# Patient Record
Sex: Male | Born: 1975 | Race: Black or African American | Hispanic: No | Marital: Married | Smoking: Never smoker
Health system: Southern US, Community
[De-identification: ages and names within clinical notes are randomized; demographics above are authoritative.]

## PROBLEM LIST (undated history)

## (undated) DIAGNOSIS — E785 Hyperlipidemia, unspecified: Secondary | ICD-10-CM

## (undated) DIAGNOSIS — G4733 Obstructive sleep apnea (adult) (pediatric): Secondary | ICD-10-CM

## (undated) DIAGNOSIS — G479 Sleep disorder, unspecified: Secondary | ICD-10-CM

## (undated) DIAGNOSIS — Z9989 Dependence on other enabling machines and devices: Secondary | ICD-10-CM

## (undated) DIAGNOSIS — I10 Essential (primary) hypertension: Secondary | ICD-10-CM

## (undated) DIAGNOSIS — E119 Type 2 diabetes mellitus without complications: Secondary | ICD-10-CM

## (undated) DIAGNOSIS — R7302 Impaired glucose tolerance (oral): Secondary | ICD-10-CM

## (undated) DIAGNOSIS — G4730 Sleep apnea, unspecified: Secondary | ICD-10-CM

## (undated) HISTORY — DX: Impaired glucose tolerance (oral): R73.02

## (undated) HISTORY — DX: Dependence on other enabling machines and devices: Z99.89

## (undated) HISTORY — DX: Obstructive sleep apnea (adult) (pediatric): G47.33

## (undated) HISTORY — DX: Sleep apnea, unspecified: G47.30

## (undated) HISTORY — DX: Sleep disorder, unspecified: G47.9

## (undated) HISTORY — PX: NO PAST SURGERIES: SHX2092

## (undated) HISTORY — DX: Type 2 diabetes mellitus without complications: E11.9

## (undated) HISTORY — DX: Essential (primary) hypertension: I10

## (undated) HISTORY — DX: Hyperlipidemia, unspecified: E78.5

---

## 1999-02-11 ENCOUNTER — Emergency Department (HOSPITAL_COMMUNITY): Admission: EM | Admit: 1999-02-11 | Discharge: 1999-02-11 | Payer: Self-pay | Admitting: Emergency Medicine

## 1999-02-11 ENCOUNTER — Encounter: Payer: Self-pay | Admitting: Emergency Medicine

## 2006-02-12 ENCOUNTER — Emergency Department (HOSPITAL_COMMUNITY): Admission: EM | Admit: 2006-02-12 | Discharge: 2006-02-12 | Payer: Self-pay | Admitting: Emergency Medicine

## 2011-09-18 ENCOUNTER — Other Ambulatory Visit: Payer: Self-pay | Admitting: Physician Assistant

## 2011-12-19 ENCOUNTER — Other Ambulatory Visit: Payer: Self-pay | Admitting: Internal Medicine

## 2012-01-14 ENCOUNTER — Ambulatory Visit (INDEPENDENT_AMBULATORY_CARE_PROVIDER_SITE_OTHER): Payer: Managed Care, Other (non HMO) | Admitting: Family Medicine

## 2012-01-14 VITALS — BP 116/84 | HR 78 | Temp 98.2°F | Resp 16 | Ht 63.0 in | Wt 186.6 lb

## 2012-01-14 DIAGNOSIS — I10 Essential (primary) hypertension: Secondary | ICD-10-CM

## 2012-01-14 MED ORDER — POTASSIUM CHLORIDE 20 MEQ PO PACK: 20.0000 meq | PACK | Freq: Every day | ORAL | Status: DC

## 2012-01-14 MED ORDER — LABETALOL HCL 200 MG PO TABS: 200.0000 mg | ORAL_TABLET | Freq: Two times a day (BID) | ORAL | Status: DC

## 2012-01-14 MED ORDER — LISINOPRIL-HYDROCHLOROTHIAZIDE 20-12.5 MG PO TABS: 1.0000 | ORAL_TABLET | Freq: Every day | ORAL | Status: DC

## 2012-01-14 MED ORDER — AMLODIPINE BESYLATE 10 MG PO TABS: 10.0000 mg | ORAL_TABLET | Freq: Every day | ORAL | Status: DC

## 2012-01-14 NOTE — Addendum Note (Signed)
Addended by: Bronson Curb on: 01/14/2012 01:03 PM   Modules accepted: Orders

## 2012-01-14 NOTE — Patient Instructions (Addendum)

## 2012-01-14 NOTE — Progress Notes (Signed)
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 Comes in for blood pressure check and refill on his medication.  He denies shortness of breath, chest pain, or edema.   Objective: No acute distress Heart: Regular no murmur or gallop Chest: Clear Abdomen soft nontender no HSM or masses mildly protuberant Extremities: No edema Skin: Clear of rashes  Assessment: Stable blood pressure on current medications  Plan: Refill meds and check a metabolic profile

## 2012-01-14 NOTE — Addendum Note (Signed)
Addended by: Bronson Curb on: 01/14/2012 01:14 PM   Modules accepted: Orders

## 2012-01-15 ENCOUNTER — Other Ambulatory Visit: Payer: Self-pay | Admitting: Family Medicine

## 2012-01-15 DIAGNOSIS — E876 Hypokalemia: Secondary | ICD-10-CM

## 2012-01-15 LAB — COMPREHENSIVE METABOLIC PANEL
ALT: 53 U/L (ref 0–53)
AST: 26 U/L (ref 0–37)
Albumin: 4.7 g/dL (ref 3.5–5.2)
Alkaline Phosphatase: 77 U/L (ref 39–117)
BUN: 9 mg/dL (ref 6–23)
CO2: 32 mEq/L (ref 19–32)
Calcium: 10 mg/dL (ref 8.4–10.5)
Chloride: 103 mEq/L (ref 96–112)
Creat: 0.95 mg/dL (ref 0.50–1.35)
Glucose, Bld: 96 mg/dL (ref 70–99)
Potassium: 3.3 mEq/L — ABNORMAL LOW (ref 3.5–5.3)
Sodium: 142 mEq/L (ref 135–145)
Total Bilirubin: 0.4 mg/dL (ref 0.3–1.2)
Total Protein: 7.5 g/dL (ref 6.0–8.3)

## 2012-01-15 MED ORDER — POTASSIUM CHLORIDE CRYS ER 20 MEQ PO TBCR: 20.0000 meq | EXTENDED_RELEASE_TABLET | Freq: Every day | ORAL | Status: DC

## 2012-02-26 ENCOUNTER — Other Ambulatory Visit: Payer: Self-pay | Admitting: Internal Medicine

## 2012-05-06 ENCOUNTER — Other Ambulatory Visit: Payer: Self-pay | Admitting: Radiology

## 2012-05-06 DIAGNOSIS — E876 Hypokalemia: Secondary | ICD-10-CM

## 2012-05-06 MED ORDER — POTASSIUM CHLORIDE CRYS ER 20 MEQ PO TBCR: 20.0000 meq | EXTENDED_RELEASE_TABLET | Freq: Every day | ORAL | Status: DC

## 2012-05-08 ENCOUNTER — Other Ambulatory Visit: Payer: Self-pay | Admitting: Family Medicine

## 2012-06-04 ENCOUNTER — Other Ambulatory Visit: Payer: Self-pay | Admitting: Family Medicine

## 2012-07-23 DIAGNOSIS — G4733 Obstructive sleep apnea (adult) (pediatric): Secondary | ICD-10-CM

## 2012-07-23 HISTORY — DX: Obstructive sleep apnea (adult) (pediatric): G47.33

## 2012-07-24 ENCOUNTER — Other Ambulatory Visit: Payer: Self-pay | Admitting: Family Medicine

## 2012-08-06 ENCOUNTER — Ambulatory Visit (INDEPENDENT_AMBULATORY_CARE_PROVIDER_SITE_OTHER): Payer: Managed Care, Other (non HMO) | Admitting: Family Medicine

## 2012-08-06 ENCOUNTER — Other Ambulatory Visit: Payer: Self-pay | Admitting: Family Medicine

## 2012-08-06 VITALS — BP 130/86 | HR 87 | Temp 98.8°F | Resp 16 | Ht 63.0 in | Wt 186.2 lb

## 2012-08-06 DIAGNOSIS — E876 Hypokalemia: Secondary | ICD-10-CM

## 2012-08-06 DIAGNOSIS — I10 Essential (primary) hypertension: Secondary | ICD-10-CM | POA: Insufficient documentation

## 2012-08-06 DIAGNOSIS — Z23 Encounter for immunization: Secondary | ICD-10-CM

## 2012-08-06 LAB — POCT CBC
Granulocyte percent: 46.1 %G (ref 37–80)
Lymph, poc: 1.7 (ref 0.6–3.4)
MID (cbc): 0.3 (ref 0–0.9)
POC Granulocyte: 1.7 — AB (ref 2–6.9)
POC LYMPH PERCENT: 46.7 %L (ref 10–50)
POC MID %: 7.2 %M (ref 0–12)
Platelet Count, POC: 223 10*3/uL (ref 142–424)
WBC: 3.7 10*3/uL — AB (ref 4.6–10.2)

## 2012-08-06 LAB — COMPREHENSIVE METABOLIC PANEL
ALT: 38 U/L (ref 0–53)
AST: 22 U/L (ref 0–37)
Albumin: 4.7 g/dL (ref 3.5–5.2)
Alkaline Phosphatase: 77 U/L (ref 39–117)
BUN: 11 mg/dL (ref 6–23)
CO2: 31 mEq/L (ref 19–32)
Calcium: 9.6 mg/dL (ref 8.4–10.5)
Creat: 0.97 mg/dL (ref 0.50–1.35)
Sodium: 142 mEq/L (ref 135–145)
Total Protein: 7.1 g/dL (ref 6.0–8.3)

## 2012-08-06 LAB — LIPID PANEL
Triglycerides: 82 mg/dL (ref <150)
VLDL: 16 mg/dL (ref 0–40)

## 2012-08-06 MED ORDER — LABETALOL HCL 200 MG PO TABS: 200.0000 mg | ORAL_TABLET | Freq: Two times a day (BID) | ORAL | Status: DC

## 2012-08-06 MED ORDER — LISINOPRIL-HYDROCHLOROTHIAZIDE 20-12.5 MG PO TABS: 1.0000 | ORAL_TABLET | Freq: Every day | ORAL | Status: DC

## 2012-08-06 MED ORDER — AMLODIPINE BESYLATE 10 MG PO TABS: 10.0000 mg | ORAL_TABLET | Freq: Every day | ORAL | Status: DC

## 2012-08-06 MED ORDER — POTASSIUM CHLORIDE CRYS ER 20 MEQ PO TBCR: 20.0000 meq | EXTENDED_RELEASE_TABLET | Freq: Every day | ORAL | Status: DC

## 2012-08-06 NOTE — Patient Instructions (Addendum)
Please work on losing weight through a lower fat diet with lots of fruits and vegetables, and exercise!  Try to check your blood pressure once a week or so and write down the result.  If your BP is running approximally 120/80 or lower we can try to stop one of your medications. I will be in touch regarding your lab results when they are available.    Take care!

## 2012-08-06 NOTE — Progress Notes (Signed)
[facility] [address], [place] [address] [phone]  Date:  08/06/2012   Name:  Ricky Diaz   DOB:  01/23/1976   MRN:  [id number]  PCP:  No primary provider on file.    Chief Complaint: Medication Refill   History of Present Illness:  Ricky Diaz is a 37 y.o. very pleasant male patient who presents with the following:  He is here today to follow- up HTN.  He was last seen here in June 2013.   He takes 4 antihypertensives plus potassium.  He is feeling well.   His K was slightly low in June.    He is fasting today.  He does not check his BP at home.   Eunice would like to stop some of his medications.  However, he know that he needs to lose some weight as this might also help control his BP  Patient Active Problem List  Diagnosis  . HTN (hypertension)    Past Medical History  Diagnosis Date  . Clotting disorder     History reviewed. No pertinent past surgical history.  History  Substance Use Topics  . Smoking status: Never Smoker   . Smokeless tobacco: Not on file  . Alcohol Use: No    History reviewed. No pertinent family history.  No Known Allergies  Medication list has been reviewed and updated.  Current Outpatient Prescriptions on File Prior to Visit  Medication Sig Dispense Refill  . amLODipine (NORVASC) 10 MG tablet Take 1 tablet (10 mg total) by mouth daily.  90 tablet  1  . labetalol (NORMODYNE) 200 MG tablet TAKE 1 TABLET (200 MG TOTAL) BY MOUTH 2 (TWO) TIMES DAILY. NEEDS OFFICE VISIT/LABS  60 tablet  0  . lisinopril-hydrochlorothiazide (PRINZIDE,ZESTORETIC) 20-12.5 MG per tablet Take 1 tablet by mouth daily.  90 tablet  1  . potassium chloride SA (K-DUR,KLOR-CON) 20 MEQ tablet Take 1 tablet (20 mEq total) by mouth daily.  90 tablet  0  . potassium chloride (KLOR-CON) 20 MEQ packet Take 20 mEq by mouth daily.  90 tablet  1    Review of Systems:  As per HPI- otherwise negative.   Physical  Examination: Filed Vitals:   08/06/12 1158  BP: 130/86  Pulse: 87  Temp: 98.8 F (37.1 C)  Resp: 16   Filed Vitals:   08/06/12 1158  Height: 5\' 3"  (1.6 m)  Weight: 186 lb 3.2 oz (84.46 kg)   Body mass index is 32.98 kg/(m^2). Ideal Body Weight: Weight in (lb) to have BMI = 25: 140.8   GEN: WDWN, NAD, Non-toxic, A & O x 3, overweight HEENT: Atraumatic, Normocephalic. Neck supple. No masses, No LAD. Ears and Nose: No external deformity. CV: RRR, No M/G/R. No JVD. No thrill. No extra heart sounds. PULM: CTA B, no wheezes, crackles, rhonchi. No retractions. No resp. distress. No accessory muscle use. ABD: S, NT, ND. No rebound. No HSM. EXTR: No c/c/e NEURO Normal gait.  PSYCH: Normally interactive. Conversant. Not depressed or anxious appearing.  Calm demeanor.   Flu shot today Assessment and Plan: 1. Hypertension  amLODipine (NORVASC) 10 MG tablet, labetalol (NORMODYNE) 200 MG tablet, lisinopril-hydrochlorothiazide (PRINZIDE,ZESTORETIC) 20-12.5 MG per tablet, POCT CBC, Comprehensive metabolic panel, Lipid panel  2. Hypokalemia  potassium chloride SA (K-DUR,KLOR-CON) 20 MEQ tablet, Comprehensive metabolic panel   BP controlled with several BP medications.   See patient instructions for more details.    Meds ordered this encounter  Medications  .  amLODipine (NORVASC) 10 MG tablet    Sig: Take 1 tablet (10 mg total) by mouth daily.    Dispense:  90 tablet    Refill:  2  . labetalol (NORMODYNE) 200 MG tablet    Sig: Take 1 tablet (200 mg total) by mouth 2 (two) times daily.    Dispense:  180 tablet    Refill:  2  . lisinopril-hydrochlorothiazide (PRINZIDE,ZESTORETIC) 20-12.5 MG per tablet    Sig: Take 1 tablet by mouth daily.    Dispense:  90 tablet    Refill:  2  . potassium chloride SA (K-DUR,KLOR-CON) 20 MEQ tablet    Sig: Take 1 tablet (20 mEq total) by mouth daily.    Dispense:  90 tablet    Refill:  2      Hollie Wojahn, MD

## 2012-08-11 MED ORDER — LISINOPRIL 20 MG PO TABS: 20.0000 mg | ORAL_TABLET | Freq: Every day | ORAL | Status: DC

## 2012-08-11 NOTE — Addendum Note (Signed)
Addended by: Abbe Amsterdam C on: 08/11/2012 03:14 PM   Modules accepted: Orders

## 2012-08-11 NOTE — Progress Notes (Signed)
Was able to contact him by phone today regarding his labs;  Results for orders placed in visit on 08/06/12  POCT CBC      Component Value Range   WBC 3.7 (*) 4.6 - 10.2 K/uL   Lymph, poc 1.7  0.6 - 3.4   POC LYMPH PERCENT 46.7  10 - 50 %L   MID (cbc) 0.3  0 - 0.9   POC MID % 7.2  0 - 12 %M   POC Granulocyte 1.7 (*) 2 - 6.9   Granulocyte percent 46.1  37 - 80 %G   RBC 4.47 (*) 4.69 - 6.13 M/uL   Hemoglobin 12.7 (*) 14.1 - 18.1 g/dL   HCT, POC 11.9 (*) 14.7 - 53.7 %   MCV 91.9  80 - 97 fL   MCH, POC 28.4  27 - 31.2 pg   MCHC 30.9 (*) 31.8 - 35.4 g/dL   RDW, POC 82.9     Platelet Count, POC 223  142 - 424 K/uL   MPV 9.9  0 - 99.8 fL  COMPREHENSIVE METABOLIC PANEL      Component Value Range   Sodium 142  135 - 145 mEq/L   Potassium 3.0 (*) 3.5 - 5.3 mEq/L   Chloride 103  96 - 112 mEq/L   CO2 31  19 - 32 mEq/L   Glucose, Bld 87  70 - 99 mg/dL   BUN 11  6 - 23 mg/dL   Creat 5.62  1.30 - 8.65 mg/dL   Total Bilirubin 0.6  0.3 - 1.2 mg/dL   Alkaline Phosphatase 77  39 - 117 U/L   AST 22  0 - 37 U/L   ALT 38  0 - 53 U/L   Total Protein 7.1  6.0 - 8.3 g/dL   Albumin 4.7  3.5 - 5.2 g/dL   Calcium 9.6  8.4 - 78.4 mg/dL  LIPID PANEL      Component Value Range   Cholesterol 199  0 - 200 mg/dL   Triglycerides 82  <696 mg/dL   HDL 48  >29 mg/dL   Total CHOL/HDL Ratio 4.1     VLDL 16  0 - 40 mg/dL   LDL Cholesterol 528 (*) 0 - 99 mg/dL   His potassium has gotten lower.  We are going to  1.have him take his potassium twice a day for one week 2. D/c lisinopril/ hctz and do lisinopril only.  3. Recheck in 2 weeks- K and CBC only

## 2012-08-12 ENCOUNTER — Encounter: Payer: Self-pay | Admitting: Family Medicine

## 2012-08-25 ENCOUNTER — Other Ambulatory Visit: Payer: Self-pay | Admitting: Family Medicine

## 2012-09-03 ENCOUNTER — Other Ambulatory Visit: Payer: Self-pay | Admitting: Family Medicine

## 2013-01-07 ENCOUNTER — Other Ambulatory Visit: Payer: Self-pay | Admitting: Physician Assistant

## 2013-02-02 ENCOUNTER — Ambulatory Visit (INDEPENDENT_AMBULATORY_CARE_PROVIDER_SITE_OTHER): Payer: Managed Care, Other (non HMO) | Admitting: Family Medicine

## 2013-02-02 VITALS — BP 131/85 | HR 80 | Temp 98.1°F | Resp 18 | Ht 64.0 in | Wt 186.0 lb

## 2013-02-02 DIAGNOSIS — I10 Essential (primary) hypertension: Secondary | ICD-10-CM

## 2013-02-02 DIAGNOSIS — Z862 Personal history of diseases of the blood and blood-forming organs and certain disorders involving the immune mechanism: Secondary | ICD-10-CM

## 2013-02-02 DIAGNOSIS — Z8639 Personal history of other endocrine, nutritional and metabolic disease: Secondary | ICD-10-CM

## 2013-02-02 LAB — POCT CBC
HCT, POC: 43.9 % (ref 43.5–53.7)
MCHC: 31 g/dL — AB (ref 31.8–35.4)
MPV: 10.3 fL (ref 0–99.8)
POC Granulocyte: 2.8 (ref 2–6.9)
POC MID %: 8.1 %M (ref 0–12)
Platelet Count, POC: 202 10*3/uL (ref 142–424)

## 2013-02-02 LAB — LIPID PANEL
LDL Cholesterol: 137 mg/dL — ABNORMAL HIGH (ref 0–99)
Total CHOL/HDL Ratio: 4.2 Ratio
Triglycerides: 90 mg/dL (ref <150)

## 2013-02-02 LAB — COMPREHENSIVE METABOLIC PANEL
Alkaline Phosphatase: 78 U/L (ref 39–117)
BUN: 10 mg/dL (ref 6–23)
Glucose, Bld: 102 mg/dL — ABNORMAL HIGH (ref 70–99)
Sodium: 144 mEq/L (ref 135–145)
Total Protein: 7.4 g/dL (ref 6.0–8.3)

## 2013-02-02 NOTE — Progress Notes (Signed)
[facility] [address], [place] [address] [phone]  Date:  02/02/2013   Name:  Ricky Diaz   DOB:  March 13, 1976   MRN:  [id number]  PCP:  No primary provider on file.    Chief Complaint: Medication Refill   History of Present Illness:  Ricky Diaz is a 37 y.o. very pleasant male patient who presents with the following:  Here today for a BP check/ medication refill. Last seen here in January of this year.   He was supposed to come in for recheck labs in January (low potassium, anemia, leukopenia), but we did not see him here.  He did not have these done elsewhere either  He is currently on lisinopril. Labetolol, norvase and Kdur daily.   He is fasting today for labs Overall he is feeling well and has no other worries today  Patient Active Problem List   Diagnosis Date Noted  . HTN (hypertension) 08/06/2012    Past Medical History  Diagnosis Date  . Clotting disorder   . Hypertension     History reviewed. No pertinent past surgical history.  History  Substance Use Topics  . Smoking status: Never Smoker   . Smokeless tobacco: Not on file  . Alcohol Use: No    History reviewed. No pertinent family history.  No Known Allergies  Medication list has been reviewed and updated.  Current Outpatient Prescriptions on File Prior to Visit  Medication Sig Dispense Refill  . amLODipine (NORVASC) 10 MG tablet Take 1 tablet (10 mg total) by mouth daily.  90 tablet  2  . labetalol (NORMODYNE) 200 MG tablet Take 1 tablet (200 mg total) by mouth 2 (two) times daily.  180 tablet  2  . lisinopril (PRINIVIL,ZESTRIL) 20 MG tablet Take 1 tablet (20 mg total) by mouth daily.  90 tablet  3  . potassium chloride SA (K-DUR,KLOR-CON) 20 MEQ tablet Take 1 tablet (20 mEq total) by mouth daily.  90 tablet  2  . potassium chloride SA (K-DUR,KLOR-CON) 20 MEQ tablet Take 1 tablet (20 mEq total) by mouth daily. Needs office visit and labs  30 tablet  0    No current facility-administered medications on file prior to visit.    Review of Systems:  As per HPI- otherwise negative.   Physical Examination: Filed Vitals:   02/02/13 0935  BP: 131/85  Pulse: 80  Temp: 98.1 F (36.7 C)  Resp: 18   Filed Vitals:   02/02/13 0935  Height: 5\' 4"  (1.626 m)  Weight: 186 lb (84.369 kg)   Body mass index is 31.91 kg/(m^2). Ideal Body Weight: Weight in (lb) to have BMI = 25: 145.3  GEN: WDWN, NAD, Non-toxic, A & O x 3, looks well, overweight  HEENT: Atraumatic, Normocephalic. Neck supple. No masses, No LAD. Ears and Nose: No external deformity. CV: RRR, No M/G/R. No JVD. No thrill. No extra heart sounds. PULM: CTA B, no wheezes, crackles, rhonchi. No retractions. No resp. distress. No accessory muscle use. ABD: S, NT, ND, +BS. No rebound. No HSM. EXTR: No c/c/e NEURO Normal gait.  PSYCH: Normally interactive. Conversant. Not depressed or anxious appearing.  Calm demeanor.   Results for orders placed in visit on 02/02/13  POCT CBC      Result Value Range   WBC 4.5 (*) 4.6 - 10.2 K/uL   Lymph, poc 1.3  0.6 - 3.4   POC LYMPH PERCENT 30.0  10 - 50 %L  MID (cbc) 0.4  0 - 0.9   POC MID % 8.1  0 - 12 %M   POC Granulocyte 2.8  2 - 6.9   Granulocyte percent 61.9  37 - 80 %G   RBC 4.71  4.69 - 6.13 M/uL   Hemoglobin 13.6 (*) 14.1 - 18.1 g/dL   HCT, POC 08.6  57.8 - 53.7 %   MCV 93.2  80 - 97 fL   MCH, POC 28.9  27 - 31.2 pg   MCHC 31.0 (*) 31.8 - 35.4 g/dL   RDW, POC 46.9     Platelet Count, POC 202  142 - 424 K/uL   MPV 10.3  0 - 99.8 fL     Assessment and Plan: HTN (hypertension) - Plan: POCT CBC, Comprehensive metabolic panel, Lipid panel  BP ok today without HCTZ.  Will see if he still needs to be on his potassium now that he is off HCTZ. Also recheck his CBC for anemia today.    Signed Abbe Amsterdam, MD

## 2013-02-02 NOTE — Patient Instructions (Addendum)
Wait until you hear from me regarding your potassium pills- you may not need to take these any more.  Your blood pressure looks good today.  Continue on your other blood pressure medications

## 2013-02-05 ENCOUNTER — Telehealth: Payer: Self-pay

## 2013-02-05 NOTE — Telephone Encounter (Signed)
Pt advised about potassium, he already takes potassium daily please advise.

## 2013-02-11 ENCOUNTER — Other Ambulatory Visit: Payer: Self-pay | Admitting: Physician Assistant

## 2013-02-11 ENCOUNTER — Encounter: Payer: Self-pay | Admitting: Family Medicine

## 2013-02-12 ENCOUNTER — Other Ambulatory Visit: Payer: Self-pay | Admitting: Physician Assistant

## 2013-02-13 NOTE — Telephone Encounter (Signed)
Dr Patsy Lager, I see that you LM for pt to RTC to discuss K+ supplement. Do you want to RF this Rx as is, or alter it since pt's K+ level has decreased more?

## 2013-03-13 ENCOUNTER — Other Ambulatory Visit: Payer: Self-pay | Admitting: Physician Assistant

## 2013-03-13 NOTE — Telephone Encounter (Signed)
Needs labs to recheck K

## 2013-03-18 ENCOUNTER — Other Ambulatory Visit: Payer: Self-pay | Admitting: Physician Assistant

## 2013-04-12 ENCOUNTER — Ambulatory Visit: Payer: Managed Care, Other (non HMO)

## 2013-04-12 ENCOUNTER — Ambulatory Visit (INDEPENDENT_AMBULATORY_CARE_PROVIDER_SITE_OTHER): Payer: Managed Care, Other (non HMO) | Admitting: Physician Assistant

## 2013-04-12 VITALS — BP 122/90 | HR 91 | Temp 99.3°F | Resp 18 | Ht 63.5 in | Wt 185.8 lb

## 2013-04-12 DIAGNOSIS — J189 Pneumonia, unspecified organism: Secondary | ICD-10-CM

## 2013-04-12 DIAGNOSIS — R05 Cough: Secondary | ICD-10-CM

## 2013-04-12 DIAGNOSIS — R062 Wheezing: Secondary | ICD-10-CM

## 2013-04-12 LAB — COMPREHENSIVE METABOLIC PANEL
ALT: 40 U/L (ref 0–53)
AST: 27 U/L (ref 0–37)
Albumin: 3.8 g/dL (ref 3.5–5.2)
Alkaline Phosphatase: 90 U/L (ref 39–117)
Calcium: 9.4 mg/dL (ref 8.4–10.5)
Potassium: 3.4 mEq/L — ABNORMAL LOW (ref 3.5–5.3)
Total Bilirubin: 0.3 mg/dL (ref 0.3–1.2)
Total Protein: 7.1 g/dL (ref 6.0–8.3)

## 2013-04-12 LAB — POCT CBC
HCT, POC: 39.2 % — AB (ref 43.5–53.7)
MID (cbc): 0.4 (ref 0–0.9)
POC LYMPH PERCENT: 21.5 %L (ref 10–50)
RBC: 4.2 M/uL — AB (ref 4.69–6.13)
RDW, POC: 15.3 %

## 2013-04-12 LAB — HIV ANTIBODY (ROUTINE TESTING W REFLEX): HIV: NONREACTIVE

## 2013-04-12 MED ORDER — ALBUTEROL SULFATE (2.5 MG/3ML) 0.083% IN NEBU
2.5000 mg | INHALATION_SOLUTION | Freq: Once | RESPIRATORY_TRACT | Status: AC
  Administered 2013-04-12: 2.5 mg via RESPIRATORY_TRACT

## 2013-04-12 MED ORDER — CEFTRIAXONE SODIUM 1 G IJ SOLR: 1.0000 g | Freq: Once | INTRAMUSCULAR | Status: DC

## 2013-04-12 MED ORDER — ALBUTEROL SULFATE HFA 108 (90 BASE) MCG/ACT IN AERS: 2.0000 | INHALATION_SPRAY | RESPIRATORY_TRACT | Status: DC | PRN

## 2013-04-12 MED ORDER — CEFTRIAXONE SODIUM 1 G IJ SOLR
1.0000 g | Freq: Once | INTRAMUSCULAR | Status: AC
  Administered 2013-04-12: 1 g via INTRAMUSCULAR

## 2013-04-12 MED ORDER — IPRATROPIUM BROMIDE 0.02 % IN SOLN
0.5000 mg | Freq: Once | RESPIRATORY_TRACT | Status: AC
  Administered 2013-04-12: 0.5 mg via RESPIRATORY_TRACT

## 2013-04-12 MED ORDER — HYDROCODONE-HOMATROPINE 5-1.5 MG/5ML PO SYRP: ORAL_SOLUTION | ORAL | Status: DC

## 2013-04-12 MED ORDER — AZITHROMYCIN 500 MG PO TABS: 500.0000 mg | ORAL_TABLET | Freq: Every day | ORAL | Status: DC

## 2013-04-12 NOTE — Progress Notes (Signed)
Patient ID: Ricky Diaz MRN: [id number], DOB: May 01, 1976, 37 y.o. Date of Encounter: 04/12/2013, 9:31 AM  Primary Physician: No primary provider on file.  Chief Complaint:  Chief Complaint  Patient presents with  . URI    Cough-dry;chest congestion;wheezing x yesterday    HPI: 37 y.o. male presents with a 2 day history of sinus pressure, sore throat, and cough. Subjective fever and chills. No nasal congestion. Cough is productive of green/yellow sputum and not associated with time of day. No subjective SOB or wheezing. Will get into some bad coughing episodes. Sore from coughing. No frank chest pain or chest tightness. Normal hearing. Has not yet tried any OTC preps. No GI complaints. Appetite normal. Multiple sick contacts at work with similar illness. Cough is similar to previous illnesses.  No recent antibiotics, or recent travels.   No leg trauma, sedentary periods, h/o cancer, or tobacco use.  Born in [place]. Moved to the [place] 14 years ago. Married to his wife. States "I am a christian." He is faithful to his wife. No ETOH, tobacco, or illicit drugs including IV drug use. No tattoos. No weight loss. No night sweats.     Past Medical History  Diagnosis Date  . Clotting disorder   . Hypertension      Home Meds: Prior to Admission medications   Medication Sig Start Date End Date Taking? Authorizing Provider  amLODipine (NORVASC) 10 MG tablet Take 1 tablet (10 mg total) by mouth daily. 08/06/12  Yes Gwenlyn Found Copland, MD  labetalol (NORMODYNE) 200 MG tablet Take 1 tablet (200 mg total) by mouth 2 (two) times daily. 08/06/12  Yes Gwenlyn Found Copland, MD  lisinopril (PRINIVIL,ZESTRIL) 20 MG tablet Take 1 tablet (20 mg total) by mouth daily. 08/11/12  Yes Gwenlyn Found Copland, MD  potassium chloride SA (KLOR-CON M20) 20 MEQ tablet Take 1 tablet (20 mEq total) by mouth daily. NEED LABS!! 03/13/13   Godfrey Pick, PA-C    Allergies: No Known Allergies  History   Social History  .  Marital Status: Married    Spouse Name: N/A    Number of Children: N/A  . Years of Education: N/A   Occupational History  . Not on file.   Social History Main Topics  . Smoking status: Never Smoker   . Smokeless tobacco: Not on file  . Alcohol Use: No  . Drug Use: Not on file  . Sexual Activity: Yes    Partners: Female   Other Topics Concern  . Not on file   Social History Narrative  . No narrative on file     Review of Systems: Constitutional: positive for fever, chills, and fatigue. Negative for weight loss and night sweats  HEENT: see above Cardiovascular: negative for chest pain, chest tightness, orthopnea, edema, or palpitations Respiratory: positive for cough. negative for hemoptysis, wheezing, or shortness of breath Abdominal: negative for abdominal pain, nausea, vomiting or diarrhea Dermatological: negative for rash Neurologic: negative for headache   Physical Exam: Blood pressure 122/90, pulse 91, temperature 99.3 F (37.4 C), temperature source Oral, resp. rate 18, height 5' 3.5" (1.613 m), weight 185 lb 12.8 oz (84.278 kg), SpO2 95.00%., Body mass index is 32.39 kg/(m^2). General: Well developed, well nourished, in no acute distress. Head: Normocephalic, atraumatic, eyes without discharge, sclera non-icteric, nares are congested. Bilateral auditory canals clear, TM's are without perforation, pearly grey with reflective cone of light bilaterally. No sinus TTP. Oral cavity moist, dentition normal. Posterior pharynx with mild post  nasal drip and mild erythema. No peritonsillar abscess or tonsillar exudate. Uvula midline.  Neck: Supple. No thyromegaly. Full ROM. No lymphadenopathy. Lungs: Rhonchi right upper lobe. Bilateral wheezing. Breathing is unlabored. Status post albuterol and atrovent nebulizer: patient feels: Somewhat better.  Heart: RRR with S1 S2. No murmurs, rubs, or gallops appreciated. Msk:  Strength and tone normal for age. Extremities: No clubbing or  cyanosis. No edema. Neuro: Alert and oriented X 3. Moves all extremities spontaneously. CNII-XII grossly in tact. Psych:  Responds to questions appropriately with a normal affect.   Labs: Results for orders placed in visit on 04/12/13  POCT CBC      Result Value Range   WBC 5.2  4.6 - 10.2 K/uL   Lymph, poc 1.1  0.6 - 3.4   POC LYMPH PERCENT 21.5  10 - 50 %L   MID (cbc) 0.4  0 - 0.9   POC MID % 7.8  0 - 12 %M   POC Granulocyte 3.7  2 - 6.9   Granulocyte percent 70.7  37 - 80 %G   RBC 4.20 (*) 4.69 - 6.13 M/uL   Hemoglobin 12.1 (*) 14.1 - 18.1 g/dL   HCT, POC 16.1 (*) 09.6 - 53.7 %   MCV 93.3  80 - 97 fL   MCH, POC 28.8  27 - 31.2 pg   MCHC 30.9 (*) 31.8 - 35.4 g/dL   RDW, POC 04.5     Platelet Count, POC 211  142 - 424 K/uL   MPV 9.3  0 - 99.8 fL   HIV and CMP pending.   [facility] reading (PRIMARY) by  Dr. Perrin Maltese. Right upper lobe pneumonia.   ASSESSMENT AND PLAN:  37 y.o. male with pneumonia and cough.  -Rocephin 1 gram IM -Azithromycin 500 mg 1 po daily #5 no RF -Proventil 2 puffs inhaled q 4-6 hours prn #1 no RF -Hycodan #4oz 1 tsp po q 4-6 hours prn cough no RF SED -PPD placed, RTC 48-72 hours for reading -Mucinex -Tylenol/Motrin prn -Rest/fluids -Recheck 24 hours -RTC precautions -Await labs -Although his PMH states clotting disorder I do not see anything in epic or in his paper chart that indicates to this effect. The patient also states that he has never had a blood clot or a clotting disorder.   Signed, Eula Listen, PA-C 04/12/2013 9:31 AM

## 2013-04-13 ENCOUNTER — Encounter: Payer: Self-pay | Admitting: Family Medicine

## 2013-04-13 ENCOUNTER — Ambulatory Visit (INDEPENDENT_AMBULATORY_CARE_PROVIDER_SITE_OTHER): Payer: Managed Care, Other (non HMO) | Admitting: Physician Assistant

## 2013-04-13 VITALS — BP 130/82 | HR 100 | Temp 98.2°F | Resp 17 | Ht 63.0 in | Wt 182.0 lb

## 2013-04-13 DIAGNOSIS — R05 Cough: Secondary | ICD-10-CM

## 2013-04-13 DIAGNOSIS — J189 Pneumonia, unspecified organism: Secondary | ICD-10-CM

## 2013-04-13 DIAGNOSIS — R059 Cough, unspecified: Secondary | ICD-10-CM

## 2013-04-13 NOTE — Progress Notes (Signed)
  Subjective:    Patient ID: Ricky Diaz, male    DOB: 28-May-1976, 37 y.o.   MRN: [id number]  HPI 37 year old male presents for recheck of right upper lobe pneumonia diagnosed yesterday on 9/21.  Patient reports significant improvement in symptoms today - he overall feels much better.  He received Rocephin 1 gram yesterday and has started his Zpack as well.  Has been using the albuterol inhaler as prescribed and also the Hycadan at night.    Still complains of cough, fatigue, and decreased appetite.  Denies fever, chills, hemoptysis, nausea, or vomiting.    Plans to return tomorrow for TB read.  HIV test negative.    He did pick up Rocephin from pharmacy and did not know what to do with it.    Review of Systems  Constitutional: Positive for fatigue. Negative for fever and chills.  HENT: Negative for congestion.   Respiratory: Positive for cough, chest tightness and shortness of breath. Negative for wheezing.   Gastrointestinal: Negative for nausea and vomiting.  Neurological: Negative for dizziness and headaches.       Objective:   Physical Exam  Constitutional: He is oriented to person, place, and time. He appears well-developed and well-nourished.  HENT:  Head: Normocephalic and atraumatic.  Right Ear: External ear normal.  Left Ear: External ear normal.  Mouth/Throat: Oropharynx is clear and moist.  Eyes: Conjunctivae are normal.  Neck: Normal range of motion.  Cardiovascular: Normal rate, regular rhythm and normal heart sounds.   Pulmonary/Chest: Effort normal and breath sounds normal.  Neurological: He is alert and oriented to person, place, and time.  Psychiatric: He has a normal mood and affect. His behavior is normal. Judgment and thought content normal.          Assessment & Plan:  Pneumonia, organism unspecified  Cough  No wheezing today.  Patient reports improvement in symptoms Discard Rocephin that he received from pharmacy - ordered in error.    Continue albuterol q4-6hours prn wheezing Hycodan qhs prn cough - limit use RTC if symptoms worsen or if fever develops.  Since CBC normal yesterday I did not recheck this today, but recommend doing so if fever develops or symptoms persist.  Recommend repeat CXR in 4-6 weeks.

## 2013-04-14 ENCOUNTER — Ambulatory Visit (INDEPENDENT_AMBULATORY_CARE_PROVIDER_SITE_OTHER): Payer: Managed Care, Other (non HMO) | Admitting: Radiology

## 2013-04-14 DIAGNOSIS — Z111 Encounter for screening for respiratory tuberculosis: Secondary | ICD-10-CM

## 2013-04-14 LAB — TB SKIN TEST: TB Skin Test: NEGATIVE

## 2013-04-14 NOTE — Progress Notes (Signed)
  Subjective:    Patient ID: Ricky Diaz, male    DOB: 1976/04/16, 37 y.o.   MRN: [id number]  HPI Patient here for Tb read on left forearm.  TB test reads 9mm horizontally.  This is a negative reading. Patient aware.   Review of Systems     Objective:   Physical Exam        Assessment & Plan:

## 2013-05-10 ENCOUNTER — Other Ambulatory Visit: Payer: Self-pay | Admitting: Family Medicine

## 2013-05-25 ENCOUNTER — Other Ambulatory Visit: Payer: Self-pay | Admitting: Family Medicine

## 2013-07-24 ENCOUNTER — Ambulatory Visit (INDEPENDENT_AMBULATORY_CARE_PROVIDER_SITE_OTHER): Payer: Managed Care, Other (non HMO) | Admitting: Emergency Medicine

## 2013-07-24 VITALS — BP 124/86 | HR 93 | Temp 99.1°F | Resp 16 | Ht 63.0 in | Wt 184.0 lb

## 2013-07-24 DIAGNOSIS — Z23 Encounter for immunization: Secondary | ICD-10-CM

## 2013-07-24 DIAGNOSIS — J018 Other acute sinusitis: Secondary | ICD-10-CM

## 2013-07-24 MED ORDER — PSEUDOEPHEDRINE-GUAIFENESIN ER 60-600 MG PO TB12: 1.0000 | ORAL_TABLET | Freq: Two times a day (BID) | ORAL | Status: DC

## 2013-07-24 MED ORDER — AMOXICILLIN 500 MG PO CAPS: 500.0000 mg | ORAL_CAPSULE | Freq: Three times a day (TID) | ORAL | Status: DC

## 2013-07-24 NOTE — Progress Notes (Signed)
[facility] [address], [place] [id number] [phone]  Date:  07/24/2013   Name:  Ricky RaveKomivi E Basso   DOB:  [birth year]   MRN:  [id number]  PCP:  No primary provider on file.    Chief Complaint: Nasal Congestion   History of Present Illness:  [name] is a 38 y.o. very pleasant male patient who presents with the following:  Ill for a week with mucopurulent nasal discharge and congestion.  No fever or chills. No cough, wheezing or shortness of breath.  Some malaise and fatigue.  No nausea or vomiting.  No stool change or rash. No improvement with over the counter medications or other home remedies. Denies other complaint or health concern today.   Patient Active Problem List   Diagnosis Date Noted  . HTN (hypertension) 08/06/2012    Past Medical History  Diagnosis Date  . Hypertension     History reviewed. No pertinent past surgical history.  History  Substance Use Topics  . Smoking status: Never Smoker   . Smokeless tobacco: Not on file  . Alcohol Use: No    History reviewed. No pertinent family history.  No Known Allergies  Medication list has been reviewed and updated.  Current Outpatient Prescriptions on File Prior to Visit  Medication Sig Dispense Refill  . albuterol (PROVENTIL HFA;VENTOLIN HFA) 108 (90 BASE) MCG/ACT inhaler Inhale 2 puffs into the lungs every 4 (four) hours as needed for wheezing.  1 Inhaler  1  . amLODipine (NORVASC) 10 MG tablet TAKE 1 TABLET (10 MG TOTAL) BY MOUTH DAILY.  90 tablet  0  . labetalol (NORMODYNE) 200 MG tablet TAKE 1 TABLET (200 MG TOTAL) BY MOUTH 2 (TWO) TIMES DAILY.  180 tablet  0  . lisinopril (PRINIVIL,ZESTRIL) 20 MG tablet Take 1 tablet (20 mg total) by mouth daily.  90 tablet  3  . potassium chloride SA (KLOR-CON M20) 20 MEQ tablet Take 1 tablet (20 mEq total) by mouth daily. NEED LABS!!  30 tablet  0   No current facility-administered medications on file prior to visit.    Review of  Systems:  As per HPI, otherwise negative.    Physical Examination: Filed Vitals:   07/24/13 1004  BP: 124/86  Pulse: 93  Temp: 99.1 F (37.3 C)  Resp: 16   Filed Vitals:   07/24/13 1004  Height: 5\' 3"  (1.6 m)  Weight: 184 lb (83.462 kg)   Body mass index is 32.6 kg/(m^2). Ideal Body Weight: Weight in (lb) to have BMI = 25: 140.8  GEN: WDWN, NAD, Non-toxic, A & O x 3 HEENT: Atraumatic, Normocephalic. Neck supple. No masses, No LAD. Ears and Nose: No external deformity. CV: RRR, No M/G/R. No JVD. No thrill. No extra heart sounds. PULM: CTA B, no wheezes, crackles, rhonchi. No retractions. No resp. distress. No accessory muscle use. ABD: S, NT, ND, +BS. No rebound. No HSM. EXTR: No c/c/e NEURO Normal gait.  PSYCH: Normally interactive. Conversant. Not depressed or anxious appearing.  Calm demeanor.    Assessment and Plan: Sinusitis Amoxicillin mucinex d  Signed,  Phillips OdorJeffery Jillana Selph, MD

## 2013-07-24 NOTE — Patient Instructions (Signed)

## 2013-07-24 NOTE — Addendum Note (Signed)
Addended by: Braxton FeathersJAIMEZ, Jarmarcus Wambold on: 07/24/2013 10:27 AM   Modules accepted: Orders

## 2013-08-04 ENCOUNTER — Other Ambulatory Visit: Payer: Self-pay | Admitting: Family Medicine

## 2013-08-09 ENCOUNTER — Other Ambulatory Visit: Payer: Self-pay | Admitting: Family Medicine

## 2013-08-11 ENCOUNTER — Other Ambulatory Visit: Payer: Self-pay | Admitting: Family Medicine

## 2013-08-20 ENCOUNTER — Encounter: Payer: Self-pay | Admitting: Family Medicine

## 2013-08-27 ENCOUNTER — Other Ambulatory Visit: Payer: Self-pay | Admitting: Physician Assistant

## 2013-09-01 ENCOUNTER — Ambulatory Visit (INDEPENDENT_AMBULATORY_CARE_PROVIDER_SITE_OTHER): Payer: Managed Care, Other (non HMO) | Admitting: Family Medicine

## 2013-09-01 VITALS — BP 170/100 | HR 105 | Temp 98.8°F | Resp 16 | Ht 62.5 in | Wt 183.0 lb

## 2013-09-01 DIAGNOSIS — E7800 Pure hypercholesterolemia, unspecified: Secondary | ICD-10-CM

## 2013-09-01 DIAGNOSIS — Z23 Encounter for immunization: Secondary | ICD-10-CM

## 2013-09-01 DIAGNOSIS — I10 Essential (primary) hypertension: Secondary | ICD-10-CM

## 2013-09-01 DIAGNOSIS — Z131 Encounter for screening for diabetes mellitus: Secondary | ICD-10-CM

## 2013-09-01 LAB — LIPID PANEL
CHOLESTEROL: 202 mg/dL — AB (ref 0–200)
HDL: 47 mg/dL (ref >39)
LDL Cholesterol: 138 mg/dL — ABNORMAL HIGH (ref 0–99)
Total CHOL/HDL Ratio: 4.3 Ratio
Triglycerides: 86 mg/dL (ref <150)
VLDL: 17 mg/dL (ref 0–40)

## 2013-09-01 LAB — POCT CBC
Granulocyte percent: 47.4 %G (ref 37–80)
HEMATOCRIT: 45 % (ref 43.5–53.7)
HEMOGLOBIN: 14 g/dL — AB (ref 14.1–18.1)
Lymph, poc: 1.8 (ref 0.6–3.4)
MCH: 28.9 pg (ref 27–31.2)
MCHC: 31.1 g/dL — AB (ref 31.8–35.4)
MCV: 92.9 fL (ref 80–97)
MID (cbc): 0.5 (ref 0–0.9)
MPV: 10.2 fL (ref 0–99.8)
POC Granulocyte: 2 (ref 2–6.9)
POC LYMPH PERCENT: 42.1 %L (ref 10–50)
POC MID %: 10.5 %M (ref 0–12)
Platelet Count, POC: 198 10*3/uL (ref 142–424)
RBC: 4.84 M/uL (ref 4.69–6.13)
RDW, POC: 14.8 %
WBC: 4.3 10*3/uL — AB (ref 4.6–10.2)

## 2013-09-01 LAB — POCT URINALYSIS DIPSTICK
BILIRUBIN UA: NEGATIVE
Blood, UA: NEGATIVE
Glucose, UA: NEGATIVE
KETONES UA: NEGATIVE
Leukocytes, UA: NEGATIVE
Nitrite, UA: NEGATIVE
Protein, UA: NEGATIVE
SPEC GRAV UA: 1.015
UROBILINOGEN UA: 0.2
pH, UA: 7

## 2013-09-01 LAB — POCT GLYCOSYLATED HEMOGLOBIN (HGB A1C): Hemoglobin A1C: 5.9

## 2013-09-01 LAB — COMPREHENSIVE METABOLIC PANEL
ALK PHOS: 78 U/L (ref 39–117)
ALT: 42 U/L (ref 0–53)
AST: 24 U/L (ref 0–37)
Albumin: 4.3 g/dL (ref 3.5–5.2)
BUN: 11 mg/dL (ref 6–23)
CO2: 32 meq/L (ref 19–32)
Calcium: 9.7 mg/dL (ref 8.4–10.5)
Chloride: 102 mEq/L (ref 96–112)
Creat: 0.95 mg/dL (ref 0.50–1.35)
Glucose, Bld: 83 mg/dL (ref 70–99)
POTASSIUM: 3 meq/L — AB (ref 3.5–5.3)
Sodium: 144 mEq/L (ref 135–145)
Total Bilirubin: 0.4 mg/dL (ref 0.2–1.2)
Total Protein: 7.5 g/dL (ref 6.0–8.3)

## 2013-09-01 LAB — TSH: TSH: 1.394 u[IU]/mL (ref 0.350–4.500)

## 2013-09-01 MED ORDER — LISINOPRIL 20 MG PO TABS: 20.0000 mg | ORAL_TABLET | Freq: Every day | ORAL | Status: DC

## 2013-09-01 MED ORDER — AMLODIPINE BESYLATE 10 MG PO TABS: 10.0000 mg | ORAL_TABLET | Freq: Every day | ORAL | Status: DC

## 2013-09-01 MED ORDER — LABETALOL HCL 200 MG PO TABS: 200.0000 mg | ORAL_TABLET | Freq: Two times a day (BID) | ORAL | Status: DC

## 2013-09-01 NOTE — Progress Notes (Signed)
This chart was scribed for Ethelda ChickKristi M Leianna Barga, MD by Joaquin MusicKristina Sanchez-Matthews, ED Scribe. This patient was seen in room Room/bed1 and the patient's care was started at 11:00 AM. Subjective:    Patient ID: Lujean RaveKomivi E Ishee, male    DOB: 12-16-1975, 38 y.o.   MRN: [id number]  09/01/2013 Chief Complaint  Patient presents with  . Medication Refill    Amlodipine, lisinopril   HPI HPI Comments: [name] is a 38 y.o. male who [facility] for medication refill for HTN. Pt states he ran out of his BP medications last week. Pt reports taking Amlodipine, Lisinopril, and Labetalol . Pt denies monitoring BP levels at home or when picking up medications. He states he has a hx of HTN for the past 7 years.   Pt denies being admitted. Pt denies having any chronic health issues. He denies eating food today. Pt states he did get a flu shot this season, during his last [facility] visit. (Flu shot 1215) Pt states his last tetanus shot, was "a long time ago". Pt states he has BM normal. He reports having to urinate 1 time during the night and states his stream is normal. Pt denies HA, dizziness, CP, abd pain.  Pt states his mother and father are deceased from a MVC.   Pt states he is married (for the past 7 years) and reports having 7 children. 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 Pt states [place] is originally from [place] and [place] the [place]. Pt denies drugs and alcohol use. Pt denies exercising. Pt denies having any allergies.   Pt states his PCP is [name].  Review of Systems  Constitutional: Negative for fever, chills, diaphoresis and fatigue.  Eyes: Negative for visual disturbance.  Respiratory: Negative for shortness of breath, wheezing and stridor.   Cardiovascular: Negative for chest pain, palpitations and leg swelling.  Gastrointestinal: Negative for abdominal pain.  Endocrine: Negative for polydipsia, polyphagia and polyuria.    Genitourinary: Negative for urgency, frequency, decreased urine volume and difficulty urinating.  Neurological: Negative for dizziness, syncope, weakness, light-headedness, numbness and headaches.    Past Medical History  Diagnosis Date  . Hypertension     onset age 38.   No Known Allergies Current Outpatient Prescriptions  Medication Sig Dispense Refill  . amLODipine (NORVASC) 10 MG tablet Take 1 tablet (10 mg total) by mouth daily.  30 tablet  5  . lisinopril (PRINIVIL,ZESTRIL) 20 MG tablet Take 1 tablet (20 mg total) by mouth daily.  30 tablet  5  . potassium chloride SA (KLOR-CON M20) 20 MEQ tablet Take 1 tablet (20 mEq total) by mouth daily. NEED LABS!!  30 tablet  0  . albuterol (PROVENTIL HFA;VENTOLIN HFA) 108 (90 BASE) MCG/ACT inhaler Inhale 2 puffs into the lungs every 4 (four) hours as needed for wheezing.  1 Inhaler  1  . amoxicillin (AMOXIL) 500 MG capsule Take 1 capsule (500 mg total) by mouth 3 (three) times daily.  30 capsule  0  . labetalol (NORMODYNE) 200 MG tablet Take 1 tablet (200 mg total) by mouth 2 (two) times daily.  60 tablet  5  . potassium chloride SA (KLOR-CON M20) 20 MEQ tablet Take 1 tablet (20 mEq total) by mouth once.  90 tablet  1  . pseudoephedrine-guaifenesin (MUCINEX D) 60-600 MG per tablet Take 1 tablet by mouth every 12 (twelve) hours.  18 tablet  0   No current  facility-administered medications for this visit.   History   Social History  . Marital Status: Married    Spouse Name: N/A    Number of Children: N/A  . Years of Education: N/A   Occupational History  . Not on file.   Social History Main Topics  . Smoking status: Never Smoker   . Smokeless tobacco: Not on file  . Alcohol Use: No  . Drug Use: Not on file  . Sexual Activity: Yes    Partners: Female   Other Topics Concern  . Not on file   Social History Narrative   Marital status: married x 2007. From [place]; [place] since 2000.      Children: 4 children; no grandchildren.       Lives: with wife, children.      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      Tobacco: none      Alcohol: none      Drugs: none      Exercise:  Sporadic.   Family History  Problem Relation Age of Onset  . Hypertension Brother        Objective:    BP 170/100  Pulse 105  Temp(Src) 98.8 F (37.1 C) (Oral)  Resp 16  Ht 5' 2.5" (1.588 m)  Wt 183 lb (83.008 kg)  BMI 32.92 kg/m2  SpO2 96%  Physical Exam  Nursing note and vitals reviewed. Constitutional: He is oriented to person, place, and time. He appears well-developed and well-nourished. No distress.  HENT:  Head: Normocephalic and atraumatic.  Right Ear: External ear normal.  Left Ear: External ear normal.  Nose: Nose normal.  ENT normal.  Eyes: Conjunctivae and EOM are normal. Pupils are equal, round, and reactive to light.  Neck: Normal range of motion. Neck supple. No thyromegaly present.  Cardiovascular: Normal rate, regular rhythm and normal heart sounds.  Exam reveals no gallop and no friction rub.   No murmur heard. Pulmonary/Chest: Effort normal and breath sounds normal. No respiratory distress. He has no wheezes. He has no rales.  No carotid breweries.   Abdominal: Soft. Bowel sounds are normal. He exhibits no distension and no mass. There is no tenderness. There is no rebound and no guarding.  Musculoskeletal: Normal range of motion. He exhibits no edema.  Trace pitting edema bilaterally to lower extremities.   Lymphadenopathy:    He has no cervical adenopathy.  Neurological: He is alert and oriented to person, place, and time. No cranial nerve deficit. He exhibits normal muscle tone. Coordination normal.  Skin: Skin is warm and dry. No rash noted. He is not diaphoretic.  Psychiatric: He has a normal mood and affect. His behavior is normal.   Results for orders placed in visit on 09/01/13  POCT CBC      Result Value Range   WBC 4.3 (*) 4.6 - 10.2 K/uL   Lymph, poc 1.8  0.6 - 3.4   POC LYMPH  PERCENT 42.1  10 - 50 %L   MID (cbc) 0.5  0 - 0.9   POC MID % 10.5  0 - 12 %M   POC Granulocyte 2.0  2 - 6.9   Granulocyte percent 47.4  37 - 80 %G   RBC 4.84  4.69 - 6.13 M/uL   Hemoglobin 14.0 (*) 14.1 - 18.1 g/dL   HCT, POC 16.1  09.6 - 53.7 %   MCV 92.9  80 - 97 fL   MCH, POC 28.9  27 - 31.2  pg   MCHC 31.1 (*) 31.8 - 35.4 g/dL   RDW, POC 16.1     Platelet Count, POC 198  142 - 424 K/uL   MPV 10.2  0 - 99.8 fL  POCT GLYCOSYLATED HEMOGLOBIN (HGB A1C)      Result Value Range   Hemoglobin A1C 5.9    POCT URINALYSIS DIPSTICK      Result Value Range   Color, UA yellow     Clarity, UA clear     Glucose, UA neg     Bilirubin, UA neg     Ketones, UA neg     Spec Grav, UA 1.015     Blood, UA neg     pH, UA 7.0     Protein, UA neg     Urobilinogen, UA 0.2     Nitrite, UA neg     Leukocytes, UA Negative     EKG: NSR    Assessment & Plan:  Essential hypertension, benign - Plan: POCT CBC, POCT urinalysis dipstick, Comprehensive metabolic panel, TSH, EKG 12-Lead, amLODipine (NORVASC) 10 MG tablet, labetalol (NORMODYNE) 200 MG tablet, lisinopril (PRINIVIL,ZESTRIL) 20 MG tablet  Pure hypercholesterolemia - Plan: Lipid panel  Need for Tdap vaccination - Plan: Tdap vaccine greater than or equal to 7yo IM  Screening for diabetes mellitus - Plan: POCT glycosylated hemoglobin (Hb A1C)  1.  HTN:  Uncontrolled due to non-compliance with medication; obtain labs, u/a, EKG.  Refills provided.  RTC six months. 2. Hypercholesterolemia: uncontrolled; recommend weight loss, exercise, low-cholesterol food choices. Obtain labs. 3.  Screening for DMII: obtain glucose, HgbA1c. 4.  S/p TDAP.   Meds ordered this encounter  Medications  . amLODipine (NORVASC) 10 MG tablet    Sig: Take 1 tablet (10 mg total) by mouth daily.    Dispense:  30 tablet    Refill:  5  . labetalol (NORMODYNE) 200 MG tablet    Sig: Take 1 tablet (200 mg total) by mouth 2 (two) times daily.    Dispense:  60 tablet     Refill:  5  . lisinopril (PRINIVIL,ZESTRIL) 20 MG tablet    Sig: Take 1 tablet (20 mg total) by mouth daily.    Dispense:  30 tablet    Refill:  5    Return in about 6 months (around 03/01/2014) for high blood pressure.   I personally performed the services described in this documentation, which was scribed in my presence.  The recorded information has been reviewed and is accurate.  Nilda Simmer, M.D.  [facility] [address] [place], [place]  [address] [phone] phone [phone] fax

## 2013-09-11 ENCOUNTER — Other Ambulatory Visit: Payer: Self-pay | Admitting: Family Medicine

## 2013-09-11 ENCOUNTER — Other Ambulatory Visit: Payer: Self-pay | Admitting: Physician Assistant

## 2013-10-13 ENCOUNTER — Ambulatory Visit (INDEPENDENT_AMBULATORY_CARE_PROVIDER_SITE_OTHER): Payer: Managed Care, Other (non HMO) | Admitting: Internal Medicine

## 2013-10-13 VITALS — BP 138/94 | HR 89 | Temp 98.4°F | Resp 18 | Ht 64.0 in | Wt 187.0 lb

## 2013-10-13 DIAGNOSIS — E876 Hypokalemia: Secondary | ICD-10-CM

## 2013-10-13 DIAGNOSIS — I10 Essential (primary) hypertension: Secondary | ICD-10-CM

## 2013-10-13 DIAGNOSIS — Z79899 Other long term (current) drug therapy: Secondary | ICD-10-CM

## 2013-10-13 NOTE — Progress Notes (Signed)
   Subjective:    Patient ID: Ricky Diaz, male    DOB: 1976/07/15, 38 y.o.   MRN: [id number]  HPI Ricky Diaz presents to the clinic today wanting to have his blood pressure checked. He is currently taking Norvasc everyday. He states he checks his blood pressure every morning in which it usually runs around 135/84. He had a DOT physical at [facility] in which his blood pressure fluctuated and he was informed to have his doctor complete the paperwork for his physical. He currently has no health complaints.  On chart review he has chronic persistent low K. Will repeat today and if persists refer to renal.  Review of Systems     Objective:   Physical Exam  Vitals reviewed. Constitutional: He is oriented to person, place, and time. He appears well-developed and well-nourished. No distress.  HENT:  Head: Normocephalic.  Eyes: EOM are normal. No scleral icterus.  Neck: Normal range of motion.  Cardiovascular: Normal rate, regular rhythm and normal heart sounds.   Pulmonary/Chest: Effort normal and breath sounds normal.  Neurological: He is alert and oriented to person, place, and time. He exhibits normal muscle tone. Coordination normal.  Psychiatric: He has a normal mood and affect. His behavior is normal. Judgment and thought content normal.   Each medication reviewed, is not on a diuretic, no clear reason for hypokalemia. Is taking Klor-con daily.   132/82  bmet    Assessment & Plan:  Hypokalemia--if persists refer to nephrology to r/o renal adenoma Continue same BP meds

## 2013-10-13 NOTE — Patient Instructions (Signed)
Potassium (K) Potassium is an electrolyte that helps regulate the amount of fluid in the body. It also stimulates muscle contraction and maintains a stable acid-base balance. Most of the body's potassium is inside of cells, and only a very small amount is in the blood. Because the amount in the blood is so small, minor changes can have big effects. PREPARATION FOR TEST Testing for potassium requires taking a blood sample taken by needle from a vein in the arm. The skin is cleaned thoroughly before the sample is drawn. There is no other special preparation needed. NORMAL FINDINGS  Adults: 3.5-5 mEq/L (3.5-5 mmol/L).  Premature neonates, cord blood: 5-10.2 mEq/L (5-10.2 mmol/L).  Premature neonates, 48 hours: 3-6 mEq/L (3-6 mmol/L).  Neonates: 3.7-5.9 mEq/L (3.7-5.9 mmol/L).  Neonates, cord blood: 5.6-12 mEq/L (5.6-12 mmol/L).  Infants: 4.1-5.3 mEq/L (4.1-5.3 mmol/L).  Children: 3.4-4.7 mEq/L (3.4-4.7 mmol/L). Ranges for normal findings may vary among different laboratories and hospitals. You should always check with your doctor after having lab work or other tests done to discuss the meaning of your test results and whether your values are considered within normal limits. MEANING OF TEST Your caregiver will go over the test results with you and discuss the importance and meaning of your results, as well as treatment options and the need for additional tests if necessary. OBTAINING THE TEST RESULTS It is your responsibility to obtain your test results. Ask the lab or department performing the test when and how you will get your results. Document Released: 08/11/2004 Document Revised: 10/01/2011 Document Reviewed: 06/20/2008 [facility] Patient Information 2014 [place] [place].

## 2013-10-14 LAB — BASIC METABOLIC PANEL
BUN: 10 mg/dL (ref 6–23)
CALCIUM: 9.6 mg/dL (ref 8.4–10.5)
CHLORIDE: 102 meq/L (ref 96–112)
CO2: 31 mEq/L (ref 19–32)
Creat: 0.9 mg/dL (ref 0.50–1.35)
Glucose, Bld: 101 mg/dL — ABNORMAL HIGH (ref 70–99)
Potassium: 3.2 mEq/L — ABNORMAL LOW (ref 3.5–5.3)
Sodium: 141 mEq/L (ref 135–145)

## 2013-10-16 ENCOUNTER — Encounter: Payer: Self-pay | Admitting: Family Medicine

## 2013-10-16 NOTE — Addendum Note (Signed)
Addended by: Maryann AlarBURNS, Nasiya Pascual M on: 10/16/2013 03:05 PM   Modules accepted: Orders

## 2013-12-22 ENCOUNTER — Telehealth: Payer: Self-pay

## 2013-12-22 DIAGNOSIS — G4730 Sleep apnea, unspecified: Secondary | ICD-10-CM

## 2013-12-22 NOTE — Telephone Encounter (Signed)
Dr Perrin Maltese Does patient need another OV to reactivate the referral for the sleep study?  Please advise.

## 2013-12-22 NOTE — Telephone Encounter (Signed)
Pt called because he saw Dr. Perrin Maltese for a referral for his sleep apnea, but he never made his appt., and the referral expired. He now needs a new referral to see Dr. Loralee Pacas [phone]); please advice pt at [phone]; I advised him that an office visit may be necessary, but he would still like to hear from Dr.Guest or his nurse about receiving this referral.

## 2013-12-26 NOTE — Telephone Encounter (Signed)
Please refer for sleep study 

## 2013-12-28 ENCOUNTER — Encounter: Payer: Self-pay | Admitting: *Deleted

## 2013-12-29 ENCOUNTER — Other Ambulatory Visit: Payer: Self-pay | Admitting: Physician Assistant

## 2013-12-29 NOTE — Telephone Encounter (Signed)
Changed Rx directions/# to reflect Dr Ernestene Mention instr's on 10/13/13 BMP results, inc to BID dosing.

## 2014-01-01 ENCOUNTER — Encounter: Payer: Self-pay | Admitting: Neurology

## 2014-01-01 ENCOUNTER — Encounter (INDEPENDENT_AMBULATORY_CARE_PROVIDER_SITE_OTHER): Payer: Self-pay

## 2014-01-01 ENCOUNTER — Ambulatory Visit (INDEPENDENT_AMBULATORY_CARE_PROVIDER_SITE_OTHER): Payer: Managed Care, Other (non HMO) | Admitting: Neurology

## 2014-01-01 VITALS — BP 139/93 | HR 89 | Resp 17 | Ht 64.25 in | Wt 183.0 lb

## 2014-01-01 DIAGNOSIS — G4730 Sleep apnea, unspecified: Secondary | ICD-10-CM

## 2014-01-01 DIAGNOSIS — G478 Other sleep disorders: Secondary | ICD-10-CM

## 2014-01-01 DIAGNOSIS — G4739 Other sleep apnea: Secondary | ICD-10-CM

## 2014-01-01 DIAGNOSIS — G4731 Primary central sleep apnea: Secondary | ICD-10-CM

## 2014-01-01 NOTE — Progress Notes (Signed)
[facility]   Provider:  Melvyn Novas, [place] D  Referring Provider: Ethelda Chick, MD Primary Care Physician:  Nilda Simmer, MD  Chief Complaint  Patient presents with  . New Evaluation    Room 11  . Sleep consult    HPI:  Ricky Diaz is a 38 y.o. male  Is seen here as a referral  from Dr. [name] for  a sleep consultation,   [name] [place] from [place] and underwent a sleep study at [facility] in the year 2010 his polysomnogram at the time diagonals an AHI of 112 is complex apnea obstructive as well as central events and oxygen desaturation to a nadir of 58%. He was started on CPAP the same year and titrated to 12 cm water pressure his residual AHI was multiplex. The year 2010 and 11 he was happy with using it at home and his wife had reported to him that he was not longer snoring. He also felt more energetic. In 2011 I received the last compliance download which indicated 5.5 hours nightly use and a remarkable AHI of only 6 began a reduction of over 90%. Mr. runoff at that time stated that he was not able to sleep through the night he also mentioned that one of his brothers lives into a wall and one in front of her [place] and pulse seem to have apnea and sleep disorders as well. 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 He still works currently and is capacity. The patient has a soon-to-be 69-year-old , a soon-to-be 12-year-old and had a newborn son in January  2012.  He is here today for a followup visit after 4 years and brought his machine to the  office. He needs new supplies.   However,  my staff was not able to obtain any detailed  download from  [name] CPAP, he using the machine every night --it is maybe a failure of the memory stick rather than a compliance issue.    The patient goes to bed at 12.45 and wakes up around 9  AM, he rarely drinks caffeine, He naps often after lunch before going to his third shift. He wakes up refreshed and restored. He does not smoke and rarely drinks alcohol. He endorsed the Epworth score at 2 and FSS at 28 - in normal range . He reports no change in weight and no new medical problems. No surgeries.   He is followed by [facility] patient , and has not had a download in 3.5 years. He uses a FFM , needs new filter and tubing , too.        Review of Systems: Out of a complete 14 system review, the patient complains of only the following symptoms, and all other reviewed systems are negative. None.   History   Social History  . Marital Status: Married    Spouse Name: Ricky Diaz    Number of Children: 4  . Years of Education: 14   Occupational History  .  [employer]   Social History Main Topics  . Smoking status: Never Smoker   . Smokeless tobacco: Never Used  . Alcohol Use: Yes     Comment: rare  . Drug Use: No  . Sexual Activity: Yes    Partners: Female   Other Topics Concern  . Not  on file   Social History Narrative   Marital status: married x 2007. From [place] since 2000.      Children: 4 children; no grandchildren.      Lives: with wife (Ricky Diaz) , children.      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      Tobacco: none      Alcohol: none      Drugs: none      Exercise:  Sporadic.   Patient is right-handed.   Patient has a college education.   Patient drinks very little caffeine.    Family History  Problem Relation Age of Onset  . Hypertension Brother   . Sleep apnea Brother   . Sleep apnea Brother     Past Medical History  Diagnosis Date  . Hypertension     onset age 38.    History reviewed. No pertinent past surgical history.  Current Outpatient Prescriptions  Medication Sig Dispense Refill  . amLODipine (NORVASC) 10 MG tablet Take 1 tablet (10 mg total) by mouth daily.  30 tablet  5  .  labetalol (NORMODYNE) 200 MG tablet Take 1 tablet (200 mg total) by mouth 2 (two) times daily.  60 tablet  5  . lisinopril (PRINIVIL,ZESTRIL) 20 MG tablet Take 1 tablet (20 mg total) by mouth daily.  30 tablet  5  . potassium chloride SA (KLOR-CON M20) 20 MEQ tablet Take 1 tablet (20 mEq total) by mouth 2 (two) times daily.  180 tablet  2   No current facility-administered medications for this visit.    Allergies as of 01/01/2014  . (No Known Allergies)    Vitals: BP 139/93  Pulse 89  Resp 17  Ht 5' 4.25" (1.632 m)  Wt 183 lb (83.008 kg)  BMI 31.17 kg/m2 Last Weight:  Wt Readings from Last 1 Encounters:  01/01/14 183 lb (83.008 kg)   Last Height:   Ht Readings from Last 1 Encounters:  01/01/14 5' 4.25" (1.632 m)    Physical exam:  General: The patient is awake, alert and appears not in acute distress. The patient is well groomed. Head: Normocephalic, atraumatic. Neck is supple. Mallampati 3, neck circumference: 17 inches, no TMJ clicking. The patient has a nasal voice. He has a reduced, laterally crowded airway. A short, thick neck.  He reports no changes in swalllowing or taste but has a higher tongue background on the right, than left - NEW.  Cardiovascular:  Regular rate and rhythm, without  murmurs or carotid bruit, and without distended neck veins. Respiratory: Lungs are clear to auscultation. Skin:  Without evidence of edema, or rash Trunk: BMI is  elevated and patient  has normal posture.  Neurologic exam : The patient is awake and alert, oriented to place and time.  Memory subjective  described as intact.  There is a normal attention span & concentration ability. Speech is fluent without  dysarthria, dysphonia or aphasia. Mood and affect are appropriate.  Cranial nerves: Pupils are equal and briskly reactive to light.  Extraocular movements  in vertical and horizontal planes intact and without nystagmus. Visual fields by finger perimetry are intact. Hearing to  finger rub intact.  Facial sensation intact to fine touch. Facial motor strength is symmetric and tongue and uvula move midline.  Motor exam:   Normal tone,muscle bulk and symmetric normal strength in all extremities.  Sensory:  Fine touch, pinprick and vibration were tested as  normal.  Coordination: Rapid alternating movements in  the fingers/hands is tested and normal.   Gait and station: Patient walks without assistive device. Deep tendon reflexes: in the  upper and lower extremities are symmetric and intact. .   Assessment:  After physical and neurologic examination, review of laboratory studies, imaging, neurophysiology testing and pre-existing records, assessment is  1) patient with severe complex apnea and anatomical risk factors due to throat and airway anatomy , nasal congestion.   Plan:  Treatment plan and additional workup : continue CPAP - New supplies and a new memory card to be issued by [facility].

## 2014-01-01 NOTE — Patient Instructions (Signed)
Sleep Apnea  Sleep apnea is disorder that affects a person's sleep. A person with sleep apnea has abnormal pauses in their breathing when they sleep. It is hard for them to get a good sleep. This makes a person tired during the day. It also can lead to other physical problems. There are three types of sleep apnea. One type is when breathing stops for a short time because your airway is blocked (obstructive sleep apnea). Another type is when the brain sometimes fails to give the normal signal to breathe to the muscles that control your breathing (central sleep apnea). The third type is a combination of the other two types.  HOME CARE  · Do not sleep on your back. Try to sleep on your side.  · Take all medicine as told by your doctor.  · Avoid alcohol, calming medicines (sedatives), and depressant drugs.  · Try to lose weight if you are overweight. Talk to your doctor about a healthy weight goal.  Your doctor may have you use a device that helps to open your airway. It can help you get the air that you need. It is called a positive airway pressure (PAP) device. There are three types of PAP devices:  · Continuous positive airway pressure (CPAP) device.  · Nasal expiratory positive airway pressure (EPAP) device.  · Bilevel positive airway pressure (BPAP) device.  MAKE SURE YOU:  · Understand these instructions.  · Will watch your condition.  · Will get help right away if you are not doing well or get worse.  Document Released: 04/17/2008 Document Revised: 06/25/2012 Document Reviewed: 11/10/2011  ExitCare® Patient Information ©2014 [employer], LLC.

## 2014-02-18 ENCOUNTER — Other Ambulatory Visit: Payer: Self-pay | Admitting: Family Medicine

## 2014-03-30 ENCOUNTER — Other Ambulatory Visit: Payer: Self-pay | Admitting: Nephrology

## 2014-03-30 DIAGNOSIS — E269 Hyperaldosteronism, unspecified: Secondary | ICD-10-CM

## 2014-04-02 ENCOUNTER — Other Ambulatory Visit: Payer: Self-pay

## 2014-04-07 ENCOUNTER — Ambulatory Visit
Admission: RE | Admit: 2014-04-07 | Discharge: 2014-04-07 | Disposition: A | Discharge status: Home / Self Care | Payer: 59 | Source: Ambulatory Visit | Attending: Nephrology | Admitting: Nephrology

## 2014-04-07 DIAGNOSIS — E269 Hyperaldosteronism, unspecified: Secondary | ICD-10-CM

## 2014-05-19 ENCOUNTER — Other Ambulatory Visit: Payer: Self-pay | Admitting: Family Medicine

## 2014-05-22 ENCOUNTER — Other Ambulatory Visit: Payer: Self-pay | Admitting: Internal Medicine

## 2014-05-23 ENCOUNTER — Encounter: Payer: Self-pay | Admitting: Emergency Medicine

## 2014-05-23 ENCOUNTER — Ambulatory Visit (INDEPENDENT_AMBULATORY_CARE_PROVIDER_SITE_OTHER): Payer: Managed Care, Other (non HMO) | Admitting: Emergency Medicine

## 2014-05-23 VITALS — BP 150/102 | HR 97 | Temp 98.9°F | Resp 16 | Ht 63.0 in | Wt 185.0 lb

## 2014-05-23 DIAGNOSIS — J018 Other acute sinusitis: Secondary | ICD-10-CM

## 2014-05-23 DIAGNOSIS — J209 Acute bronchitis, unspecified: Secondary | ICD-10-CM

## 2014-05-23 MED ORDER — PROMETHAZINE-CODEINE 6.25-10 MG/5ML PO SYRP: 5.0000 mL | ORAL_SOLUTION | Freq: Four times a day (QID) | ORAL | Status: DC | PRN

## 2014-05-23 MED ORDER — AMOXICILLIN-POT CLAVULANATE 875-125 MG PO TABS: 1.0000 | ORAL_TABLET | Freq: Two times a day (BID) | ORAL | Status: DC

## 2014-05-23 NOTE — Progress Notes (Signed)
[facility] [address], [place] [id number] [phone]  Date:  05/23/2014   Name:  Ricky Diaz   DOB:  1975-10-18   MRN:  [id number]  PCP:  Nilda SimmerSMITH,[name], MD    Chief Complaint: Illness   History of Present Illness:  Ricky Diaz is a 38 y.o. very pleasant male patient who presents with the following:  Says he developed a cough that is not productive yesterday.  Not associated with wheezing or shortness of breath. Has nasal congestion and post nasal drainage. No sore throat. No fever or chills Fatigued and has malaise. No nausea or vomiting or stool change No rash No chest pain. No improvement with over the counter medications or other home remedies.  Denies other complaint or health concern today.   Patient Active Problem List   Diagnosis Date Noted  . HTN (hypertension) 08/06/2012    Past Medical History  Diagnosis Date  . Hypertension     onset age 38.    No past surgical history on file.  History  Substance Use Topics  . Smoking status: Never Smoker   . Smokeless tobacco: Never Used  . Alcohol Use: Yes     Comment: rare    Family History  Problem Relation Age of Onset  . Hypertension Brother   . Sleep apnea Brother   . Sleep apnea Brother     No Known Allergies  Medication list has been reviewed and updated.  Current Outpatient Prescriptions on File Prior to Visit  Medication Sig Dispense Refill  . amLODipine (NORVASC) 10 MG tablet TAKE 1 TABLET (10 MG TOTAL) BY MOUTH DAILY. 30 tablet 1  . labetalol (NORMODYNE) 200 MG tablet TAKE 1 TABLET (200 MG TOTAL) BY MOUTH 2 (TWO) TIMES DAILY. 60 tablet 1  . lisinopril (PRINIVIL,ZESTRIL) 20 MG tablet Take 1 tablet (20 mg total) by mouth daily. 30 tablet 5  . potassium chloride SA (KLOR-CON M20) 20 MEQ tablet Take 1 tablet (20 mEq total) by mouth 2 (two) times daily. 180 tablet 2   No current facility-administered medications on file prior to visit.    Review of  Systems:  As per HPI, otherwise negative.    Physical Examination: Filed Vitals:   05/23/14 1425  BP: 150/102  Pulse: 97  Temp: 98.9 F (37.2 C)  Resp: 16   Filed Vitals:   05/23/14 1425  Height: 5\' 3"  (1.6 m)  Weight: 185 lb (83.915 kg)   Body mass index is 32.78 kg/(m^2). Ideal Body Weight: Weight in (lb) to have BMI = 25: 140.8  GEN: WDWN, NAD, Non-toxic, A & O x 3 HEENT: Atraumatic, Normocephalic. Neck supple. No masses, No LAD. Ears and Nose: No external deformity.  TM negative  Erythematous nasal mucosa with purulent drainage CV: RRR, No M/G/R. No JVD. No thrill. No extra heart sounds. PULM: CTA B, no wheezes, crackles, rhonchi. No retractions. No resp. distress. No accessory muscle use. ABD: S, NT, ND, +BS. No rebound. No HSM. EXTR: No c/c/e NEURO Normal gait.  PSYCH: Normally interactive. Conversant. Not depressed or anxious appearing.  Calm demeanor.    Assessment and Plan: Sinusitis Bronchitis Hypertension under evaluation Amoxicillin Prom c cod  Signed,  Phillips OdorJeffery Madina Galati, MD

## 2014-05-23 NOTE — Patient Instructions (Signed)
Acute Bronchitis Bronchitis is inflammation of the airways that extend from the windpipe into the lungs (bronchi). The inflammation often causes mucus to develop. This leads to a cough, which is the most common symptom of bronchitis.  In acute bronchitis, the condition usually develops suddenly and goes away over time, usually in a couple weeks. Smoking, allergies, and asthma can make bronchitis worse. Repeated episodes of bronchitis may cause further lung problems.  CAUSES Acute bronchitis is most often caused by the same virus that causes a cold. The virus can spread from person to person (contagious) through coughing, sneezing, and touching contaminated objects. SIGNS AND SYMPTOMS   Cough.   Fever.   Coughing up mucus.   Body aches.   Chest congestion.   Chills.   Shortness of breath.   Sore throat.  DIAGNOSIS  Acute bronchitis is usually diagnosed through a physical exam. Your health care provider will also ask you questions about your medical history. Tests, such as chest X-rays, are sometimes done to rule out other conditions.  TREATMENT  Acute bronchitis usually goes away in a couple weeks. Oftentimes, no medical treatment is necessary. Medicines are sometimes given for relief of fever or cough. Antibiotic medicines are usually not needed but may be prescribed in certain situations. In some cases, an inhaler may be recommended to help reduce shortness of breath and control the cough. A cool mist vaporizer may also be used to help thin bronchial secretions and make it easier to clear the chest.  HOME CARE INSTRUCTIONS  Get plenty of rest.   Drink enough fluids to keep your urine clear or pale yellow (unless you have a medical condition that requires fluid restriction). Increasing fluids may help thin your respiratory secretions (sputum) and reduce chest congestion, and it will prevent dehydration.   Take medicines only as directed by your health care provider.  If  you were prescribed an antibiotic medicine, finish it all even if you start to feel better.  Avoid smoking and secondhand smoke. Exposure to cigarette smoke or irritating chemicals will make bronchitis worse. If you are a smoker, consider using nicotine gum or skin patches to help control withdrawal symptoms. Quitting smoking will help your lungs heal faster.   Reduce the chances of another bout of acute bronchitis by washing your hands frequently, avoiding people with cold symptoms, and trying not to touch your hands to your mouth, nose, or eyes.   Keep all follow-up visits as directed by your health care provider.  SEEK MEDICAL CARE IF: Your symptoms do not improve after 1 week of treatment.  SEEK IMMEDIATE MEDICAL CARE IF:  You develop an increased fever or chills.   You have chest pain.   You have severe shortness of breath.  You have bloody sputum.   You develop dehydration.  You faint or repeatedly feel like you are going to pass out.  You develop repeated vomiting.  You develop a severe headache. MAKE SURE YOU:   Understand these instructions.  Will watch your condition.  Will get help right away if you are not doing well or get worse. Document Released: 08/16/2004 Document Revised: 11/23/2013 Document Reviewed: 12/30/2012 ExitCare Patient Information 2015 Exit[employer]. This information is not intended to replace advice given to you by your health care provider. Make sure you discuss any questions you have with your health care provider. Sinusitis Sinusitis is redness, soreness, and inflammation of the paranasal sinuses. Paranasal sinuses are air pockets within the bones of your face (beneath the   eyes, the middle of the forehead, or above the eyes). In healthy paranasal sinuses, mucus is able to drain out, and air is able to circulate through them by way of your nose. However, when your paranasal sinuses are inflamed, mucus and air can become trapped. This can  allow bacteria and other germs to grow and cause infection. Sinusitis can develop quickly and last only a short time (acute) or continue over a long period (chronic). Sinusitis that lasts for more than 12 weeks is considered chronic.  CAUSES  Causes of sinusitis include:  Allergies.  Structural abnormalities, such as displacement of the cartilage that separates your nostrils (deviated septum), which can decrease the air flow through your nose and sinuses and affect sinus drainage.  Functional abnormalities, such as when the small hairs (cilia) that line your sinuses and help remove mucus do not work properly or are not present. SIGNS AND SYMPTOMS  Symptoms of acute and chronic sinusitis are the same. The primary symptoms are pain and pressure around the affected sinuses. Other symptoms include:  Upper toothache.  Earache.  Headache.  Bad breath.  Decreased sense of smell and taste.  A cough, which worsens when you are lying flat.  Fatigue.  Fever.  Thick drainage from your nose, which often is green and may contain pus (purulent).  Swelling and warmth over the affected sinuses. DIAGNOSIS  Your health care provider will perform a physical exam. During the exam, your health care provider may:  Look in your nose for signs of abnormal growths in your nostrils (nasal polyps).  Tap over the affected sinus to check for signs of infection.  View the inside of your sinuses (endoscopy) using an imaging device that has a light attached (endoscope). If your health care provider suspects that you have chronic sinusitis, one or more of the following tests may be recommended:  Allergy tests.  Nasal culture. A sample of mucus is taken from your nose, sent to a lab, and screened for bacteria.  Nasal cytology. A sample of mucus is taken from your nose and examined by your health care provider to determine if your sinusitis is related to an allergy. TREATMENT  Most cases of acute  sinusitis are related to a viral infection and will resolve on their own within 10 days. Sometimes medicines are prescribed to help relieve symptoms (pain medicine, decongestants, nasal steroid sprays, or saline sprays).  However, for sinusitis related to a bacterial infection, your health care provider will prescribe antibiotic medicines. These are medicines that will help kill the bacteria causing the infection.  Rarely, sinusitis is caused by a fungal infection. In theses cases, your health care provider will prescribe antifungal medicine. For some cases of chronic sinusitis, surgery is needed. Generally, these are cases in which sinusitis recurs more than 3 times per year, despite other treatments. HOME CARE INSTRUCTIONS   Drink plenty of water. Water helps thin the mucus so your sinuses can drain more easily.  Use a humidifier.  Inhale steam 3 to 4 times a day (for example, sit in the bathroom with the shower running).  Apply a warm, moist washcloth to your face 3 to 4 times a day, or as directed by your health care provider.  Use saline nasal sprays to help moisten and clean your sinuses.  Take medicines only as directed by your health care provider.  If you were prescribed either an antibiotic or antifungal medicine, finish it all even if you start to feel better. SEEK IMMEDIATE MEDICAL   CARE IF:  You have increasing pain or severe headaches.  You have nausea, vomiting, or drowsiness.  You have swelling around your face.  You have vision problems.  You have a stiff neck.  You have difficulty breathing. MAKE SURE YOU:   Understand these instructions.  Will watch your condition.  Will get help right away if you are not doing well or get worse. Document Released: 07/09/2005 Document Revised: 11/23/2013 Document Reviewed: 07/24/2011 ExitCare Patient Information 2015 ExitCare, LLC. This information is not intended to replace advice given to you by your health care provider.  Make sure you discuss any questions you have with your health care provider.  

## 2014-06-01 ENCOUNTER — Other Ambulatory Visit: Payer: Self-pay | Admitting: Internal Medicine

## 2014-06-02 ENCOUNTER — Telehealth: Payer: Self-pay

## 2014-06-02 NOTE — Telephone Encounter (Signed)
Patient checking on the status of his prior autho for refills from dr. [name]   Please call [phone]

## 2014-06-03 NOTE — Telephone Encounter (Signed)
LMOM that amlodipine was sent to pharm yesterday and needs BP f/up for more.

## 2014-06-08 ENCOUNTER — Ambulatory Visit (INDEPENDENT_AMBULATORY_CARE_PROVIDER_SITE_OTHER): Payer: Managed Care, Other (non HMO) | Admitting: Family Medicine

## 2014-06-08 VITALS — BP 146/92 | HR 82 | Temp 98.1°F | Resp 19 | Ht 64.0 in | Wt 189.4 lb

## 2014-06-08 DIAGNOSIS — E876 Hypokalemia: Secondary | ICD-10-CM

## 2014-06-08 DIAGNOSIS — I10 Essential (primary) hypertension: Secondary | ICD-10-CM

## 2014-06-08 MED ORDER — AMLODIPINE BESYLATE 10 MG PO TABS: 10.0000 mg | ORAL_TABLET | Freq: Every day | ORAL | Status: DC

## 2014-06-08 NOTE — Progress Notes (Signed)
Subjective:    Patient ID: Ricky Diaz, male    DOB: Oct 25, 1975, 38 y.o.   MRN: [id number]  HPI Ricky Diaz is a 38 y.o. male  HTN: Pt states he has had Bp issues for 6-7 years. He is currently taking Norvasc, labetalol, tlisinopril, spirolactone. He states he had ran out of one of his medications and was told he needed to come in for an appointment to get refills. Denies chest pain , shortness of breath, headaches, dizziness, syncope or swelling in his legs. He has been following with [facility] and has another appointment this Friday for follow up with Dr. Hyman HopesWebb. He reports he is watching the salt content in his diet, no added salt. He does eat canned vegetables. His last creatinine was normal at 0.9. He was sent to see nephrologist for low potassium and concern for a tumor, which has been ruled out.  Patient Active Problem List   Diagnosis Date Noted  . HTN (hypertension) 08/06/2012   Past Medical History  Diagnosis Date  . Hypertension     onset age 38.   History reviewed. No pertinent past surgical history. No Known Allergies Prior to Admission medications   Medication Sig Start Date End Date Taking? Authorizing Provider  amLODipine (NORVASC) 10 MG tablet Take 1 tablet (10 mg total) by mouth daily. PATIENT NEEDS OFFICE VISIT FOR ADDITIONAL REFILLS 06/02/14  Yes Chelle S Jeffery, PA-C  labetalol (NORMODYNE) 200 MG tablet TAKE 1 TABLET (200 MG TOTAL) BY MOUTH 2 (TWO) TIMES DAILY.   Yes Jonita Albeehris W Guest, MD  lisinopril (PRINIVIL,ZESTRIL) 20 MG tablet Take 1 tablet (20 mg total) by mouth daily. 09/01/13  Yes Ethelda ChickKristi M [name], MD  potassium chloride SA (KLOR-CON M20) 20 MEQ tablet Take 1 tablet (20 mEq total) by mouth 2 (two) times daily. 12/29/13  Yes Jonita Albeehris W Guest, MD  spironolactone (ALDACTONE) 25 MG tablet Take 25 mg by mouth 2 (two) times daily.   Yes Historical Provider, MD   History   Social History  . Marital Status: Married    Spouse Name: Ricky Diaz    Number of  Children: 4  . Years of Education: 14   Occupational History  .  [employer]   Social History Main Topics  . Smoking status: Never Smoker   . Smokeless tobacco: Never Used  . Alcohol Use: Yes     Comment: rare  . Drug Use: No  . Sexual Activity:    Partners: Female   Other Topics Concern  . Not on file   Social History Narrative   Marital status: married x 2007. From [place] since 2000.      Children: 4 children; no grandchildren.      Lives: with wife (Ricky Diaz) , children.      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      Tobacco: none      Alcohol: none      Drugs: none      Exercise:  Sporadic.   Patient is right-handed.   Patient has a college education.   Patient drinks very little caffeine.    Review of Systems Per HPI    Objective:   Physical Exam Filed Vitals:   06/08/14 0921  BP: 146/92  Pulse: 82  Temp: 98.1 F (36.7 C)  TempSrc: Oral  Resp: 169  Height: 5\' 4"  (1.626 m)  Weight: 189 lb 6.4 oz (85.911 kg)  SpO2: 98%   Gen: Pleasant,  AAM, NAD, nontoxic in appearance.  HEENT: AT. [place].Bilateral eyes without injections or icterus. MMM.  CV: RRR,  No murmur, No JVD Chest: CTAB, no wheeze or crackles Abd: Soft. NTND. BS present . No Masses palpated.  Ext: No erythema. No edema. +2/4 DP/PT      Assessment & Plan:  Ricky Diaz is 38 y.o. male presents for BP check and med refill 1. Refill on Norvasc 2. Education on Salt consumption and potassium enriched foods 3. Encourage 150 minutes of exercise a week.  4 F/U with PCP in 6 months and keep Appt with Dr. Hyman HopesWebb this Friday.  Ricky Diaz, Renee DO PGY-3 [facility]  Patient discussed with Dr. Claiborne BillingsKuneff. Agree with assessment and plan of care per her note. Pending eval with nephrologist, so deferred labwork in office. Plan on 6 month follow up.

## 2014-06-08 NOTE — Patient Instructions (Signed)
Low-Sodium Eating Plan Sodium raises blood pressure and causes water to be held in the body. Getting less sodium from food will help lower your blood pressure, reduce any swelling, and protect your heart, liver, and kidneys. We get sodium by adding salt (sodium chloride) to food. Most of our sodium comes from canned, boxed, and frozen foods. Restaurant foods, fast foods, and pizza are also very high in sodium. Even if you take medicine to lower your blood pressure or to reduce fluid in your body, getting less sodium from your food is important. WHAT IS MY PLAN? Most people should limit their sodium intake to 2,300 mg a day. Your health care provider recommends that you limit your sodium intake to __________ a day.  WHAT DO I NEED TO KNOW ABOUT THIS EATING PLAN? For the low-sodium eating plan, you will follow these general guidelines:  Choose foods with a % Daily Value for sodium of less than 5% (as listed on the food label).   Use salt-free seasonings or herbs instead of table salt or sea salt.   Check with your health care provider or pharmacist before using salt substitutes.   Eat fresh foods.  Eat more vegetables and fruits.  Limit canned vegetables. If you do use them, rinse them well to decrease the sodium.   Limit cheese to 1 oz (28 g) per day.   Eat lower-sodium products, often labeled as "lower sodium" or "no salt added."   Hypokalemia Hypokalemia means that the amount of potassium in the blood is lower than normal.Potassium is a chemical, called an electrolyte, that helps regulate the amount of fluid in the body. It also stimulates muscle contraction and helps nerves function properly.Most of the body's potassium is inside of cells, and only a very small amount is in the blood. Because the amount in the blood is so small, minor changes can be life-threatening. CAUSES  Antibiotics.  Diarrhea or vomiting.  Using laxatives too much, which can cause diarrhea.  Chronic  kidney disease.  Water pills (diuretics).  Eating disorders (bulimia).  Low magnesium level.  Sweating a lot. SIGNS AND SYMPTOMS  Weakness.  Constipation.  Fatigue.  Muscle cramps.  Mental confusion.  Skipped heartbeats or irregular heartbeat (palpitations).  Tingling or numbness. DIAGNOSIS  Your health care provider can diagnose hypokalemia with blood tests. In addition to checking your potassium level, your health care provider may also check other lab tests. TREATMENT Hypokalemia can be treated with potassium supplements taken by mouth or adjustments in your current medicines. If your potassium level is very low, you may need to get potassium through a vein (IV) and be monitored in the hospital. A diet high in potassium is also helpful. Foods high in potassium are:  Nuts, such as peanuts and pistachios.  Seeds, such as sunflower seeds and pumpkin seeds.  Peas, lentils, and lima beans.  Whole grain and bran cereals and breads.  Fresh fruit and vegetables, such as apricots, avocado, bananas, cantaloupe, kiwi, oranges, tomatoes, asparagus, and potatoes.  Orange and tomato juices.  Red meats.  Fruit yogurt. HOME CARE INSTRUCTIONS  Take all medicines as prescribed by your health care provider.  Maintain a healthy diet by including nutritious food, such as fruits, vegetables, nuts, whole grains, and lean meats.  If you are taking a laxative, be sure to follow the directions on the label. SEEK MEDICAL CARE IF:  Your weakness gets worse.  You feel your heart pounding or racing.  You are vomiting or having diarrhea.  You are diabetic and having trouble keeping your blood glucose in the normal range. SEEK IMMEDIATE MEDICAL CARE IF:  You have chest pain, shortness of breath, or dizziness.  You are vomiting or having diarrhea for more than 2 days.  You faint. MAKE SURE YOU:   Understand these instructions.  Will watch your condition.  Will get help  right away if you are not doing well or get worse. Document Released: 07/09/2005 Document Revised: 04/29/2013 Document Reviewed: 01/09/2013 [facility] Patient Information 2015 [place]. This information is not intended to replace advice given to you by your health care provider. Make sure you discuss any questions you have with your health care provider.   Avoid foods that contain monosodium glutamate (MSG). MSG is sometimes added to [place] food and some canned foods.  Check food labels (Nutrition Facts labels) on foods to learn how much sodium is in one serving.  Eat more home-cooked food and less restaurant, buffet, and fast food.  When eating at a restaurant, ask that your food be prepared with less salt or none, if possible.  HOW DO I READ FOOD LABELS FOR SODIUM INFORMATION? The Nutrition Facts label lists the amount of sodium in one serving of the food. If you eat more than one serving, you must multiply the listed amount of sodium by the number of servings. Food labels may also identify foods as:  Sodium free--Less than 5 mg in a serving.  Very low sodium--35 mg or less in a serving.  Low sodium--140 mg or less in a serving.  Light in sodium--50% less sodium in a serving. For example, if a food that usually has 300 mg of sodium is changed to become light in sodium, it will have 150 mg of sodium.  Reduced sodium--25% less sodium in a serving. For example, if a food that usually has 400 mg of sodium is changed to reduced sodium, it will have 300 mg of sodium. WHAT FOODS CAN I EAT? Grains Low-sodium cereals, including oats, puffed wheat and rice, and shredded wheat cereals. Low-sodium crackers. Unsalted rice and pasta. Lower-sodium bread.  Vegetables Frozen or fresh vegetables. Low-sodium or reduced-sodium canned vegetables. Low-sodium or reduced-sodium tomato sauce and paste. Low-sodium or reduced-sodium tomato and vegetable juices.  Fruits Fresh, frozen, and canned  fruit. Fruit juice.  Meat and Other Protein Products Low-sodium canned tuna and salmon. Fresh or frozen meat, poultry, seafood, and fish. Lamb. Unsalted nuts. Dried beans, peas, and lentils without added salt. Unsalted canned beans. Homemade soups without salt. Eggs.  Dairy Milk. Soy milk. Ricotta cheese. Low-sodium or reduced-sodium cheeses. Yogurt.  Condiments Fresh and dried herbs and spices. Salt-free seasonings. Onion and garlic powders. Low-sodium varieties of mustard and ketchup. Lemon juice.  Fats and Oils Reduced-sodium salad dressings. Unsalted butter.  Other Unsalted popcorn and pretzels.  The items listed above may not be a complete list of recommended foods or beverages. Contact your dietitian for more options. WHAT FOODS ARE NOT RECOMMENDED? Grains Instant hot cereals. Bread stuffing, pancake, and biscuit mixes. Croutons. Seasoned rice or pasta mixes. Noodle soup cups. Boxed or frozen macaroni and cheese. Self-rising flour. Regular salted crackers. Vegetables Regular canned vegetables. Regular canned tomato sauce and paste. Regular tomato and vegetable juices. Frozen vegetables in sauces. Salted french fries. Olives. Rosita FirePickles. Relishes. Sauerkraut. Salsa. Meat and Other Protein Products Salted, canned, smoked, spiced, or pickled meats, seafood, or fish. Bacon, ham, sausage, hot dogs, corned beef, chipped beef, and packaged luncheon meats. Salt pork. Jerky. Pickled herring. Anchovies, regular  canned tuna, and sardines. Salted nuts. Dairy Processed cheese and cheese spreads. Cheese curds. Blue cheese and cottage cheese. Buttermilk.  Condiments Onion and garlic salt, seasoned salt, table salt, and sea salt. Canned and packaged gravies. Worcestershire sauce. Tartar sauce. Barbecue sauce. Teriyaki sauce. Soy sauce, including reduced sodium. Steak sauce. Fish sauce. Oyster sauce. Cocktail sauce. Horseradish. Regular ketchup and mustard. Meat flavorings and tenderizers.  Bouillon cubes. Hot sauce. Tabasco sauce. Marinades. Taco seasonings. Relishes. Fats and Oils Regular salad dressings. Salted butter. Margarine. Ghee. Bacon fat.  Other Potato and tortilla chips. Corn chips and puffs. Salted popcorn and pretzels. Canned or dried soups. Pizza. Frozen entrees and pot pies.  The items listed above may not be a complete list of foods and beverages to avoid. Contact your dietitian for more information. Document Released: 12/29/2001 Document Revised: 07/14/2013 Document Reviewed: 05/13/2013 [facility] Patient Information 2015 [place], [place]. This information is not intended to replace advice given to you by your health care provider. Make sure you discuss any questions you have with your health care provider.

## 2014-06-09 DIAGNOSIS — E876 Hypokalemia: Secondary | ICD-10-CM | POA: Insufficient documentation

## 2014-06-16 ENCOUNTER — Other Ambulatory Visit: Payer: Self-pay | Admitting: Internal Medicine

## 2014-08-27 ENCOUNTER — Other Ambulatory Visit: Payer: Self-pay

## 2014-08-27 DIAGNOSIS — I10 Essential (primary) hypertension: Secondary | ICD-10-CM

## 2014-08-27 MED ORDER — LISINOPRIL 20 MG PO TABS: 20.0000 mg | ORAL_TABLET | Freq: Every day | ORAL | Status: DC

## 2014-08-29 ENCOUNTER — Ambulatory Visit (INDEPENDENT_AMBULATORY_CARE_PROVIDER_SITE_OTHER): Payer: Managed Care, Other (non HMO) | Admitting: Physician Assistant

## 2014-08-29 VITALS — BP 130/80 | HR 89 | Temp 98.6°F | Resp 16 | Ht 64.0 in | Wt 180.0 lb

## 2014-08-29 DIAGNOSIS — B9789 Other viral agents as the cause of diseases classified elsewhere: Principal | ICD-10-CM

## 2014-08-29 DIAGNOSIS — J040 Acute laryngitis: Secondary | ICD-10-CM

## 2014-08-29 DIAGNOSIS — J069 Acute upper respiratory infection, unspecified: Secondary | ICD-10-CM

## 2014-08-29 DIAGNOSIS — I10 Essential (primary) hypertension: Secondary | ICD-10-CM

## 2014-08-29 MED ORDER — IPRATROPIUM BROMIDE 0.03 % NA SOLN: 2.0000 | Freq: Two times a day (BID) | NASAL | Status: DC

## 2014-08-29 MED ORDER — HYDROCOD POLST-CHLORPHEN POLST 10-8 MG/5ML PO LQCR: 5.0000 mL | Freq: Two times a day (BID) | ORAL | Status: DC | PRN

## 2014-08-29 MED ORDER — AMLODIPINE BESYLATE 10 MG PO TABS: 10.0000 mg | ORAL_TABLET | Freq: Every day | ORAL | Status: DC

## 2014-08-29 MED ORDER — BENZONATATE 100 MG PO CAPS: 100.0000 mg | ORAL_CAPSULE | Freq: Three times a day (TID) | ORAL | Status: DC | PRN

## 2014-08-29 NOTE — Patient Instructions (Signed)
Take cough syrup at night for cough. Tessalon perles for cough during the day. Nasal spray twice a day for congestion. Drink plenty of water, get plenty of rest.  Rest your voice and drink hot tea or hot water with lemon or honey. Return in 7-10 days if symptoms are not improving.

## 2014-08-29 NOTE — Progress Notes (Signed)
Subjective:    Patient ID: Ricky Diaz, male    DOB: 1975-09-24, 39 y.o.   MRN: [id number]  HPI  This is a 39 year old male with PMH HTN who is presenting with cough, congestion and hoarseness x 2 days. Cough is dry and worse at night. He is having clear nasal discharge. He states with illnesses he commonly gets hoarse. He denies sore throat, fever, chills, otalgia, sinus pressure, SOB or wheezing. No history of asthma and not a smoker. Has taken advil sinus and cold, with temporary relief.   Pt is needing a refill of his amlodipine today. He is stable with his blood pressure, no problems.  Review of Systems  Constitutional: Negative for fever and chills.  HENT: Positive for congestion and voice change. Negative for ear pain, sinus pressure and sore throat.   Eyes: Negative for redness.  Respiratory: Positive for cough. Negative for shortness of breath and wheezing.   Gastrointestinal: Negative for nausea and vomiting.  Skin: Negative for rash.  Neurological: Negative for headaches.  Hematological: Negative for adenopathy.  Psychiatric/Behavioral: Positive for sleep disturbance.    Patient Active Problem List   Diagnosis Date Noted  . Hypokalemia 06/09/2014  . HTN (hypertension) 08/06/2012   Prior to Admission medications   Medication Sig Start Date End Date Taking? Authorizing Provider  amLODipine (NORVASC) 10 MG tablet Take 1 tablet (10 mg total) by mouth daily. PATIENT NEEDS OFFICE VISIT FOR ADDITIONAL REFILLS 06/08/14  Yes Renee A Kuneff, DO  labetalol (NORMODYNE) 200 MG tablet TAKE 1 TABLET TWICE A DAY 06/16/14  Yes Shade Flood, MD  lisinopril (PRINIVIL,ZESTRIL) 20 MG tablet Take 1 tablet (20 mg total) by mouth daily. 08/27/14  Yes Shade Flood, MD  potassium chloride SA (KLOR-CON M20) 20 MEQ tablet Take 1 tablet (20 mEq total) by mouth 2 (two) times daily. 12/29/13  Yes Jonita Albee, MD  spironolactone (ALDACTONE) 25 MG tablet Take 25 mg by mouth 2 (two) times  daily.   Yes Historical Provider, MD   No Known Allergies  Patient's social and family history were reviewed.     Objective:   Physical Exam  Constitutional: He is oriented to person, place, and time. He appears well-developed and well-nourished. No distress.  HENT:  Head: Normocephalic and atraumatic.  Right Ear: Hearing, tympanic membrane, external ear and ear canal normal.  Left Ear: Hearing, tympanic membrane, external ear and ear canal normal.  Nose: Nose normal. Right sinus exhibits no maxillary sinus tenderness and no frontal sinus tenderness. Left sinus exhibits no maxillary sinus tenderness and no frontal sinus tenderness.  Mouth/Throat: Uvula is midline, oropharynx is clear and moist and mucous membranes are normal.  Voice hoarse  Eyes: Conjunctivae and lids are normal. Right eye exhibits no discharge. Left eye exhibits no discharge. No scleral icterus.  Cardiovascular: Normal rate, regular rhythm, normal heart sounds, intact distal pulses and normal pulses.   No murmur heard. Pulmonary/Chest: Effort normal. No respiratory distress. He has no wheezes. He has no rhonchi. He has no rales.  Musculoskeletal: Normal range of motion.  Lymphadenopathy:       Head (right side): Submandibular adenopathy present. No submental, no tonsillar, no preauricular, no posterior auricular and no occipital adenopathy present.       Head (left side): Submandibular adenopathy present. No submental, no tonsillar, no preauricular, no posterior auricular and no occipital adenopathy present.    He has no cervical adenopathy.  Neurological: He is alert and oriented to person,  place, and time.  Skin: Skin is warm, dry and intact. No lesion and no rash noted.  Psychiatric: He has a normal mood and affect. His speech is normal and behavior is normal. Thought content normal.   BP 130/80 mmHg  Pulse 89  Temp(Src) 98.6 F (37 C) (Oral)  Resp 16  Ht 5\' 4"  (1.626 m)  Wt 180 lb (81.647 kg)  BMI 30.88  kg/m2  SpO2 99%     Assessment & Plan:  1. Viral URI with cough 2. Laryngitis This is likely a viral illness. Focus is on supportive care, see meds prescribed below. He will follow up in 7-10 days if not improving. - chlorpheniramine-HYDROcodone (TUSSIONEX PENNKINETIC ER) 10-8 MG/5ML LQCR; Take 5 mLs by mouth every 12 (twelve) hours as needed for cough (cough).  Dispense: 80 mL; Refill: 0 - ipratropium (ATROVENT) 0.03 % nasal spray; Place 2 sprays into both nostrils 2 (two) times daily.  Dispense: 30 mL; Refill: 0 - benzonatate (TESSALON) 100 MG capsule; Take 1-2 capsules (100-200 mg total) by mouth 3 (three) times daily as needed for cough.  Dispense: 40 capsule; Refill: 0  3. Essential hypertension Stable. Refilled amlodipine. - amLODipine (NORVASC) 10 MG tablet; Take 1 tablet (10 mg total) by mouth daily.  Dispense: 30 tablet; Refill: 5   Nicole V. Dyke BrackettBush, PA-C, MHS [facility]  08/29/2014

## 2014-08-30 ENCOUNTER — Other Ambulatory Visit: Payer: Self-pay

## 2014-08-30 ENCOUNTER — Encounter: Payer: Self-pay | Admitting: Adult Health

## 2014-08-30 DIAGNOSIS — I10 Essential (primary) hypertension: Secondary | ICD-10-CM

## 2014-08-30 MED ORDER — LISINOPRIL 20 MG PO TABS: 20.0000 mg | ORAL_TABLET | Freq: Every day | ORAL | Status: DC

## 2014-09-02 ENCOUNTER — Other Ambulatory Visit: Payer: Self-pay | Admitting: Family Medicine

## 2014-09-22 ENCOUNTER — Other Ambulatory Visit: Payer: Self-pay

## 2014-09-22 DIAGNOSIS — I10 Essential (primary) hypertension: Secondary | ICD-10-CM

## 2014-09-22 MED ORDER — AMLODIPINE BESYLATE 10 MG PO TABS: 10.0000 mg | ORAL_TABLET | Freq: Every day | ORAL | Status: DC

## 2014-11-12 ENCOUNTER — Encounter: Payer: Self-pay | Admitting: Neurology

## 2014-11-17 ENCOUNTER — Other Ambulatory Visit: Payer: Self-pay

## 2014-11-17 DIAGNOSIS — I10 Essential (primary) hypertension: Secondary | ICD-10-CM

## 2014-11-17 MED ORDER — LISINOPRIL 20 MG PO TABS: 20.0000 mg | ORAL_TABLET | Freq: Every day | ORAL | Status: DC

## 2014-11-22 ENCOUNTER — Ambulatory Visit (INDEPENDENT_AMBULATORY_CARE_PROVIDER_SITE_OTHER): Payer: Managed Care, Other (non HMO) | Admitting: Physician Assistant

## 2014-11-22 VITALS — BP 120/78 | HR 75 | Temp 98.1°F | Resp 14 | Ht 63.25 in | Wt 180.4 lb

## 2014-11-22 DIAGNOSIS — E876 Hypokalemia: Secondary | ICD-10-CM

## 2014-11-22 DIAGNOSIS — I10 Essential (primary) hypertension: Secondary | ICD-10-CM | POA: Diagnosis not present

## 2014-11-22 DIAGNOSIS — R739 Hyperglycemia, unspecified: Secondary | ICD-10-CM

## 2014-11-22 LAB — BASIC METABOLIC PANEL
BUN: 15 mg/dL (ref 6–23)
CALCIUM: 10.2 mg/dL (ref 8.4–10.5)
CO2: 26 meq/L (ref 19–32)
CREATININE: 1.23 mg/dL (ref 0.50–1.35)
Chloride: 105 mEq/L (ref 96–112)
Glucose, Bld: 103 mg/dL — ABNORMAL HIGH (ref 70–99)
POTASSIUM: 4.6 meq/L (ref 3.5–5.3)
Sodium: 139 mEq/L (ref 135–145)

## 2014-11-22 LAB — POCT GLYCOSYLATED HEMOGLOBIN (HGB A1C): HEMOGLOBIN A1C: 5.9

## 2014-11-22 MED ORDER — SPIRONOLACTONE 25 MG PO TABS: 25.0000 mg | ORAL_TABLET | Freq: Two times a day (BID) | ORAL | Status: DC

## 2014-11-22 MED ORDER — LISINOPRIL 20 MG PO TABS: 20.0000 mg | ORAL_TABLET | Freq: Every day | ORAL | Status: DC

## 2014-11-22 MED ORDER — AMLODIPINE BESYLATE 10 MG PO TABS: 10.0000 mg | ORAL_TABLET | Freq: Every day | ORAL | Status: DC

## 2014-11-22 MED ORDER — LABETALOL HCL 200 MG PO TABS: 200.0000 mg | ORAL_TABLET | Freq: Two times a day (BID) | ORAL | Status: DC

## 2014-11-22 MED ORDER — POTASSIUM CHLORIDE CRYS ER 20 MEQ PO TBCR: 20.0000 meq | EXTENDED_RELEASE_TABLET | Freq: Two times a day (BID) | ORAL | Status: DC

## 2014-11-22 NOTE — Progress Notes (Signed)
Subjective:    Patient ID: Ricky Diaz, male    DOB: Feb 09, 1976, 39 y.o.   MRN: [id number]  Chief Complaint  Patient presents with  . Medication Refill  . Labs Only    Glucose level check   Patient Active Problem List   Diagnosis Date Noted  . Hypokalemia 06/09/2014  . HTN (hypertension) 08/06/2012   Prior to Admission medications   Medication Sig Start Date End Date Taking? Authorizing Provider  amLODipine (NORVASC) 10 MG tablet Take 1 tablet (10 mg total) by mouth daily. 11/22/14  Yes Huey Bienenstock Refugio Vandevoorde, PA  labetalol (NORMODYNE) 200 MG tablet Take 1 tablet (200 mg total) by mouth 2 (two) times daily. 11/22/14  Yes Huey Bienenstock Danyah Guastella, PA  lisinopril (PRINIVIL,ZESTRIL) 20 MG tablet Take 1 tablet (20 mg total) by mouth daily. 11/22/14  Yes Huey Bienenstock Singleton Hickox, PA  potassium chloride SA (KLOR-CON M20) 20 MEQ tablet Take 1 tablet (20 mEq total) by mouth 2 (two) times daily. 11/22/14  Yes Huey Bienenstock Daneya Hartgrove, PA  spironolactone (ALDACTONE) 25 MG tablet Take 1 tablet (25 mg total) by mouth 2 (two) times daily. 11/22/14  Yes Huey Bienenstock Lamisha Roussell, PA   Medications, allergies, past medical history, surgical history, family history, social history and problem list reviewed and updated.  HPI  38 yom with pmh htn and hypokalemia presents for med refills.   Has been on several bp meds for many yrs. Was referred to nephrology last year for persistent htn and low k 3.2. Per pt nephro started him on spironolactone. Has been taking his bp meds as prescribed:  Labetalol 200 mg bid, lisinopril 20 mg qd, spironolactone 25 mg bid, amlodipine 10 mg qd. Due to hx low k he has been taking KCl 20 meq qd. When he saw nephro 9/15 they ordered ct abd which showed normal kidneys and normal adrenals. He will be seeing them again in 6 months.   Pt states when he saw nephro 2 months ago he was told his bg was high and he needs to have this checked. Since hearing this has started exercising, walking on TM 3x week. He used to drink soda  daily but has stopped doing this since found out that his bg was high. Down 11#.   Review of Systems Denies cp, sob, palps, presyncope, syncope, vision changes, persistent HAs    Objective:   Physical Exam  Constitutional: He appears well-developed and well-nourished.  Non-toxic appearance. He does not have a sickly appearance. He does not appear ill. No distress.  BP 120/78 mmHg  Pulse 75  Temp(Src) 98.1 F (36.7 C) (Oral)  Resp 14  Ht 5' 3.25" (1.607 m)  Wt 180 lb 6.4 oz (81.829 kg)  BMI 31.69 kg/m2  SpO2 96%   Cardiovascular: Normal rate, regular rhythm and normal heart sounds.   Pulmonary/Chest: Effort normal and breath sounds normal.   Results for orders placed or performed in visit on 11/22/14  POCT glycosylated hemoglobin (Hb A1C)  Result Value Ref Range   Hemoglobin A1C 5.9       Assessment & Plan:   38 yom with pmh htn and hypokalemia presents for med refills.   Essential hypertension - Plan: Basic metabolic panel, amLODipine (NORVASC) 10 MG tablet, labetalol (NORMODYNE) 200 MG tablet, lisinopril (PRINIVIL,ZESTRIL) 20 MG tablet, spironolactone (ALDACTONE) 25 MG tablet --refilled meds today, planning to f/u with nephro 6 months, defer management to them  Hypokalemia - Plan: Basic metabolic panel, potassium chloride SA (KLOR-CON M20) 20 MEQ tablet --  refilled kcl, checking bmp today  Blood glucose elevated - Plan: POCT glycosylated hemoglobin (Hb A1C) --A1C 5.9 in clinic --discussed pre-diabetes with pt and given information on exercise and discussed limiting sugar intake --recheck 6 mon - 1 year  Donnajean Lopesodd M. Wilene Pharo, PA-C Physician Assistant-Certified [facility]  11/23/2014 8:28 PM

## 2014-11-22 NOTE — Patient Instructions (Signed)
Your blood sugar reading was in the pre-diabetic range today. Continue to do what you're doing with the exercise, limiting sweets, and avoiding soda.  I've refilled your blood pressure medications for 6 months today. You should stay on all 4 at this time as your bp is right where we want it.  Please plan to see the kidney doctors in 6 months as planned.  We are checking a lab today to check your kidney function and your potassium level. I'll let you know the results of this.   Diabetes and Exercise Exercising regularly is important. It is not just about losing weight. It has many health benefits, such as:  Improving your overall fitness, flexibility, and endurance.  Increasing your bone density.  Helping with weight control.  Decreasing your body fat.  Increasing your muscle strength.  Reducing stress and tension.  Improving your overall health. People with diabetes who exercise gain additional benefits because exercise:  Reduces appetite.  Improves the body's use of blood sugar (glucose).  Helps lower or control blood glucose.  Decreases blood pressure.  Helps control blood lipids (such as cholesterol and triglycerides).  Improves the body's use of the hormone insulin by:  Increasing the body's insulin sensitivity.  Reducing the body's insulin needs.  Decreases the risk for heart disease because exercising:  Lowers cholesterol and triglycerides levels.  Increases the levels of good cholesterol (such as high-density lipoproteins [HDL]) in the body.  Lowers blood glucose levels. YOUR ACTIVITY PLAN  Choose an activity that you enjoy and set realistic goals. Your health care provider or diabetes educator can help you make an activity plan that works for you. Exercise regularly as directed by your health care provider. This includes:  Performing resistance training twice a week such as push-ups, sit-ups, lifting weights, or using resistance bands.  Performing 150  minutes of cardio exercises each week such as walking, running, or playing sports.  Staying active and spending no more than 90 minutes at one time being inactive. Even short bursts of exercise are good for you. Three 10-minute sessions spread throughout the day are just as beneficial as a single 30-minute session. Some exercise ideas include:  Taking the dog for a walk.  Taking the stairs instead of the elevator.  Dancing to your favorite song.  Doing an exercise video.  Doing your favorite exercise with a friend. RECOMMENDATIONS FOR EXERCISING WITH TYPE 1 OR TYPE 2 DIABETES   Check your blood glucose before exercising. If blood glucose levels are greater than 240 mg/dL, check for urine ketones. Do not exercise if ketones are present.  Avoid injecting insulin into areas of the body that are going to be exercised. For example, avoid injecting insulin into:  The arms when playing tennis.  The legs when jogging.  Keep a record of:  Food intake before and after you exercise.  Expected peak times of insulin action.  Blood glucose levels before and after you exercise.  The type and amount of exercise you have done.  Review your records with your health care provider. Your health care provider will help you to develop guidelines for adjusting food intake and insulin amounts before and after exercising.  If you take insulin or oral hypoglycemic agents, watch for signs and symptoms of hypoglycemia. They include:  Dizziness.  Shaking.  Sweating.  Chills.  Confusion.  Drink plenty of water while you exercise to prevent dehydration or heat stroke. Body water is lost during exercise and must be replaced.  Talk to  your health care provider before starting an exercise program to make sure it is safe for you. Remember, almost any type of activity is better than none. Document Released: 09/29/2003 Document Revised: 11/23/2013 Document Reviewed: 12/16/2012 [facility] Patient  Information 2015 [place], [place]. This information is not intended to replace advice given to you by your health care provider. Make sure you discuss any questions you have with your health care provider.

## 2014-12-14 ENCOUNTER — Other Ambulatory Visit: Payer: Self-pay | Admitting: Family Medicine

## 2015-01-05 ENCOUNTER — Ambulatory Visit: Payer: Managed Care, Other (non HMO) | Admitting: Adult Health

## 2015-01-26 ENCOUNTER — Encounter: Payer: Self-pay | Admitting: Adult Health

## 2015-01-26 ENCOUNTER — Ambulatory Visit (INDEPENDENT_AMBULATORY_CARE_PROVIDER_SITE_OTHER): Payer: Managed Care, Other (non HMO) | Admitting: Adult Health

## 2015-01-26 VITALS — BP 119/76 | HR 78 | Ht 63.0 in | Wt 184.0 lb

## 2015-01-26 DIAGNOSIS — G4733 Obstructive sleep apnea (adult) (pediatric): Secondary | ICD-10-CM | POA: Diagnosis not present

## 2015-01-26 DIAGNOSIS — G479 Sleep disorder, unspecified: Secondary | ICD-10-CM | POA: Insufficient documentation

## 2015-01-26 DIAGNOSIS — Z9989 Dependence on other enabling machines and devices: Principal | ICD-10-CM

## 2015-01-26 NOTE — Progress Notes (Signed)
PATIENT: Ricky Diaz DOB: April 10, 1976  REASON FOR VISIT: follow up- OSA on CPAP HISTORY FROM: patient  HISTORY OF PRESENT ILLNESS: Ricky Diaz is a 39 year old male with a history of OSA on CPAP referred to this office by Dr Nilda SimmerKristi [name].. He returns today for follow-up. Patient brought his machine with him today for a compliance download. He uses his machine 30 out of 30 days for compliance of 100%. He uses machine greater than 4 hours --30 out of 30 days for compliance of 100%. On average he uses his machine 8 hours and 19 minutes. His AHI is 0.8 at 12 cm of water. The patient has a leak in the 95th percentile at 37.8L/min. the patient typically goes to bed at 1AM  and arises at [facility] . Patient works 2nd shift job. He denies any trouble falling or staying asleep. He rarley has to get up during the night. His Epworth sleepiness score is 1  fatigue severity score is 15. Overall he feels that the CPAP has been beneficial. He returns today for an evaluation.  REVIEW OF SYSTEMS: Out of a complete 14 system review of symptoms, the patient complains only of the following symptoms, and all other reviewed systems are negative.  See HPI  ALLERGIES: No Known Allergies  HOME MEDICATIONS: Outpatient Prescriptions Prior to Visit  Medication Sig Dispense Refill  . amLODipine (NORVASC) 10 MG tablet Take 1 tablet (10 mg total) by mouth daily. 90 tablet 1  . labetalol (NORMODYNE) 200 MG tablet Take 1 tablet (200 mg total) by mouth 2 (two) times daily. 60 tablet 5  . lisinopril (PRINIVIL,ZESTRIL) 20 MG tablet Take 1 tablet (20 mg total) by mouth daily. 90 tablet 1  . potassium chloride SA (KLOR-CON M20) 20 MEQ tablet Take 1 tablet (20 mEq total) by mouth 2 (two) times daily. 180 tablet 2  . spironolactone (ALDACTONE) 25 MG tablet Take 1 tablet (25 mg total) by mouth 2 (two) times daily. 60 tablet 5   No facility-administered medications prior to visit.    PAST MEDICAL HISTORY: Past Medical  History  Diagnosis Date  . Hypertension     onset age 39.    PAST SURGICAL HISTORY: No past surgical history on file.  FAMILY HISTORY: Family History  Problem Relation Age of Onset  . Hypertension Brother   . Sleep apnea Brother   . Sleep apnea Brother     SOCIAL HISTORY: History   Social History  . Marital Status: Married    Spouse Name: [name]  . Number of Children: 4  . Years of Education: 14   Occupational History  .  [employer]   Social History Main Topics  . Smoking status: Never Smoker   . Smokeless tobacco: Never Used  . Alcohol Use: Yes     Comment: rare  . Drug Use: No  . Sexual Activity:    Partners: Female   Other Topics Concern  . Not on file   Social History Narrative   Marital status: married x 2007. From [place] since 2000.      Children: 4 children; no grandchildren.      Lives: with wife ([name]) , children.      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      Tobacco: none      Alcohol: none      Drugs: none      Exercise:  Sporadic.   Patient is right-handed.  Patient has a college education.   Patient drinks very little caffeine.      PHYSICAL EXAM  Filed Vitals:   01/26/15 0927  BP: 119/76  Pulse: 78  Height:  (1.6 m)  Weight: 184 lb (83.462 kg)   Body mass index is 32.6 kg/(m^2).   Generalized: Well developed, in no acute distress  Neck: Circumference 17 inches, Mallampati 3+  Neurological examination  Mentation: Alert oriented to time, place, history taking. Follows all commands speech and language fluent Cranial nerve II-XII: Pupils were equal round reactive to light. Extraocular movements were full, visual field were full on confrontational test. Facial sensation and strength were normal. Uvula tongue midline. . Motor: The motor testing reveals 5 over 5 strength of all 4 extremities. Good symmetric motor tone is noted throughout.  Sensory: Sensory testing is intact to  soft touch on all 4 extremities. No evidence of extinction is noted.  Coordination: Cerebellar testing reveals good finger-nose-finger and heel-to-shin bilaterally.  Gait and station: Gait is normal.  Reflexes: Deep tendon reflexes are symmetric and normal bilaterally.      DIAGNOSTIC DATA (LABS, IMAGING, TESTING) - I reviewed patient records, labs, notes, testing and imaging myself where available.      ASSESSMENT AND PLAN 39 y.o. year old male  has a past medical history of Hypertension. here with:  1. OSA on CPAP  The patient CPAP download shows excellent compliance. He should continue using his CPAP nightly. The patient would like a new machine. I will fax an order to his DME company for new supplies and machine. Patient advised that if his symptoms worsen or he develops new symptoms he should let us know. Otherwise he will follow-up in one year or sooner if needed.   Butch Penny, MSN, NP-C 01/26/2015, 9:30 AM [facility] [address] [place], [place] [address] [phone]  Note: This document was prepared with digital dictation and possible smart phrase technology. Any transcriptional errors that result from this process are unintentional.

## 2015-01-26 NOTE — Patient Instructions (Addendum)
Continue to use CPAP nightly. I will send order to your DME company for new machine and supplies.

## 2015-02-24 NOTE — Progress Notes (Signed)
I have read the note, and I agree with the clinical assessment and plan.  Haylie Mccutcheon A. Brooklynn Brandenburg, MD, PhD 

## 2015-03-09 ENCOUNTER — Other Ambulatory Visit: Payer: Self-pay

## 2015-03-09 DIAGNOSIS — I10 Essential (primary) hypertension: Secondary | ICD-10-CM

## 2015-03-09 MED ORDER — LABETALOL HCL 200 MG PO TABS: 200.0000 mg | ORAL_TABLET | Freq: Two times a day (BID) | ORAL | Status: DC

## 2015-03-23 ENCOUNTER — Ambulatory Visit (INDEPENDENT_AMBULATORY_CARE_PROVIDER_SITE_OTHER): Payer: Managed Care, Other (non HMO)

## 2015-03-23 ENCOUNTER — Ambulatory Visit (INDEPENDENT_AMBULATORY_CARE_PROVIDER_SITE_OTHER): Payer: Managed Care, Other (non HMO) | Admitting: Emergency Medicine

## 2015-03-23 VITALS — BP 128/82 | HR 80 | Temp 98.6°F | Resp 16 | Ht 63.0 in | Wt 188.0 lb

## 2015-03-23 DIAGNOSIS — K2970 Gastritis, unspecified, without bleeding: Secondary | ICD-10-CM | POA: Diagnosis not present

## 2015-03-23 DIAGNOSIS — D72819 Decreased white blood cell count, unspecified: Secondary | ICD-10-CM | POA: Diagnosis not present

## 2015-03-23 DIAGNOSIS — R1010 Upper abdominal pain, unspecified: Secondary | ICD-10-CM

## 2015-03-23 DIAGNOSIS — K5909 Other constipation: Secondary | ICD-10-CM

## 2015-03-23 DIAGNOSIS — I10 Essential (primary) hypertension: Secondary | ICD-10-CM

## 2015-03-23 DIAGNOSIS — K2990 Gastroduodenitis, unspecified, without bleeding: Secondary | ICD-10-CM | POA: Diagnosis not present

## 2015-03-23 LAB — POCT URINALYSIS DIPSTICK
BILIRUBIN UA: NEGATIVE
Glucose, UA: NEGATIVE
Ketones, UA: NEGATIVE
Leukocytes, UA: NEGATIVE
NITRITE UA: NEGATIVE
PH UA: 5
PROTEIN UA: NEGATIVE
RBC UA: NEGATIVE
Spec Grav, UA: 1.01
UROBILINOGEN UA: 0.2

## 2015-03-23 LAB — BASIC METABOLIC PANEL WITH GFR
BUN: 13 mg/dL (ref 7–25)
CALCIUM: 10.2 mg/dL (ref 8.6–10.3)
CO2: 27 mmol/L (ref 20–31)
Chloride: 105 mmol/L (ref 98–110)
Creat: 1.16 mg/dL (ref 0.60–1.35)
GFR, EST NON AFRICAN AMERICAN: 78 mL/min (ref >=60)
GLUCOSE: 88 mg/dL (ref 65–99)
Potassium: 5 mmol/L (ref 3.5–5.3)
Sodium: 140 mmol/L (ref 135–146)

## 2015-03-23 LAB — POCT CBC
GRANULOCYTE PERCENT: 52.2 % (ref 37–80)
HEMATOCRIT: 38.3 % — AB (ref 43.5–53.7)
HEMOGLOBIN: 12.5 g/dL — AB (ref 14.1–18.1)
Lymph, poc: 1.5 (ref 0.6–3.4)
MCH: 29.3 pg (ref 27–31.2)
MCHC: 32.7 g/dL (ref 31.8–35.4)
MCV: 89.8 fL (ref 80–97)
MID (cbc): 0.4 (ref 0–0.9)
MPV: 8.1 fL (ref 0–99.8)
POC GRANULOCYTE: 2 (ref 2–6.9)
POC LYMPH PERCENT: 38.3 %L (ref 10–50)
POC MID %: 9.5 % (ref 0–12)
Platelet Count, POC: 192 10*3/uL (ref 142–424)
RBC: 4.27 M/uL — AB (ref 4.69–6.13)
RDW, POC: 14 %
WBC: 3.9 10*3/uL — AB (ref 4.6–10.2)

## 2015-03-23 LAB — POCT UA - MICROSCOPIC ONLY
Bacteria, U Microscopic: NEGATIVE
CASTS, UR, LPF, POC: NEGATIVE
Crystals, Ur, HPF, POC: NEGATIVE
Epithelial cells, urine per micros: NEGATIVE
MUCUS UA: NEGATIVE
RBC, urine, microscopic: NEGATIVE
WBC, UR, HPF, POC: NEGATIVE
YEAST UA: NEGATIVE

## 2015-03-23 LAB — HIV ANTIBODY (ROUTINE TESTING W REFLEX): HIV: NONREACTIVE

## 2015-03-23 MED ORDER — LISINOPRIL 20 MG PO TABS: 20.0000 mg | ORAL_TABLET | Freq: Every day | ORAL | Status: DC

## 2015-03-23 MED ORDER — LABETALOL HCL 200 MG PO TABS: 200.0000 mg | ORAL_TABLET | Freq: Two times a day (BID) | ORAL | Status: DC

## 2015-03-23 MED ORDER — AMLODIPINE BESYLATE 10 MG PO TABS: 10.0000 mg | ORAL_TABLET | Freq: Every day | ORAL | Status: DC

## 2015-03-23 MED ORDER — SPIRONOLACTONE 25 MG PO TABS: 25.0000 mg | ORAL_TABLET | Freq: Two times a day (BID) | ORAL | Status: DC

## 2015-03-23 MED ORDER — OMEPRAZOLE 40 MG PO CPDR: 40.0000 mg | DELAYED_RELEASE_CAPSULE | Freq: Every day | ORAL | Status: DC

## 2015-03-23 NOTE — Patient Instructions (Signed)

## 2015-03-23 NOTE — Progress Notes (Addendum)
Subjective:  This chart was scribed for Lesle Chris MD,  by Veverly Fells, at [facility].  This patient was seen in room 1 and the patient's care was started at 9:47 AM.    Patient ID: Ricky Diaz, male    DOB: 1976/06/15, 39 y.o.   MRN: [id number] Chief Complaint  Patient presents with  . Abdominal Pain    onset 4 days  . Medication Refill   HPI  HPI Comments: Ricky Diaz is a 39 y.o. male who presents to the [facility] complaining of abdominal pain onset 4 days ago (lasted for 3 days) when he feels hungry.  He denies any pain today and had associated symptoms of constipation when it occurred.  He regularly has bowel movements 2-3 times a day (describing them as hard stool).  He states he had the same symptoms 10-15 years ago when he was taking blood pressure medication.  He has not had any change in his blood pressure medication recently.  Patient denies any back pain, vomiting/nausea, change in diet, chest pain, heart burn symptoms.    Patient is also here for medication refill.    Patient Active Problem List   Diagnosis Date Noted  . Sleep difficulties   . Hypokalemia 06/09/2014  . HTN (hypertension) 08/06/2012   Past Medical History  Diagnosis Date  . Hypertension     onset age 51.  . Sleep difficulties    Past Surgical History  Procedure Laterality Date  . No past surgeries     No Known Allergies Prior to Admission medications   Medication Sig Start Date End Date Taking? Authorizing Provider  amLODipine (NORVASC) 10 MG tablet Take 1 tablet (10 mg total) by mouth daily. 11/22/14   Raelyn Ensign, PA  labetalol (NORMODYNE) 200 MG tablet Take 1 tablet (200 mg total) by mouth 2 (two) times daily. 03/09/15   Raelyn Ensign, PA  lisinopril (PRINIVIL,ZESTRIL) 20 MG tablet Take 1 tablet (20 mg total) by mouth daily. 11/22/14   Todd McVeigh, PA  potassium chloride SA (KLOR-CON M20) 20 MEQ tablet Take 1 tablet (20 mEq total) by  mouth 2 (two) times daily. 11/22/14   Raelyn Ensign, PA  spironolactone (ALDACTONE) 25 MG tablet Take 1 tablet (25 mg total) by mouth 2 (two) times daily. 11/22/14   Raelyn Ensign, PA   Social History   Social History  . Marital Status: Married    Spouse Name: [name]  . Number of Children: 4  . Years of Education: 14   Occupational History  .  [employer]   Social History Main Topics  . Smoking status: Never Smoker   . Smokeless tobacco: Never Used  . Alcohol Use: 0.0 oz/week    0 Standard drinks or equivalent per week     Comment: rare  . Drug Use: No  . Sexual Activity:    Partners: Female   Other Topics Concern  . Not on file   Social History Narrative   Marital status: married x 2007. From [place]; [place] since 2000.      Children: 4 children; no grandchildren.      Lives: with wife ([name]) , children.      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      Tobacco: none      Alcohol: none      Drugs: none      Exercise:  Sporadic.   Patient is right-handed.  Patient has a college education.   Patient drinks very little caffeine.    Review of Systems  Gastrointestinal: Positive for abdominal pain and constipation. Negative for nausea and vomiting.       Objective:   Physical Exam CONSTITUTIONAL: Well developed/well nourished HEAD: Normocephalic/atraumatic EYES: EOMI/PERRL SPINE/BACK:entire spine nontender CV: S1/S2 noted, no murmurs/rubs/gallops noted LUNGS: Lungs are clear to auscultation bilaterally, no apparent distress ABDOMEN: soft, nontender, no rebound or guarding, bowel sounds noted throughout abdomen NEURO: Pt is awake/alert/appropriate, moves all extremitiesx4.  No facial droop.   SKIN: warm, color normal PSYCH: no abnormalities of mood noted, alert and oriented to situation  Filed Vitals:   03/23/15 0920  BP: 128/82  Pulse: 80  Temp: 98.6 F (37 C)  TempSrc: Oral  Resp: 16  Height:  (1.6 m)  Weight: 188 lb  (85.276 kg)  SpO2: 98%   Results for orders placed or performed in visit on 03/23/15  POCT CBC  Result Value Ref Range   WBC 3.9 (A) 4.6 - 10.2 K/uL   Lymph, poc 1.5 0.6 - 3.4   POC LYMPH PERCENT 38.3 10 - 50 %L   MID (cbc) 0.4 0 - 0.9   POC MID % 9.5 0 - 12 %M   POC Granulocyte 2.0 2 - 6.9   Granulocyte percent 52.2 37 - 80 %G   RBC 4.27 (A) 4.69 - 6.13 M/uL   Hemoglobin 12.5 (A) 14.1 - 18.1 g/dL   HCT, POC 86.5 (A) 78.4 - 53.7 %   MCV 89.8 80 - 97 fL   MCH, POC 29.3 27 - 31.2 pg   MCHC 32.7 31.8 - 35.4 g/dL   RDW, POC 69.6 %   Platelet Count, POC 192.0 142 - 424 K/uL   MPV 8.1 0 - 99.8 fL  POCT UA - Microscopic Only  Result Value Ref Range   WBC, Ur, HPF, POC Negative    RBC, urine, microscopic Negative    Bacteria, U Microscopic Negative    Mucus, UA Negative    Epithelial cells, urine per micros Negative    Crystals, Ur, HPF, POC Negative    Casts, Ur, LPF, POC Negative    Yeast, UA Negative   POCT urinalysis dipstick  Result Value Ref Range   Color, UA Light Yellow    Clarity, UA Clear    Glucose, UA Negative    Bilirubin, UA Negative    Ketones, UA Negative    Spec Grav, UA 1.010    Blood, UA Negative    pH, UA 5.0    Protein, UA Negative    Urobilinogen, UA 0.2    Nitrite, UA Negative    Leukocytes, UA Negative Negative   [facility] reading (PRIMARY) by  Dr.Izaiyah Kleinman there is a good amount stool. No acute changes seen.       Assessment & Plan:  There is a mild decrease in his hemoglobin and white cell count platelets being normal. Medications were refilled. I feel his abdominal pain was most likely secondary to constipation. HIV will be checked because of the low white count.I also gave him Prilosec 40 mg in case there is some gastritis. I doubt this. I personally performed the services described in this documentation, which was scribed in my presence. The recorded information has been reviewed and is accurate.

## 2015-06-23 ENCOUNTER — Encounter: Payer: Self-pay | Admitting: *Deleted

## 2015-06-23 DIAGNOSIS — E2609 Other primary hyperaldosteronism: Secondary | ICD-10-CM | POA: Insufficient documentation

## 2015-08-20 ENCOUNTER — Ambulatory Visit (INDEPENDENT_AMBULATORY_CARE_PROVIDER_SITE_OTHER): Payer: Managed Care, Other (non HMO) | Admitting: Family Medicine

## 2015-08-20 VITALS — BP 120/80 | HR 91 | Temp 98.3°F | Resp 16 | Ht 64.0 in | Wt 188.4 lb

## 2015-08-20 DIAGNOSIS — E785 Hyperlipidemia, unspecified: Secondary | ICD-10-CM

## 2015-08-20 DIAGNOSIS — G4733 Obstructive sleep apnea (adult) (pediatric): Secondary | ICD-10-CM

## 2015-08-20 DIAGNOSIS — J069 Acute upper respiratory infection, unspecified: Secondary | ICD-10-CM | POA: Diagnosis not present

## 2015-08-20 DIAGNOSIS — I10 Essential (primary) hypertension: Secondary | ICD-10-CM

## 2015-08-20 DIAGNOSIS — R7302 Impaired glucose tolerance (oral): Secondary | ICD-10-CM

## 2015-08-20 DIAGNOSIS — Z9989 Dependence on other enabling machines and devices: Secondary | ICD-10-CM

## 2015-08-20 DIAGNOSIS — E876 Hypokalemia: Secondary | ICD-10-CM

## 2015-08-20 DIAGNOSIS — D6489 Other specified anemias: Secondary | ICD-10-CM

## 2015-08-20 LAB — POCT CBC
GRANULOCYTE PERCENT: 76.6 % (ref 37–80)
HEMATOCRIT: 37.1 % — AB (ref 43.5–53.7)
HEMOGLOBIN: 12.3 g/dL — AB (ref 14.1–18.1)
Lymph, poc: 1.8 (ref 0.6–3.4)
MCH, POC: 29.8 pg (ref 27–31.2)
MCHC: 33.3 g/dL (ref 31.8–35.4)
MCV: 89.5 fL (ref 80–97)
MID (cbc): 0.3 (ref 0–0.9)
MPV: 7.9 fL (ref 0–99.8)
PLATELET COUNT, POC: 189 10*3/uL (ref 142–424)
POC GRANULOCYTE: 7 — AB (ref 2–6.9)
POC LYMPH PERCENT: 19.7 %L (ref 10–50)
POC MID %: 3.7 %M (ref 0–12)
RBC: 4.14 M/uL — AB (ref 4.69–6.13)
RDW, POC: 13.2 %
WBC: 9.1 10*3/uL (ref 4.6–10.2)

## 2015-08-20 MED ORDER — AMLODIPINE BESYLATE 10 MG PO TABS: 10.0000 mg | ORAL_TABLET | Freq: Every day | ORAL | Status: DC

## 2015-08-20 MED ORDER — PSEUDOEPHEDRINE HCL 30 MG PO TABA: 1.0000 | ORAL_TABLET | Freq: Four times a day (QID) | ORAL | Status: DC

## 2015-08-20 MED ORDER — LABETALOL HCL 200 MG PO TABS: 200.0000 mg | ORAL_TABLET | Freq: Two times a day (BID) | ORAL | Status: DC

## 2015-08-20 MED ORDER — SPIRONOLACTONE 25 MG PO TABS
25.0000 mg | ORAL_TABLET | Freq: Two times a day (BID) | ORAL | Status: DC
Start: 2015-08-20 — End: 2016-02-13

## 2015-08-20 MED ORDER — LISINOPRIL 20 MG PO TABS: 20.0000 mg | ORAL_TABLET | Freq: Every day | ORAL | Status: DC

## 2015-08-20 MED ORDER — POTASSIUM CHLORIDE CRYS ER 20 MEQ PO TBCR: 20.0000 meq | EXTENDED_RELEASE_TABLET | Freq: Two times a day (BID) | ORAL | Status: DC

## 2015-08-20 NOTE — Patient Instructions (Addendum)
1. AFRIN NASAL SPRAY --- 2 SPRAYS INTO EACH NOSTRIL TWICE DAILY FOR NASAL CONGESTION. (Please get at pharmacy over the counter). 2.  PSEUDOEPHEDRINE  -- 1 TABLET FOUR TIMES DAILY FOR NASAL CONGESTION (I sent in prescription).  Upper Respiratory Infection, Adult Most upper respiratory infections (URIs) are a viral infection of the air passages leading to the lungs. A URI affects the nose, throat, and upper air passages. The most common type of URI is nasopharyngitis and is typically referred to as "the common cold." URIs run their course and usually go away on their own. Most of the time, a URI does not require medical attention, but sometimes a bacterial infection in the upper airways can follow a viral infection. This is called a secondary infection. Sinus and middle ear infections are common types of secondary upper respiratory infections. Bacterial pneumonia can also complicate a URI. A URI can worsen asthma and chronic obstructive pulmonary disease (COPD). Sometimes, these complications can require emergency medical care and may be life threatening.  CAUSES Almost all URIs are caused by viruses. A virus is a type of germ and can spread from one person to another.  RISKS FACTORS You may be at risk for a URI if:   You smoke.   You have chronic heart or lung disease.  You have a weakened defense (immune) system.   You are very young or very old.   You have nasal allergies or asthma.  You work in crowded or poorly ventilated areas.  You work in health care facilities or schools. SIGNS AND SYMPTOMS  Symptoms typically develop 2-3 days after you come in contact with a cold virus. Most viral URIs last 7-10 days. However, viral URIs from the influenza virus (flu virus) can last 14-18 days and are typically more severe. Symptoms may include:   Runny or stuffy (congested) nose.   Sneezing.   Cough.   Sore throat.   Headache.   Fatigue.   Fever.   Loss of appetite.    Pain in your forehead, behind your eyes, and over your cheekbones (sinus pain).  Muscle aches.  DIAGNOSIS  Your health care provider may diagnose a URI by:  Physical exam.  Tests to check that your symptoms are not due to another condition such as:  Strep throat.  Sinusitis.  Pneumonia.  Asthma. TREATMENT  A URI goes away on its own with time. It cannot be cured with medicines, but medicines may be prescribed or recommended to relieve symptoms. Medicines may help:  Reduce your fever.  Reduce your cough.  Relieve nasal congestion. HOME CARE INSTRUCTIONS   Take medicines only as directed by your health care provider.   Gargle warm saltwater or take cough drops to comfort your throat as directed by your health care provider.  Use a warm mist humidifier or inhale steam from a shower to increase air moisture. This may make it easier to breathe.  Drink enough fluid to keep your urine clear or pale yellow.   Eat soups and other clear broths and maintain good nutrition.   Rest as needed.   Return to work when your temperature has returned to normal or as your health care provider advises. You may need to stay home longer to avoid infecting others. You can also use a face mask and careful hand washing to prevent spread of the virus.  Increase the usage of your inhaler if you have asthma.   Do not use any tobacco products, including cigarettes, chewing tobacco, or  electronic cigarettes. If you need help quitting, ask your health care provider. PREVENTION  The best way to protect yourself from getting a cold is to practice good hygiene.   Avoid oral or hand contact with people with cold symptoms.   Wash your hands often if contact occurs.  There is no clear evidence that vitamin C, vitamin E, echinacea, or exercise reduces the chance of developing a cold. However, it is always recommended to get plenty of rest, exercise, and practice good nutrition.  SEEK MEDICAL  CARE IF:   You are getting worse rather than better.   Your symptoms are not controlled by medicine.   You have chills.  You have worsening shortness of breath.  You have brown or red mucus.  You have yellow or brown nasal discharge.  You have pain in your face, especially when you bend forward.  You have a fever.  You have swollen neck glands.  You have pain while swallowing.  You have white areas in the back of your throat. SEEK IMMEDIATE MEDICAL CARE IF:   You have severe or persistent:  Headache.  Ear pain.  Sinus pain.  Chest pain.  You have chronic lung disease and any of the following:  Wheezing.  Prolonged cough.  Coughing up blood.  A change in your usual mucus.  You have a stiff neck.  You have changes in your:  Vision.  Hearing.  Thinking.  Mood. MAKE SURE YOU:   Understand these instructions.  Will watch your condition.  Will get help right away if you are not doing well or get worse.   This information is not intended to replace advice given to you by your health care provider. Make sure you discuss any questions you have with your health care provider.   Document Released: 01/02/2001 Document Revised: 11/23/2014 Document Reviewed: 10/14/2013 Elsevier Interactive Patient Education [employer].

## 2015-08-20 NOTE — Progress Notes (Signed)
Subjective:    Patient ID: Ricky Diaz, male    DOB: [birth year], 40 y.o.   MRN: [id number]  08/20/2015  Sinusitis   HPI This 40 y.o. male presents for the following:    1.  Sinus congestion: onset last night.  No fever; +HA. No chills; sweats none.  No ear pain or sore throat.  +rhinorrhea mild; +nasal congestion. Scant cough.  No aches.  No vomiting or diarrhea.  Yesterday, worked but was cold.  Really cold last night.  Works outside.  Took Nyquil with moderate results.  Daughter sick last week with same thing.    2.  HTN: Patient reports good compliance with medication, good tolerance to medication, and good symptom control.  No BP checks at home.  3. Hypercholesterolemia: last FLP 2015.  4.  Anemia:  Chronic; with low WBC count; HIV negative at last visit.  5.  OSA on CPAP: Patient reports good compliance with medication, good tolerance to medication, and good symptom control.      Review of Systems  Constitutional: Negative for fever, chills, diaphoresis, activity change, appetite change and fatigue.  HENT: Positive for congestion, postnasal drip, rhinorrhea and sinus pressure. Negative for ear pain, sneezing, sore throat, trouble swallowing and voice change.   Respiratory: Negative for cough and shortness of breath.   Cardiovascular: Negative for chest pain, palpitations and leg swelling.  Gastrointestinal: Negative for nausea, vomiting, abdominal pain and diarrhea.  Endocrine: Negative for cold intolerance, heat intolerance, polydipsia, polyphagia and polyuria.  Skin: Negative for color change, rash and wound.  Neurological: Positive for headaches. Negative for dizziness, tremors, seizures, syncope, facial asymmetry, speech difficulty, weakness, light-headedness and numbness.  Psychiatric/Behavioral: Negative for sleep disturbance and dysphoric mood. The patient is not nervous/anxious.     Past Medical History  Diagnosis Date  . Hypertension     onset age 63.  .  Sleep difficulties   . Glucose intolerance (impaired glucose tolerance)   . Hyperlipidemia   . OSA on CPAP 07/23/2012   Past Surgical History  Procedure Laterality Date  . No past surgeries     No Known Allergies  Social History   Social History  . Marital Status: Married    Spouse Name: [name]  . Number of Children: 4  . Years of Education: 14   Occupational History  .  [employer]   Social History Main Topics  . Smoking status: Never Smoker   . Smokeless tobacco: Never Used  . Alcohol Use: 0.0 oz/week    0 Standard drinks or equivalent per week     Comment: rare  . Drug Use: No  . Sexual Activity:    Partners: Female   Other Topics Concern  . Not on file   Social History Narrative   Marital status: married x 2007. From [place]; [place] since 2000.      Children: 4 children; no grandchildren.      Lives: with wife ([name]) , children.      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      Tobacco: none      Alcohol: none      Drugs: none      Exercise:  Sporadic.   Patient is right-handed.   Patient has a college education.   Patient drinks very little caffeine.   Family History  Problem Relation Age of Onset  . Hypertension Brother   . Sleep apnea Brother   . Sleep apnea Brother  Objective:    BP 120/80 mmHg  Pulse 91  Temp(Src) 98.3 F (36.8 C) (Oral)  Resp 16  Ht  (1.626 m)  Wt 188 lb 6.4 oz (85.458 kg)  BMI 32.32 kg/m2  SpO2 97% Physical Exam  Constitutional: He is oriented to person, place, and time. He appears well-developed and well-nourished. No distress.  HENT:  Head: Normocephalic and atraumatic.  Right Ear: Tympanic membrane, external ear and ear canal normal.  Left Ear: Tympanic membrane, external ear and ear canal normal.  Nose: Mucosal edema and rhinorrhea present. Right sinus exhibits no maxillary sinus tenderness and no frontal sinus tenderness. Left sinus exhibits no maxillary sinus  tenderness and no frontal sinus tenderness.  Mouth/Throat: Oropharynx is clear and moist.  Eyes: Conjunctivae and EOM are normal. Pupils are equal, round, and reactive to light.  Neck: Normal range of motion. Neck supple. Carotid bruit is not present. No thyromegaly present.  Cardiovascular: Normal rate, regular rhythm, normal heart sounds and intact distal pulses.  Exam reveals no gallop and no friction rub.   No murmur heard. Pulmonary/Chest: Effort normal and breath sounds normal. He has no wheezes. He has no rales.  Abdominal: Soft. Bowel sounds are normal. He exhibits no distension and no mass. There is no tenderness. There is no rebound and no guarding.  Lymphadenopathy:    He has no cervical adenopathy.  Neurological: He is alert and oriented to person, place, and time. No cranial nerve deficit.  Skin: Skin is warm and dry. No rash noted. He is not diaphoretic.  Psychiatric: He has a normal mood and affect. His behavior is normal.  Nursing note and vitals reviewed.  Results for orders placed or performed in visit on 08/20/15  POCT CBC  Result Value Ref Range   WBC 9.1 4.6 - 10.2 K/uL   Lymph, poc 1.8 0.6 - 3.4   POC LYMPH PERCENT 19.7 10 - 50 %L   MID (cbc) 0.3 0 - 0.9   POC MID % 3.7 0 - 12 %M   POC Granulocyte 7.0 (A) 2 - 6.9   Granulocyte percent 76.6 37 - 80 %G   RBC 4.14 (A) 4.69 - 6.13 M/uL   Hemoglobin 12.3 (A) 14.1 - 18.1 g/dL   HCT, POC 38.1 (A) 01.7 - 53.7 %   MCV 89.5 80 - 97 fL   MCH, POC 29.8 27 - 31.2 pg   MCHC 33.3 31.8 - 35.4 g/dL   RDW, POC 51.0 %   Platelet Count, POC 189 142 - 424 K/uL   MPV 7.9 0 - 99.8 fL       Assessment & Plan:   1. Viral URI   2. Essential hypertension   3. Glucose intolerance (impaired glucose tolerance)   4. Hyperlipidemia   5. OSA on CPAP   6. Anemia due to other cause   7. Hypokalemia     1. Viral uri:  New.  Rx for Pseudoephedrine  one tablet qid provided; recommend Afrin nasal spray bid. Call in five days if no  improvement. 2.  HTN: controlled; obtain labs; refills provided; no changes in management. 3.  Hyperlipidemia: uncontrolled; obtain labs; recommend weight loss, exercise, and low-fat and low-cholesterol food choices. 4.  Glucose intolerance: stable; obtain labs. 5.  OSA on CPAP: stable; maintained on CPAP and compliance.  Followed by neurology.   Orders Placed This Encounter  Procedures  . Comprehensive metabolic panel    Order Specific Question:  Has the patient fasted?  Answer:  Yes  . Hemoglobin A1c  . TSH  . Lipid panel    Order Specific Question:  Has the patient fasted?    Answer:  Yes  . Iron  . IBC panel  . Vitamin B12  . Ferritin  . Hemoglobinopathy evaluation  . POCT CBC   Meds ordered this encounter  Medications  . spironolactone (ALDACTONE) 25 MG tablet    Sig: Take 1 tablet (25 mg total) by mouth 2 (two) times daily.    Dispense:  180 tablet    Refill:  1  . potassium chloride SA (KLOR-CON M20) 20 MEQ tablet    Sig: Take 1 tablet (20 mEq total) by mouth 2 (two) times daily.    Dispense:  180 tablet    Refill:  3  . lisinopril (PRINIVIL,ZESTRIL) 20 MG tablet    Sig: Take 1 tablet (20 mg total) by mouth daily.    Dispense:  90 tablet    Refill:  1  . labetalol (NORMODYNE) 200 MG tablet    Sig: Take 1 tablet (200 mg total) by mouth 2 (two) times daily.    Dispense:  180 tablet    Refill:  1  . amLODipine (NORVASC) 10 MG tablet    Sig: Take 1 tablet (10 mg total) by mouth daily.    Dispense:  90 tablet    Refill:  1  . Pseudoephedrine HCl, Deter, 30 MG TABA    Sig: Take 1 tablet by mouth 4 (four) times daily.    Dispense:  28 tablet    Refill:  0    To pharmacist: BP is well controlled.    Return in about 6 months (around 02/17/2016) for recheck blood pressure.    Joelys Staubs Paulita Fujita, M.D. [facility] [address] [place], [place]  [address] [phone] phone [phone] fax

## 2015-08-21 LAB — COMPREHENSIVE METABOLIC PANEL
ALBUMIN: 4.2 g/dL (ref 3.6–5.1)
ALK PHOS: 73 U/L (ref 40–115)
ALT: 39 U/L (ref 9–46)
AST: 21 U/L (ref 10–40)
BILIRUBIN TOTAL: 0.3 mg/dL (ref 0.2–1.2)
BUN: 19 mg/dL (ref 7–25)
CALCIUM: 9.6 mg/dL (ref 8.6–10.3)
CO2: 26 mmol/L (ref 20–31)
CREATININE: 1.34 mg/dL (ref 0.60–1.35)
Chloride: 105 mmol/L (ref 98–110)
Glucose, Bld: 85 mg/dL (ref 65–99)
Potassium: 4.8 mmol/L (ref 3.5–5.3)
SODIUM: 138 mmol/L (ref 135–146)
TOTAL PROTEIN: 7.4 g/dL (ref 6.1–8.1)

## 2015-08-21 LAB — LIPID PANEL
Cholesterol: 133 mg/dL (ref 125–200)
HDL: 48 mg/dL (ref >=40)
LDL CALC: 68 mg/dL (ref <130)
Total CHOL/HDL Ratio: 2.8 Ratio (ref <=5.0)
Triglycerides: 85 mg/dL (ref <150)
VLDL: 17 mg/dL (ref <30)

## 2015-08-21 LAB — FERRITIN: Ferritin: 47 ng/mL (ref 22–322)

## 2015-08-21 LAB — IBC PANEL
%SAT: 6 % — AB (ref 15–60)
TIBC: 377 ug/dL (ref 250–425)
UIBC: 354 ug/dL (ref 125–400)

## 2015-08-21 LAB — VITAMIN B12: VITAMIN B 12: 1027 pg/mL — AB (ref 211–911)

## 2015-08-21 LAB — IRON: IRON: 23 ug/dL — AB (ref 50–180)

## 2015-08-21 LAB — TSH: TSH: 0.795 u[IU]/mL (ref 0.350–4.500)

## 2015-08-22 LAB — HEMOGLOBIN A1C
HEMOGLOBIN A1C: 6.6 % — AB (ref <5.7)
MEAN PLASMA GLUCOSE: 143 mg/dL — AB (ref <117)

## 2015-08-22 LAB — HEMOGLOBINOPATHY EVALUATION
HGB A: 97.2 % (ref 96.8–97.8)
Hemoglobin Other: 0 %
Hgb A2 Quant: 2.8 % (ref 2.2–3.2)
Hgb F Quant: 0 % (ref 0.0–2.0)
Hgb S Quant: 0 %

## 2015-08-24 ENCOUNTER — Other Ambulatory Visit: Payer: Self-pay | Admitting: Emergency Medicine

## 2015-09-13 LAB — IFOBT (OCCULT BLOOD): IMMUNOLOGICAL FECAL OCCULT BLOOD TEST: NEGATIVE

## 2015-09-13 NOTE — Addendum Note (Signed)
Addended by: Chapman Moss C on: 09/13/2015 11:20 AM   Modules accepted: Orders

## 2015-09-18 MED ORDER — FERROUS SULFATE 325 (65 FE) MG PO TABS: 325.0000 mg | ORAL_TABLET | Freq: Every day | ORAL | Status: DC

## 2015-09-18 NOTE — Addendum Note (Signed)
Addended by: Ethelda Chick on: 09/18/2015 01:55 PM   Modules accepted: Orders

## 2015-11-23 ENCOUNTER — Ambulatory Visit (INDEPENDENT_AMBULATORY_CARE_PROVIDER_SITE_OTHER): Payer: Managed Care, Other (non HMO) | Admitting: Adult Health

## 2015-11-23 ENCOUNTER — Encounter: Payer: Self-pay | Admitting: Adult Health

## 2015-11-23 VITALS — BP 115/70 | HR 82 | Resp 16 | Ht 64.0 in | Wt 188.0 lb

## 2015-11-23 DIAGNOSIS — Z9989 Dependence on other enabling machines and devices: Principal | ICD-10-CM

## 2015-11-23 DIAGNOSIS — G4733 Obstructive sleep apnea (adult) (pediatric): Secondary | ICD-10-CM

## 2015-11-23 NOTE — Progress Notes (Signed)
PATIENT: Ricky Diaz DOB: 06-12-1976  REASON FOR VISIT: follow up- OSA on CPAP  HISTORY FROM: patient  HISTORY OF PRESENT ILLNESS: [name] is a 40 year old male with a history of obstructive sleep apnea on CPAP. He returns today for follow-up. The patient's compliance download indicates that he uses machine 30 out of 30 days for compliance of 100%. He uses machine greater than 4 hours 28 out of 30 days for compliance of 97%. On average he uses his machine 7 hours and 27 minutes. His residual AHI is 0.8 on 12 cm of water with EPR 2. The patient does have a significant leak in the 95th percentile at 30.8 L/m. The patient reports that he has not changed his mask or equipment out in several months. He did get a new machine. The patient also states that he will be traveling to [place]. He is questioning if they make a battery-operated CPAP for him to use on the plane. He returns today for an evaluation.  HISTORY 01/26/15: [name] is a 40 year old male with a history of OSA on CPAP referred to this office by Dr Ricky Diaz.. He returns today for follow-up. Patient brought his machine with him today for a compliance download. He uses his machine 30 out of 30 days for compliance of 100%. He uses machine greater than 4 hours --30 out of 30 days for compliance of 100%. On average he uses his machine 8 hours and 19 minutes. His AHI is 0.8 at 12 cm of water. The patient has a leak in the 95th percentile at 37.8L/min. the patient typically goes to bed at 1AM and arises at [facility] . Patient works 2nd shift job. He denies any trouble falling or staying asleep. He rarley has to get up during the night. His Epworth sleepiness score is 1 fatigue severity score is 15. Overall he feels that the CPAP has been beneficial. He returns today for an evaluation.  REVIEW OF SYSTEMS: Out of a complete 14 system review of symptoms, the patient complains only of the following symptoms, and all other reviewed systems  are negative. See HPI  ALLERGIES: No Known Allergies  HOME MEDICATIONS: Outpatient Prescriptions Prior to Visit  Medication Sig Dispense Refill  . amLODipine (NORVASC) 10 MG tablet Take 1 tablet (10 mg total) by mouth daily. 90 tablet 1  . ferrous sulfate 325 (65 FE) MG tablet Take 1 tablet (325 mg total) by mouth daily with breakfast. 90 tablet 1  . labetalol (NORMODYNE) 200 MG tablet Take 1 tablet (200 mg total) by mouth 2 (two) times daily. 180 tablet 1  . lisinopril (PRINIVIL,ZESTRIL) 20 MG tablet Take 1 tablet (20 mg total) by mouth daily. 90 tablet 1  . potassium chloride SA (KLOR-CON M20) 20 MEQ tablet Take 1 tablet (20 mEq total) by mouth 2 (two) times daily. 180 tablet 3  . spironolactone (ALDACTONE) 25 MG tablet Take 1 tablet (25 mg total) by mouth 2 (two) times daily. 180 tablet 1  . omeprazole (PRILOSEC) 40 MG capsule Take 1 capsule (40 mg total) by mouth daily. (Patient not taking: Reported on 08/20/2015) 30 capsule 1  . Pseudoephedrine HCl, Deter, 30 MG TABA Take 1 tablet by mouth 4 (four) times daily. 28 tablet 0   No facility-administered medications prior to visit.    PAST MEDICAL HISTORY: Past Medical History  Diagnosis Date  . Hypertension     onset age 64.  . Sleep difficulties   . Glucose intolerance (impaired  glucose tolerance)   . Hyperlipidemia   . OSA on CPAP 07/23/2012    PAST SURGICAL HISTORY: Past Surgical History  Procedure Laterality Date  . No past surgeries      FAMILY HISTORY: Family History  Problem Relation Age of Onset  . Hypertension Brother   . Sleep apnea Brother   . Sleep apnea Brother     SOCIAL HISTORY: Social History   Social History  . Marital Status: Married    Spouse Name: [name]  . Number of Children: 4  . Years of Education: 14   Occupational History  .  [employer]   Social History Main Topics  . Smoking status: Never Smoker   . Smokeless tobacco: Never Used  . Alcohol Use: 0.0 oz/week    0  Standard drinks or equivalent per week     Comment: rare  . Drug Use: No  . Sexual Activity:    Partners: Female   Other Topics Concern  . Not on file   Social History Narrative   Marital status: married x 2007. From [place]; [place] since 2000.      Children: 4 children; no grandchildren.      Lives: with wife ([name]) , children.      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      Tobacco: none      Alcohol: none      Drugs: none      Exercise:  Sporadic.   Patient is right-handed.   Patient has a college education.   Patient drinks very little caffeine.      PHYSICAL EXAM  Filed Vitals:   11/23/15 0753  BP: 115/70  Pulse: 82  Resp: 16  Height: 5\' 4"  (1.626 m)  Weight: 188 lb (85.276 kg)   Body mass index is 32.25 kg/(m^2).  Generalized: Well developed, in no acute distress  Neck: Mallampati 3 +,circumference 17 inches  Neurological examination  Mentation: Alert oriented to time, place, history taking. Follows all commands speech and language fluent Cranial nerve II-XII: Pupils were equal round reactive to light. Extraocular movements were full, visual field were full on confrontational test. Facial sensation and strength were normal. Uvula tongue midline. Head turning and shoulder shrug  were normal and symmetric. Motor: The motor testing reveals 5 over 5 strength of all 4 extremities. Good symmetric motor tone is noted throughout.  Sensory: Sensory testing is intact to soft touch on all 4 extremities. No evidence of extinction is noted.  Coordination: Cerebellar testing reveals good finger-nose-finger and heel-to-shin bilaterally.  Gait and station: Gait is normal. Tandem gait is normal. Romberg is negative. No drift is seen.  Reflexes: Deep tendon reflexes are symmetric and normal bilaterally.   DIAGNOSTIC DATA (LABS, IMAGING, TESTING) - I reviewed patient records, labs, notes, testing and imaging myself where available.  Lab Results  Component  Value Date   WBC 9.1 08/20/2015   HGB 12.3* 08/20/2015   HCT 37.1* 08/20/2015   MCV 89.5 08/20/2015      Component Value Date/Time   NA 138 08/20/2015 1540   K 4.8 08/20/2015 1540   CL 105 08/20/2015 1540   CO2 26 08/20/2015 1540   GLUCOSE 85 08/20/2015 1540   BUN 19 08/20/2015 1540   CREATININE 1.34 08/20/2015 1540   CALCIUM 9.6 08/20/2015 1540   PROT 7.4 08/20/2015 1540   ALBUMIN 4.2 08/20/2015 1540   AST 21 08/20/2015 1540   ALT 39 08/20/2015 1540   ALKPHOS  73 08/20/2015 1540   BILITOT 0.3 08/20/2015 1540   GFRNONAA 78 03/23/2015 0958   GFRAA >89 03/23/2015 0958   Lab Results  Component Value Date   CHOL 133 08/20/2015   HDL 48 08/20/2015   LDLCALC 68 08/20/2015   TRIG 85 08/20/2015   CHOLHDL 2.8 08/20/2015   Lab Results  Component Value Date   HGBA1C 6.6* 08/20/2015   Lab Results  Component Value Date   VITAMINB12 1027* 08/20/2015   Lab Results  Component Value Date   TSH 0.795 08/20/2015      ASSESSMENT AND PLAN 40 y.o. year old male  has a past medical history of Hypertension; Sleep difficulties; Glucose intolerance (impaired glucose tolerance); Hyperlipidemia; and OSA on CPAP (07/23/2012). here with:  1. Obstructive sleep apnea on CPAP  Overall the patient is doing well. His compliance download is excellent. He does have a significant leak. I have sent a new prescription for a new CPAP supplies. If he continues to feel the mask leaking he should let us know. Also advised that he should check with his airline service to see if they can accommodate his CPAP machine. If they cannot-- he can always consult with his DME company to see if they have a battery-operated CPAP machine. Patient verbalized understanding. He will return in 1 year with Dr. Vickey Hugerohmeier.  I spent 25 minutes with the patient 50% of this time was spent discussing his CPAP usage while traveling.  Butch PennyMegan Avyay Coger, MSN, NP-C 11/23/2015, 8:13 AM [facility] [address], [address] [place], [place] [address] [phone]

## 2015-11-23 NOTE — Progress Notes (Signed)
I agree with the assessment and plan as directed by NP .The patient is known to me .   Toshiye Kever, MD  

## 2015-11-23 NOTE — Patient Instructions (Signed)
Continue using CPAP nightly Prescription for new supplies sent If your symptoms worsen or you develop new symptoms please let us know.

## 2015-11-30 ENCOUNTER — Ambulatory Visit (INDEPENDENT_AMBULATORY_CARE_PROVIDER_SITE_OTHER): Payer: Managed Care, Other (non HMO) | Admitting: Family Medicine

## 2015-11-30 VITALS — BP 106/72 | HR 94 | Temp 99.3°F | Resp 18 | Ht 64.0 in | Wt 189.0 lb

## 2015-11-30 DIAGNOSIS — A084 Viral intestinal infection, unspecified: Secondary | ICD-10-CM

## 2015-11-30 MED ORDER — ONDANSETRON HCL 4 MG PO TABS: 4.0000 mg | ORAL_TABLET | Freq: Three times a day (TID) | ORAL | Status: DC | PRN

## 2015-11-30 NOTE — Progress Notes (Signed)
   HPI  Patient presents today here with vomiting and diarrhea.  Patient states that it started this morning with 2 episodes of vomiting of thick white fluid, no blood or bile.  After that he's developed loose stools 2.  He denies any abdominal pain. He has lost his appetite but has been able to tolerate fluids without vomiting. He has also vomited some after drinking fluids.  He took his blood pressure medications this morning like normal. He states that he has very low energy and feels bad currently.  He has no sick contacts, he has 4 children ages 314, 756, 7710, and 5811. They are not ill.  PMH: Smoking status noted ROS: Per HPI  Objective: BP 106/72 mmHg  Pulse 94  Temp(Src) 99.3 F (37.4 C)  Resp 18  Ht 5\' 4"  (1.626 m)  Wt 189 lb (85.73 kg)  BMI 32.43 kg/m2  SpO2 96% Gen: NAD, alert, cooperative with exam HEENT: NCAT, oromucosa pink and slightly dry, TMs normal, nares clear CV: RRR, good S1/S2, no murmur, brisk cap refill Resp: CTABL, no wheezes, non-labored Abd: SNTND, BS present, no guarding or organomegaly Ext: No edema, warm Neuro: Alert and oriented, No gross deficits  Assessment and plan:  # Viral gastroenteritis Abdominal exam is very reassuring, I believe his blood pressure is low due to mild dehydration and taking blood pressure medicines anyway Stop lisinopril 3 days, given instructions to stop lisinopril for 3 days anytime he has a dehydrating illnesses. Tolerating fluids before leaving our clinic today Return to clinic with any worsening symptoms, failure to  improve, or other concerns zofran given for nausea   Meds ordered this encounter  Medications  . ondansetron (ZOFRAN) 4 MG tablet    Sig: Take 1 tablet (4 mg total) by mouth every 8 (eight) hours as needed for nausea or vomiting.    Dispense:  20 tablet    Refill:  0    Murtis SinkSam Gareth Fitzner, MD [facility] 11/30/2015, 11:42 AM

## 2015-11-30 NOTE — Patient Instructions (Addendum)
  Great to meet you!  Stop lisinopril for 3 days, do this any time you have a dehydrating illness.  Also stop amlodipine for 2-3 days, this is only because your blood pressure is low, in a few days you will need it again.  Take your other meds like usual  Drink plenty of fluids,  gatorade with approximately 1/2 water is very good for staying hydrated  Come back if anything gets worse.    Viral Gastroenteritis Viral gastroenteritis is also called stomach flu. This illness is caused by a certain type of germ (virus). It can cause sudden watery poop (diarrhea) and throwing up (vomiting). This can cause you to lose body fluids (dehydration). This illness usually lasts for 3 to 8 days. It usually goes away on its own. HOME CARE   Drink enough fluids to keep your pee (urine) clear or pale yellow. Drink small amounts of fluids often.  Ask your doctor how to replace body fluid losses (rehydration).  Avoid:  Foods high in sugar.  Alcohol.  Bubbly (carbonated) drinks.  Tobacco.  Juice.  Caffeine drinks.  Very hot or cold fluids.  Fatty, greasy foods.  Eating too much at one time.  Dairy products until 24 to 48 hours after your watery poop stops.  You may eat foods with active cultures (probiotics). They can be found in some yogurts and supplements.  Wash your hands well to avoid spreading the illness.  Only take medicines as told by your doctor. Do not give aspirin to children. Do not take medicines for watery poop (antidiarrheals).  Ask your doctor if you should keep taking your regular medicines.  Keep all doctor visits as told. GET HELP RIGHT AWAY IF:   You cannot keep fluids down.  You do not pee at least once every 6 to 8 hours.  You are short of breath.  You see blood in your poop or throw up. This may look like coffee grounds.  You have belly (abdominal) pain that gets worse or is just in one small spot (localized).  You keep throwing up or having watery  poop.  You have a fever.  The patient is a child younger than 3 months, and he or she has a fever.  The patient is a child older than 3 months, and he or she has a fever and problems that do not go away.  The patient is a child older than 3 months, and he or she has a fever and problems that suddenly get worse.  The patient is a baby, and he or she has no tears when crying. MAKE SURE YOU:   Understand these instructions.  Will watch your condition.  Will get help right away if you are not doing well or get worse.   This information is not intended to replace advice given to you by your health care provider. Make sure you discuss any questions you have with your health care provider.   Document Released: 12/26/2007 Document Revised: 10/01/2011 Document Reviewed: 04/25/2011 Elsevier Interactive Patient Education [employer]2016 Elsevier Inc.

## 2016-01-19 ENCOUNTER — Ambulatory Visit: Payer: Managed Care, Other (non HMO) | Admitting: Adult Health

## 2016-02-13 ENCOUNTER — Other Ambulatory Visit: Payer: Self-pay | Admitting: Family Medicine

## 2016-02-13 DIAGNOSIS — I10 Essential (primary) hypertension: Secondary | ICD-10-CM

## 2016-03-17 ENCOUNTER — Other Ambulatory Visit: Payer: Self-pay | Admitting: Family Medicine

## 2016-04-02 ENCOUNTER — Ambulatory Visit (INDEPENDENT_AMBULATORY_CARE_PROVIDER_SITE_OTHER): Payer: Managed Care, Other (non HMO) | Admitting: Family Medicine

## 2016-04-02 ENCOUNTER — Encounter: Payer: Self-pay | Admitting: Family Medicine

## 2016-04-02 VITALS — BP 120/82 | HR 87 | Temp 97.8°F | Resp 18 | Ht 63.0 in | Wt 182.8 lb

## 2016-04-02 DIAGNOSIS — D649 Anemia, unspecified: Secondary | ICD-10-CM | POA: Diagnosis not present

## 2016-04-02 DIAGNOSIS — E876 Hypokalemia: Secondary | ICD-10-CM | POA: Diagnosis not present

## 2016-04-02 DIAGNOSIS — Z13220 Encounter for screening for lipoid disorders: Secondary | ICD-10-CM | POA: Diagnosis not present

## 2016-04-02 DIAGNOSIS — Z23 Encounter for immunization: Secondary | ICD-10-CM

## 2016-04-02 DIAGNOSIS — I10 Essential (primary) hypertension: Secondary | ICD-10-CM

## 2016-04-02 LAB — COMPLETE METABOLIC PANEL WITH GFR
ALT: 55 U/L — AB (ref 9–46)
AST: 22 U/L (ref 10–40)
Albumin: 3.9 g/dL (ref 3.6–5.1)
Alkaline Phosphatase: 54 U/L (ref 40–115)
BILIRUBIN TOTAL: 0.4 mg/dL (ref 0.2–1.2)
BUN: 14 mg/dL (ref 7–25)
CHLORIDE: 105 mmol/L (ref 98–110)
CO2: 26 mmol/L (ref 20–31)
CREATININE: 1.14 mg/dL (ref 0.60–1.35)
Calcium: 9.6 mg/dL (ref 8.6–10.3)
GFR, Est Non African American: 79 mL/min (ref >=60)
GLUCOSE: 111 mg/dL — AB (ref 65–99)
Potassium: 5.4 mmol/L — ABNORMAL HIGH (ref 3.5–5.3)
SODIUM: 139 mmol/L (ref 135–146)
TOTAL PROTEIN: 7.1 g/dL (ref 6.1–8.1)

## 2016-04-02 LAB — CBC WITH DIFFERENTIAL/PLATELET
Basophils Absolute: 0 cells/uL (ref 0–200)
Basophils Relative: 0 %
Eosinophils Absolute: 45 cells/uL (ref 15–500)
Eosinophils Relative: 1 %
HCT: 39.9 % (ref 38.5–50.0)
Hemoglobin: 13.2 g/dL (ref 13.2–17.1)
Lymphocytes Relative: 27 %
Lymphs Abs: 1215 cells/uL (ref 850–3900)
MCH: 30.2 pg (ref 27.0–33.0)
MCHC: 33.1 g/dL (ref 32.0–36.0)
MCV: 91.3 fL (ref 80.0–100.0)
MPV: 11.8 fL (ref 7.5–12.5)
Monocytes Absolute: 405 cells/uL (ref 200–950)
Monocytes Relative: 9 %
Neutro Abs: 2835 cells/uL (ref 1500–7800)
Neutrophils Relative %: 63 %
Platelets: 203 10*3/uL (ref 140–400)
RBC: 4.37 MIL/uL (ref 4.20–5.80)
RDW: 13.3 % (ref 11.0–15.0)
WBC: 4.5 10*3/uL (ref 3.8–10.8)

## 2016-04-02 LAB — LIPID PANEL
Cholesterol: 164 mg/dL (ref 125–200)
HDL: 49 mg/dL (ref >=40)
LDL CALC: 92 mg/dL (ref <130)
TRIGLYCERIDES: 116 mg/dL (ref <150)
Total CHOL/HDL Ratio: 3.3 Ratio (ref <=5.0)
VLDL: 23 mg/dL (ref <30)

## 2016-04-02 MED ORDER — POTASSIUM CHLORIDE CRYS ER 20 MEQ PO TBCR: 20.0000 meq | EXTENDED_RELEASE_TABLET | Freq: Two times a day (BID) | ORAL | 1 refills | Status: DC

## 2016-04-02 MED ORDER — AMLODIPINE BESYLATE 10 MG PO TABS: 10.0000 mg | ORAL_TABLET | Freq: Every day | ORAL | 1 refills | Status: DC

## 2016-04-02 MED ORDER — LISINOPRIL 20 MG PO TABS: 20.0000 mg | ORAL_TABLET | Freq: Every day | ORAL | 1 refills | Status: DC

## 2016-04-02 MED ORDER — SPIRONOLACTONE 25 MG PO TABS: 25.0000 mg | ORAL_TABLET | Freq: Two times a day (BID) | ORAL | 1 refills | Status: DC

## 2016-04-02 MED ORDER — LABETALOL HCL 200 MG PO TABS: 200.0000 mg | ORAL_TABLET | Freq: Two times a day (BID) | ORAL | 1 refills | Status: DC

## 2016-04-02 NOTE — Progress Notes (Signed)
By signing my name below, I, Ricky Diaz, attest that this documentation has been prepared under the direction and in the presence of Ricky Staggers, MD.  Electronically Signed: [name], Medical Scribe. 04/02/16. 9:37 AM.  Subjective:    Patient ID: Ricky Diaz, male    DOB: 1976-05-15, 40 y.o.   MRN: [id number]  HPI Chief Complaint  Patient presents with  . Follow-up    Pt would like lab work  . Medication Refill    All  . Immunizations    flu shot    HPI Comments: Ricky Diaz is a 40 y.o. male who presents to the [facility] for medication refills. PCP is [name],[name], MD. Last visit with Dr. [name] was for acute symptoms in Jan. Pt is fasting.   OSA: Pt is on CPAP and is followed by Dr. [name]. Last lipid panel in Jan.  HTN: Takes Norvasc, Labetalol, Lisinopril, and Aldactone. Slightly higher than prev reading of 1.16 in Aug 2016.  Pt doesn't check his bp at home. Pt is compliant with his medications. Lab Results  Component Value Date   CREATININE 1.34 08/20/2015    Hypokalemia: Takes potassium 20 MEQ BID. Last level 4.8 in Jan. Pt is compliant with his medications.  Anemia: See prior work up. Recommended iron QD. Pt stopped taking his iron supplements a month ago because he felt dizzy, but he does not feels those symptoms today. Lab Results  Component Value Date   WBC 9.1 08/20/2015   HGB 12.3 (A) 08/20/2015   HCT 37.1 (A) 08/20/2015   MCV 89.5 08/20/2015   Pt denies experiencing any negative side effects while on his medications such as chest pain, trouble breathing, light-headedness, HA, dizziness, abdominal pain, and leg swelling.   Patient Active Problem List   Diagnosis Date Noted  . 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 Sleep difficulties   . Hypokalemia 06/09/2014  . HTN (hypertension) 08/06/2012   Past Medical History:  Diagnosis Date  . Glucose intolerance (impaired glucose tolerance)   .  Hyperlipidemia   . Hypertension    onset age 78.  . OSA on CPAP 07/23/2012  . Sleep difficulties    Past Surgical History:  Procedure Laterality Date  . NO PAST SURGERIES     No Known Allergies  Current Outpatient Prescriptions:  .  amLODipine (NORVASC) 10 MG tablet, Take 1 tablet (10 mg total) by mouth daily., Disp: 90 tablet, Rfl: 1 .  labetalol (NORMODYNE) 200 MG tablet, TAKE 1 TABLET (200 MG TOTAL) BY MOUTH 2 (TWO) TIMES DAILY., Disp: 60 tablet, Rfl: 0 .  lisinopril (PRINIVIL,ZESTRIL) 20 MG tablet, Take 1 tablet (20 mg total) by mouth daily., Disp: 90 tablet, Rfl: 1 .  potassium chloride SA (KLOR-CON M20) 20 MEQ tablet, Take 1 tablet (20 mEq total) by mouth 2 (two) times daily., Disp: 180 tablet, Rfl: 3 .  spironolactone (ALDACTONE) 25 MG tablet, TAKE 1 TABLET (25 MG TOTAL) BY MOUTH 2 (TWO) TIMES DAILY., Disp: 60 tablet, Rfl: 0 .  ferrous sulfate 325 (65 FE) MG tablet, Take 325 mg by mouth daily with breakfast., Disp: , Rfl: 0 Social History   Social History  . Marital status: Married    Spouse name: Ricky Diaz  . Number of children: 4  . Years of education: 14   Occupational History  .  [employer]   Social History Main Topics  . Smoking status: Never Smoker  . Smokeless tobacco: Never Used  . Alcohol  use 0.0 oz/week     Comment: rare  . Drug use: No  . Sexual activity: Yes    Partners: Female   Other Topics Concern  . Not on file   Social History Narrative   Marital status: married x 2007. From [place] since 2000.      Children: 4 children; no grandchildren.      Lives: with wife (Ricky Diaz) , children.      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      Tobacco: none      Alcohol: none      Drugs: none      Exercise:  Sporadic.   Patient is right-handed.   Patient has a college education.   Patient drinks very little caffeine.   [facility] 04/02/2016 11/30/2015 08/20/2015 03/23/2015  Decreased Interest 0 0 0 0    Down, Depressed, Hopeless 0 0 0 0  PHQ - 2 Score 0 0 0 0   Review of Systems  Constitutional: Negative for fatigue and unexpected weight change.  Eyes: Negative for visual disturbance.  Respiratory: Negative for cough, chest tightness and shortness of breath.   Cardiovascular: Negative for chest pain, palpitations and leg swelling.  Gastrointestinal: Negative for abdominal pain and blood in stool.  Neurological: Negative for dizziness, light-headedness and headaches.   Objective:  Physical Exam  Constitutional: He is oriented to person, place, and time. He appears well-developed and well-nourished.  HENT:  Head: Normocephalic and atraumatic.  Eyes: EOM are normal. Pupils are equal, round, and reactive to light.  Neck: No JVD present. Carotid bruit is not present.  Cardiovascular: Normal rate, regular rhythm and normal heart sounds.  Exam reveals no friction rub.   No murmur heard. Pulmonary/Chest: Effort normal and breath sounds normal. No respiratory distress. He has no wheezes. He has no rales.  Abdominal: Soft. There is no tenderness. There is no guarding.  Musculoskeletal: He exhibits no edema (lower extremity).  Neurological: He is alert and oriented to person, place, and time.  Skin: Skin is warm and dry.  Psychiatric: He has a normal mood and affect.  Vitals reviewed.  BP 120/82   Pulse 87   Temp 97.8 F (36.6 C) (Oral)   Resp 18   Ht 5\' 3"  (1.6 m)   Wt 182 lb 12.8 oz (82.9 kg)   SpO2 97%   BMI 32.38 kg/m  Assessment & Plan:   Ricky RaveKomivi E Ghattas is a 40 y.o. male Essential hypertension - Plan: COMPLETE METABOLIC PANEL WITH GFR, spironolactone (ALDACTONE) 25 MG tablet, lisinopril (PRINIVIL,ZESTRIL) 20 MG tablet, labetalol (NORMODYNE) 200 MG tablet, amLODipine (NORVASC) 10 MG tablet  -Stable. No med changes for now. Labs pending  Needs flu shot - Plan: Flu Vaccine QUAD 36+ mos IM  Hypokalemia - Plan: potassium chloride SA (KLOR-CON M20) 20 MEQ tablet   check CMP to  check stability. Continue potassium supplement same dose for now.  Anemia, unspecified anemia type - Plan: CBC with Differential/Platelet  -Check CBC, if low, consider different over-the-counter iron supplement.  Screening for hyperlipidemia - Plan: COMPLETE METABOLIC PANEL WITH GFR, Lipid panel   Meds ordered this encounter  Medications  . ferrous sulfate 325 (65 FE) MG tablet    Sig: Take 325 mg by mouth daily with breakfast.    Refill:  0  . spironolactone (ALDACTONE) 25 MG tablet    Sig: Take 1 tablet (25 mg total) by mouth 2 (two) times daily.  Dispense:  180 tablet    Refill:  1  . potassium chloride SA (KLOR-CON M20) 20 MEQ tablet    Sig: Take 1 tablet (20 mEq total) by mouth 2 (two) times daily.    Dispense:  180 tablet    Refill:  1  . lisinopril (PRINIVIL,ZESTRIL) 20 MG tablet    Sig: Take 1 tablet (20 mg total) by mouth daily.    Dispense:  90 tablet    Refill:  1  . labetalol (NORMODYNE) 200 MG tablet    Sig: Take 1 tablet (200 mg total) by mouth 2 (two) times daily.    Dispense:  180 tablet    Refill:  1  . amLODipine (NORVASC) 10 MG tablet    Sig: Take 1 tablet (10 mg total) by mouth daily.    Dispense:  90 tablet    Refill:  1   Patient Instructions   Follow-up within 6 months for a complete physical with Dr. [name]. We will check blood work and let you know if any medication changes are needed.  If your hemoglobin or blood count is low, recommend trying a different over-the-counter iron supplement as dizziness is not a typical side effect of that medication.  Return to the clinic or go to the nearest emergency room if any of your symptoms worsen or new symptoms occur.     IF you received an x-ray today, you will receive an invoice from [facility]. Please contact [facility] at [phone] with questions or concerns regarding your invoice.   IF you received labwork today, you will receive an invoice from [facility]. Please contact [facility] at [phone] with questions or concerns regarding your invoice.   Our billing staff will not be able to assist you with questions regarding bills from these companies.  You will be contacted with the lab results as soon as they are available. The fastest way to get your results is to activate your My Chart account. Instructions are located on the last page of this paperwork. If you have not heard from [place] regarding the results in 2 weeks, please contact this office.       I personally performed the services described in this documentation, which was scribed in my presence. The recorded information has been reviewed and considered, and addended by me as needed.   Signed,   Ricky Staggers, MD [facility].  04/04/16 4:21 PM

## 2016-04-02 NOTE — Patient Instructions (Addendum)
Follow-up within 6 months for a complete physical with Dr. Katrinka BlazingSmith. We will check blood work and let you know if any medication changes are needed.  If your hemoglobin or blood count is low, recommend trying a different over-the-counter iron supplement as dizziness is not a typical side effect of that medication.  Return to the clinic or go to the nearest emergency room if any of your symptoms worsen or new symptoms occur.     IF you received an x-ray today, you will receive an invoice from [facility]. Please contact [facility] at [phone] with questions or concerns regarding your invoice.   IF you received labwork today, you will receive an invoice from [facility]. Please contact [facility] at [phone] with questions or concerns regarding your invoice.   Our billing staff will not be able to assist you with questions regarding bills from these companies.  You will be contacted with the lab results as soon as they are available. The fastest way to get your results is to activate your My Chart account. Instructions are located on the last page of this paperwork. If you have not heard from us regarding the results in 2 weeks, please contact this office.

## 2016-04-18 ENCOUNTER — Other Ambulatory Visit: Payer: Self-pay | Admitting: Family Medicine

## 2016-04-18 DIAGNOSIS — I10 Essential (primary) hypertension: Secondary | ICD-10-CM

## 2016-06-16 ENCOUNTER — Ambulatory Visit (INDEPENDENT_AMBULATORY_CARE_PROVIDER_SITE_OTHER): Payer: Managed Care, Other (non HMO) | Admitting: Physician Assistant

## 2016-06-16 VITALS — BP 114/72 | HR 94 | Temp 98.9°F | Resp 17 | Ht 63.0 in | Wt 181.0 lb

## 2016-06-16 DIAGNOSIS — E875 Hyperkalemia: Secondary | ICD-10-CM

## 2016-06-16 DIAGNOSIS — B9789 Other viral agents as the cause of diseases classified elsewhere: Secondary | ICD-10-CM

## 2016-06-16 DIAGNOSIS — J069 Acute upper respiratory infection, unspecified: Secondary | ICD-10-CM | POA: Diagnosis not present

## 2016-06-16 LAB — POTASSIUM: Potassium: 4.8 mmol/L (ref 3.5–5.3)

## 2016-06-16 MED ORDER — LORATADINE-PSEUDOEPHEDRINE ER 10-240 MG PO TB24: 1.0000 | ORAL_TABLET | Freq: Every day | ORAL | 0 refills | Status: DC

## 2016-06-16 MED ORDER — OXYMETAZOLINE HCL 0.05 % NA SOLN: NASAL | 0 refills | Status: DC

## 2016-06-16 NOTE — Addendum Note (Signed)
Addended by: Clarene CritchleyKOLLER, Oree Hislop M on: 06/16/2016 11:48 AM   Modules accepted: Orders

## 2016-06-16 NOTE — Progress Notes (Signed)
06/16/2016 11:03 AM   DOB: 05-Jun-1976 / MRN: [id number]  SUBJECTIVE:  Ricky Diaz is a 40 y.o. male presenting for nasal congestion that started two days ago.  Reports mild cough.  Denies sore throat, fever, chills, ear pian, eye symptoms. He has tried Ibuprofen and this was mildly helpful. He is sleeping and eating normally for him. His daughter has the same symptoms and her illness started before Ricky Diaz's.    He reports a history of elevated potassium and he stopped taking his potassium as a result. He would like his K rechecked today.   He has No Known Allergies.   He  has a past medical history of Glucose intolerance (impaired glucose tolerance); Hyperlipidemia; Hypertension; OSA on CPAP (07/23/2012); and Sleep difficulties.    He  reports that he has never smoked. He has never used smokeless tobacco. He reports that he drinks alcohol. He reports that he does not use drugs. He  reports that he currently engages in sexual activity and has had male partners. The patient  has a past surgical history that includes No past surgeries.  His family history includes Hypertension in his brother; Sleep apnea in his brother and brother.  Review of Systems  Constitutional: Negative for chills, diaphoresis, fever and malaise/fatigue.  HENT: Positive for congestion. Negative for sore throat.   Respiratory: Positive for cough. Negative for hemoptysis, shortness of breath and wheezing.   Cardiovascular: Negative for chest pain.  Gastrointestinal: Negative for nausea.  Skin: Negative for rash.  Neurological: Negative for dizziness and weakness.  Endo/Heme/Allergies: Negative for polydipsia.    The problem list and medications were reviewed and updated by myself where necessary and exist elsewhere in the encounter.   OBJECTIVE:  BP 114/72 (BP Location: Right Arm, Patient Position: Sitting, Cuff Size: Large)   Pulse 94   Temp 98.9 F (37.2 C) (Oral)   Resp 17   Ht 5\' 3"  (1.6 m)   Wt 181  lb (82.1 kg)   SpO2 97%   BMI 32.06 kg/m   Physical Exam  Constitutional: He is oriented to person, place, and time. He appears well-developed and well-nourished. No distress.  Cardiovascular: Normal rate and regular rhythm.   Pulmonary/Chest: Effort normal and breath sounds normal.  Musculoskeletal: Normal range of motion.  Neurological: He is alert and oriented to person, place, and time. He displays normal reflexes. No cranial nerve deficit. He exhibits normal muscle tone. Coordination normal.  Skin: Skin is warm and dry. He is not diaphoretic.    Lab Results  Component Value Date   K 5.4 (H) 04/02/2016     No results found for this or any previous visit (from the past 72 hour(s)).  No results found.  ASSESSMENT AND PLAN  Ricky Diaz was seen today for sinus problem.  Diagnoses and all orders for this visit:  Viral URI with cough -     loratadine-pseudoephedrine (CLARITIN-D 24 HOUR) 10-240 MG 24 hr tablet; Take 1 tablet by mouth daily. -     oxymetazoline (AFRIN) 0.05 % nasal spray; Take at night only if you can not breath through your nose. Do not use for more than three days consecutively.  Serum potassium elevated -     Potassium    The patient is advised to call or return to clinic if he does not see an improvement in symptoms, or to seek the care of the closest emergency department if he worsens with the above plan.   Deliah BostonMichael Joany Khatib, MHS, PA-C [facility] 06/16/2016 11:03 AM

## 2016-06-16 NOTE — Patient Instructions (Signed)
     IF you received an x-ray today, you will receive an invoice from [facility]. Please contact [facility] at [phone] with questions or concerns regarding your invoice.   IF you received labwork today, you will receive an invoice from [facility]. Please contact [facility] at [phone] with questions or concerns regarding your invoice.   Our billing staff will not be able to assist you with questions regarding bills from these companies.  You will be contacted with the lab results as soon as they are available. The fastest way to get your results is to activate your My Chart account. Instructions are located on the last page of this paperwork. If you have not heard from us regarding the results in 2 weeks, please contact this office.      

## 2016-10-09 ENCOUNTER — Other Ambulatory Visit: Payer: Self-pay | Admitting: Family Medicine

## 2016-10-09 DIAGNOSIS — I10 Essential (primary) hypertension: Secondary | ICD-10-CM

## 2016-10-10 ENCOUNTER — Other Ambulatory Visit: Payer: Self-pay | Admitting: Family Medicine

## 2016-10-10 DIAGNOSIS — I10 Essential (primary) hypertension: Secondary | ICD-10-CM

## 2016-10-10 NOTE — Telephone Encounter (Signed)
BP Readings from Last 3 Encounters:  06/16/16 114/72  04/02/16 120/82  11/30/15 106/72

## 2016-11-01 ENCOUNTER — Other Ambulatory Visit: Payer: Self-pay | Admitting: Family Medicine

## 2016-11-01 DIAGNOSIS — I10 Essential (primary) hypertension: Secondary | ICD-10-CM

## 2016-11-22 ENCOUNTER — Encounter (INDEPENDENT_AMBULATORY_CARE_PROVIDER_SITE_OTHER): Payer: Self-pay

## 2016-11-22 ENCOUNTER — Encounter: Payer: Self-pay | Admitting: Neurology

## 2016-11-22 ENCOUNTER — Ambulatory Visit (INDEPENDENT_AMBULATORY_CARE_PROVIDER_SITE_OTHER): Payer: 59 | Admitting: Neurology

## 2016-11-22 VITALS — BP 106/65 | HR 77 | Ht 63.0 in | Wt 185.0 lb

## 2016-11-22 DIAGNOSIS — Z9989 Dependence on other enabling machines and devices: Secondary | ICD-10-CM | POA: Diagnosis not present

## 2016-11-22 DIAGNOSIS — G4733 Obstructive sleep apnea (adult) (pediatric): Secondary | ICD-10-CM | POA: Diagnosis not present

## 2016-11-22 NOTE — Progress Notes (Signed)
PATIENT: Ricky Diaz DOB: Jan 30, 1976  REASON FOR VISIT: follow up- OSA on CPAP  HISTORY FROM: patient  HISTORY OF PRESENT ILLNESS:  Interval history from 11/22/2016 Ricky Diaz a 41 year old male from [place], The patient returns today for CPAP compliance visit. He received a new machine in 2016. He travels quite a bit and uses his machine on his journey's. He feels that CPAP is beneficial to him, his compliance report shows excellent compliance at 100% of days 97% for 4 hours or more daily use was an average user time of 7 hours and 20 minutes in a CPAP set at 12 cm water with 2 cm expiratory pressure relief, residual AHI is 0.8. He does have some significant air leaks some nights but this has not affected his AHI. The air leaks were higher in the first half of the past April- just before he changed interface. No other medical problems.     11-23-2015, mm Ricky Diaz is a 41 year old male with a history of obstructive sleep apnea on CPAP. He returns today for follow-up. The patient's compliance download indicates that he uses machine 30 out of 30 days for compliance of 100%. He uses machine greater than 4 hours 28 out of 30 days for compliance of 97%. On average he uses his machine 7 hours and 27 minutes. His residual AHI is 0.8 on 12 cm of water with EPR 2. The patient does have a significant leak in the 95th percentile at 30.8 L/m. The patient reports that he has not changed his mask or equipment out in several months. He did get a new machine. The patient also states that he will be traveling to [place]. He is questioning if they make a battery-operated CPAP for him to use on the plane. He returns today for an evaluation.  HISTORY 01/26/15: Ricky Diaz is a 41 year old male with a history of OSA on CPAP referred to this office by Dr Nilda Simmer.. He returns today for follow-up. Patient brought his machine with him today for a compliance download. He uses his machine 30 out of 30 days for  compliance of 100%. He uses machine greater than 4 hours --30 out of 30 days for compliance of 100%. On average he uses his machine 8 hours and 19 minutes. His AHI is 0.8 at 12 cm of water. The patient has a leak in the 95th percentile at 37.8L/min. the patient typically goes to bed at 1AM and arises at [facility] . Patient works 2nd shift job. He denies any trouble falling or staying asleep. He rarley has to get up during the night. His Epworth sleepiness score is 1 fatigue severity score is 15. Overall he feels that the CPAP has been beneficial. He returns today for an evaluation.  REVIEW OF SYSTEMS: Out of a complete 14 system review of symptoms, the patient complains only of the following symptoms, and all other reviewed systems are negative. See HPI  The patient had no recent hospitalizations surgeries or medical problems. 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  ALLERGIES: No Known Allergies  HOME MEDICATIONS: Outpatient Medications Prior to Visit  Medication Sig Dispense Refill  . amLODipine (NORVASC) 10 MG tablet Take 1 tablet (10 mg total) by mouth daily. 90 tablet 1  . ferrous sulfate 325 (65 FE) MG tablet Take 325 mg by mouth daily with breakfast.  0  .  labetalol (NORMODYNE) 200 MG tablet TAKE 1 TABLET (200 MG TOTAL) BY MOUTH 2 (TWO) TIMES DAILY. 180 tablet 1  . lisinopril (PRINIVIL,ZESTRIL) 20 MG tablet TAKE 1 TABLET EVERY DAY 90 tablet 1  . loratadine-pseudoephedrine (CLARITIN-D 24 HOUR) 10-240 MG 24 hr tablet Take 1 tablet by mouth daily. 14 tablet 0  . oxymetazoline (AFRIN) 0.05 % nasal spray Take at night only if you can not breath through your nose. Do not use for more than three days consecutively. 30 mL 0  . spironolactone (ALDACTONE) 25 MG tablet TAKE 1 TABLET (25 MG TOTAL) BY MOUTH 2 (TWO) TIMES DAILY. 60 tablet 0   No facility-administered medications prior  to visit.     PAST MEDICAL HISTORY: Past Medical History:  Diagnosis Date  . Glucose intolerance (impaired glucose tolerance)   . Hyperlipidemia   . Hypertension    onset age 41.  . OSA on CPAP 07/23/2012  . Sleep difficulties     PAST SURGICAL HISTORY: Past Surgical History:  Procedure Laterality Date  . NO PAST SURGERIES      FAMILY HISTORY: Family History  Problem Relation Age of Onset  . Hypertension Brother   . Sleep apnea Brother   . Sleep apnea Brother     SOCIAL HISTORY: Social History   Social History  . Marital status: Married    Spouse name: Ricky Diaz  . Number of children: 4  . Years of education: 14   Occupational History  .  [employer]   Social History Main Topics  . Smoking status: Never Smoker  . Smokeless tobacco: Never Used  . Alcohol use 0.0 oz/week     Comment: rare  . Drug use: No  . Sexual activity: Yes    Partners: Female   Other Topics Concern  . Not on file   Social History Narrative   Marital status: married x 2007. From [place] since 2000.      Children: 4 children; no grandchildren.      Lives: with wife (Ricky Diaz) , children.      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      Tobacco: none      Alcohol: none      Drugs: none      Exercise:  Sporadic.   Patient is right-handed.   Patient has a college education.   Patient drinks very little caffeine.      PHYSICAL EXAM  Vitals:   11/22/16 0938  BP: 106/65  Pulse: 77  Weight: 185 lb (83.9 kg)  Height: 5\' 3"  (1.6 m)   Body mass index is 32.77 kg/m.  Generalized: Well developed, in no acute distress  Neck: Mallampati 3 +,circumference 17 inches Chronically congested nasal airway but able to use CPAP>  Uses a nasal mask.   Neurological examination  Mentation: Alert oriented to time, place, history taking. Follows all commands speech and language fluent Cranial nerve; normal taste and smell.  Pupils were equal round reactive  to light. Extraocular movements were full, visual field were full on confrontational test. Facial sensation and strength were normal. Uvula tongue midline. Head turning and shoulder shrug  were normal and symmetric. Motor:  5 / 5 strength of all 4 extremities. Good symmetric motor tone is noted throughout.  Sensory:intact soft touch on all 4 extremities.   Coordination: intact  good finger-nose-finger  bilaterally.  Gait and station: Gait is normal.  DIAGNOSTIC DATA (LABS, IMAGING, TESTING) - I reviewed patient records, labs,  notes, testing and imaging myself where available.  Lab Results  Component Value Date   WBC 4.5 04/02/2016   HGB 13.2 04/02/2016   HCT 39.9 04/02/2016   MCV 91.3 04/02/2016   PLT 203 04/02/2016      Component Value Date/Time   NA 139 04/02/2016 0958   K 4.8 06/16/2016 1001   CL 105 04/02/2016 0958   CO2 26 04/02/2016 0958   GLUCOSE 111 (H) 04/02/2016 0958   BUN 14 04/02/2016 0958   CREATININE 1.14 04/02/2016 0958   CALCIUM 9.6 04/02/2016 0958   PROT 7.1 04/02/2016 0958   ALBUMIN 3.9 04/02/2016 0958   AST 22 04/02/2016 0958   ALT 55 (H) 04/02/2016 0958   ALKPHOS 54 04/02/2016 0958   BILITOT 0.4 04/02/2016 0958   GFRNONAA 79 04/02/2016 0958   GFRAA >89 04/02/2016 0958   Lab Results  Component Value Date   CHOL 164 04/02/2016   HDL 49 04/02/2016   LDLCALC 92 04/02/2016   TRIG 116 04/02/2016   CHOLHDL 3.3 04/02/2016   Lab Results  Component Value Date   HGBA1C 6.6 (H) 08/20/2015   Lab Results  Component Value Date   VITAMINB12 1,027 (H) 08/20/2015   Lab Results  Component Value Date   TSH 0.795 08/20/2015      ASSESSMENT AND PLAN 41 y.o. year old male  has a past medical history of Glucose intolerance (impaired glucose tolerance); Hyperlipidemia; Hypertension; OSA on CPAP (07/23/2012); and Sleep difficulties. here with:  1. Obstructive sleep apnea on CPAP. 2.  Obesity with metabolic comorbidity. Weight gain- needs to try low carb. He has  been diagnosed at diabetic.  3. Controlled HTN. CPAP helped this.    Overall the patient is doing well. His compliance download is excellent again and there are no new medical problems. . He does have a significant leak but it doesn't bother him. I have sent a new prescription for a new CPAP supplies. If he continues to feel the mask leaking he should let us know. Also advised that he should check with his airline service to see if they can accommodate his CPAP machine. If they cannot-- he can always consult with his DME company to see if they have a battery-operated CPAP machine.  Patient verbalized understanding. He will return in 1 year with me or NP.   Melvyn Novas, MD   I5/09/2016, 10:02 AM [facility] [address] [place], [place] [address] [phone]

## 2016-11-30 ENCOUNTER — Other Ambulatory Visit: Payer: Self-pay | Admitting: Family Medicine

## 2016-11-30 ENCOUNTER — Telehealth: Payer: Self-pay | Admitting: Family Medicine

## 2016-11-30 DIAGNOSIS — I10 Essential (primary) hypertension: Secondary | ICD-10-CM

## 2016-11-30 NOTE — Telephone Encounter (Signed)
Call --- I ONLY approved a one month supply of spironolactone because patient is due for six month follow-up and also for CPE.  Please schedule CPE with me in upcoming 1 month.

## 2016-11-30 NOTE — Telephone Encounter (Signed)
LMOM TO CALL AND SCHEDULE APPOINTMENT FOR A  CPE WITH DR Katrinka BlazingSMITH AS SOON AS POSSIBLE

## 2016-12-05 ENCOUNTER — Other Ambulatory Visit: Payer: Self-pay | Admitting: Family Medicine

## 2016-12-05 DIAGNOSIS — I10 Essential (primary) hypertension: Secondary | ICD-10-CM

## 2016-12-18 ENCOUNTER — Encounter: Payer: Self-pay | Admitting: Physician Assistant

## 2016-12-18 ENCOUNTER — Ambulatory Visit (INDEPENDENT_AMBULATORY_CARE_PROVIDER_SITE_OTHER): Payer: 59 | Admitting: Physician Assistant

## 2016-12-18 ENCOUNTER — Ambulatory Visit: Payer: Self-pay | Admitting: Physician Assistant

## 2016-12-18 VITALS — BP 107/69 | HR 86 | Temp 99.2°F | Resp 17 | Ht 64.5 in | Wt 185.0 lb

## 2016-12-18 DIAGNOSIS — I10 Essential (primary) hypertension: Secondary | ICD-10-CM

## 2016-12-18 MED ORDER — LISINOPRIL 20 MG PO TABS: 20.0000 mg | ORAL_TABLET | Freq: Every day | ORAL | 1 refills | Status: DC

## 2016-12-18 MED ORDER — SPIRONOLACTONE 25 MG PO TABS: 25.0000 mg | ORAL_TABLET | Freq: Two times a day (BID) | ORAL | 1 refills | Status: DC

## 2016-12-18 MED ORDER — LABETALOL HCL 200 MG PO TABS: 200.0000 mg | ORAL_TABLET | Freq: Two times a day (BID) | ORAL | 1 refills | Status: DC

## 2016-12-18 MED ORDER — AMLODIPINE BESYLATE 10 MG PO TABS: 10.0000 mg | ORAL_TABLET | Freq: Every day | ORAL | 1 refills | Status: DC

## 2016-12-18 NOTE — Patient Instructions (Signed)
     IF you received an x-ray today, you will receive an invoice from [facility]. Please contact [facility] at [phone] with questions or concerns regarding your invoice.   IF you received labwork today, you will receive an invoice from [facility]. Please contact [facility] at [phone] with questions or concerns regarding your invoice.   Our billing staff will not be able to assist you with questions regarding bills from these companies.  You will be contacted with the lab results as soon as they are available. The fastest way to get your results is to activate your My Chart account. Instructions are located on the last page of this paperwork. If you have not heard from us regarding the results in 2 weeks, please contact this office.     

## 2016-12-18 NOTE — Progress Notes (Signed)
PRIMARY CARE AT [facility] [address], [place] [address] [phone]  Date:  12/18/2016   Name:  Ricky Diaz   DOB:  16-Nov-1975   MRN:  [id number]  PCP:  Wardell Honour, MD    History of Present Illness:  Ricky Diaz is a 41 y.o. male patient who presents to PCP with  Chief Complaint  Patient presents with  . Hypertension     Patient is here for BP recheck Notes no cp, palpitations, sob, leg swelling, vision changes, or hematuria Takes medications compliantly.  He states that the spironolactone is working well for him, however he wishes to be removed from some of the other anti-hypertensives.   Does not check blood pressure at home.  He is avoiding salt intake.     Patient Active Problem List   Diagnosis Date Noted  . 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 Sleep difficulties   . Hypokalemia 06/09/2014  . HTN (hypertension) 08/06/2012    Past Medical History:  Diagnosis Date  . Glucose intolerance (impaired glucose tolerance)   . Hyperlipidemia   . Hypertension    onset age 1.  . OSA on CPAP 07/23/2012  . Sleep difficulties     Past Surgical History:  Procedure Laterality Date  . NO PAST SURGERIES      Social History  Substance Use Topics  . Smoking status: Never Smoker  . Smokeless tobacco: Never Used  . Alcohol use 0.0 oz/week     Comment: rare    Family History  Problem Relation Age of Onset  . Hypertension Brother   . Sleep apnea Brother   . Sleep apnea Brother     No Known Allergies  Medication list has been reviewed and updated.  Current Outpatient Prescriptions on File Prior to Visit  Medication Sig Dispense Refill  . amLODipine (NORVASC) 10 MG tablet TAKE 1 TABLET EVERY DAY 30 tablet 0  . ferrous sulfate 325 (65 FE) MG tablet Take 325 mg by mouth daily with breakfast.  0  . labetalol (NORMODYNE) 200 MG tablet TAKE 1 TABLET (200 MG TOTAL) BY MOUTH 2 (TWO) TIMES DAILY. 180 tablet 1  . lisinopril (PRINIVIL,ZESTRIL) 20  MG tablet TAKE 1 TABLET EVERY DAY 90 tablet 1  . loratadine-pseudoephedrine (CLARITIN-D 24 HOUR) 10-240 MG 24 hr tablet Take 1 tablet by mouth daily. 14 tablet 0  . oxymetazoline (AFRIN) 0.05 % nasal spray Take at night only if you can not breath through your nose. Do not use for more than three days consecutively. 30 mL 0  . spironolactone (ALDACTONE) 25 MG tablet TAKE 1 TABLET (25 MG TOTAL) BY MOUTH 2 (TWO) TIMES DAILY. 60 tablet 0   No current facility-administered medications on file prior to visit.     ROS ROS otherwise unremarkable unless listed above.  Physical Examination: BP 107/69   Pulse 86   Temp 99.2 F (37.3 C) (Oral)   Resp 17   Ht 5' 4.5" (1.638 m)   Wt 185 lb (83.9 kg)   SpO2 98%   BMI 31.26 kg/m  Ideal Body Weight: Weight in (lb) to have BMI = 25: 147.6  Physical Exam  Constitutional: He is oriented to person, place, and time. He appears well-developed and well-nourished. No distress.  HENT:  Head: Normocephalic and atraumatic.  Eyes: Conjunctivae and EOM are normal. Pupils are equal, round, and reactive to light.  Cardiovascular: Normal rate, regular rhythm and normal heart sounds.  Exam reveals no gallop and no friction rub.  No murmur heard. Pulses:      Carotid pulses are 1+ on the right side, and 2+ on the left side.      Radial pulses are 2+ on the right side, and 2+ on the left side.       Dorsalis pedis pulses are 2+ on the right side, and 2+ on the left side.  Pulmonary/Chest: Effort normal. No apnea. No respiratory distress. He has no decreased breath sounds. He has no wheezes. He has no rhonchi.  Neurological: He is alert and oriented to person, place, and time.  Skin: Skin is warm and dry. He is not diaphoretic.  Psychiatric: He has a normal mood and affect. His behavior is normal.     Assessment and Plan: Ricky Diaz is a 40 y.o. male who is here today for BP recheck Offered to try removing one of the blood pressure medicines, such as  the amlodipine with 2 week follow up.  He does not want to follow up at this time.  He request to remain on medicine and revisit at later date.  Would like to assess if there is ability to remove some, however, he is DOT driver, and we must insure that he will return for recheck as well.   Essential hypertension - Plan: CMP14+EGFR, labetalol (NORMODYNE) 200 MG tablet, lisinopril (PRINIVIL,ZESTRIL) 20 MG tablet, spironolactone (ALDACTONE) 25 MG tablet, amLODipine (NORVASC) 10 MG tablet  Ivar Drape, PA-C [facility] 5/29/201811:47 AM

## 2016-12-19 LAB — CMP14+EGFR
ALK PHOS: 62 IU/L (ref 39–117)
ALT: 67 IU/L — ABNORMAL HIGH (ref 0–44)
AST: 35 IU/L (ref 0–40)
Albumin/Globulin Ratio: 1.8 (ref 1.2–2.2)
Albumin: 4.5 g/dL (ref 3.5–5.5)
BUN/Creatinine Ratio: 15 (ref 9–20)
BUN: 16 mg/dL (ref 6–24)
Bilirubin Total: 0.2 mg/dL (ref 0.0–1.2)
CO2: 24 mmol/L (ref 18–29)
CREATININE: 1.08 mg/dL (ref 0.76–1.27)
Calcium: 9.9 mg/dL (ref 8.7–10.2)
Chloride: 106 mmol/L (ref 96–106)
GFR calc Af Amer: 97 mL/min/{1.73_m2} (ref >59)
GFR calc non Af Amer: 84 mL/min/{1.73_m2} (ref >59)
GLOBULIN, TOTAL: 2.5 g/dL (ref 1.5–4.5)
Glucose: 157 mg/dL — ABNORMAL HIGH (ref 65–99)
Potassium: 4.4 mmol/L (ref 3.5–5.2)
SODIUM: 140 mmol/L (ref 134–144)
Total Protein: 7 g/dL (ref 6.0–8.5)

## 2017-01-04 ENCOUNTER — Other Ambulatory Visit: Payer: Self-pay | Admitting: Family Medicine

## 2017-01-04 DIAGNOSIS — I10 Essential (primary) hypertension: Secondary | ICD-10-CM

## 2017-01-17 ENCOUNTER — Telehealth: Payer: Self-pay | Admitting: Radiology

## 2017-01-17 NOTE — Telephone Encounter (Signed)
Will plan to see patient back for a recheck of his blood sugar and may also f/u with elevated ALT at that point.

## 2017-01-17 NOTE — Telephone Encounter (Signed)
The patient returned my call, informed of lab results, and advised to return to clinic to recheck his A1C, fasting. He was not fasting to last visit.

## 2017-02-11 ENCOUNTER — Ambulatory Visit (INDEPENDENT_AMBULATORY_CARE_PROVIDER_SITE_OTHER): Payer: Self-pay | Admitting: Family Medicine

## 2017-02-11 DIAGNOSIS — R7302 Impaired glucose tolerance (oral): Secondary | ICD-10-CM

## 2017-02-11 NOTE — Progress Notes (Signed)
Lab visit only. 

## 2017-02-18 LAB — HEMOGLOBIN A1C
Est. average glucose Bld gHb Est-mCnc: 137 mg/dL
Hgb A1c MFr Bld: 6.4 % — ABNORMAL HIGH (ref 4.8–5.6)

## 2017-02-18 LAB — SPECIMEN STATUS REPORT

## 2017-03-15 ENCOUNTER — Encounter: Payer: Self-pay | Admitting: *Deleted

## 2017-03-23 ENCOUNTER — Other Ambulatory Visit: Payer: Self-pay | Admitting: Family Medicine

## 2017-03-23 DIAGNOSIS — E876 Hypokalemia: Secondary | ICD-10-CM

## 2017-03-27 ENCOUNTER — Other Ambulatory Visit: Payer: Self-pay | Admitting: Physician Assistant

## 2017-03-27 DIAGNOSIS — I10 Essential (primary) hypertension: Secondary | ICD-10-CM

## 2017-03-31 ENCOUNTER — Other Ambulatory Visit: Payer: Self-pay | Admitting: Family Medicine

## 2017-03-31 DIAGNOSIS — E876 Hypokalemia: Secondary | ICD-10-CM

## 2017-04-01 NOTE — Telephone Encounter (Signed)
Please advise 

## 2017-04-04 NOTE — Telephone Encounter (Signed)
Klor-Con discontinued in the past.  Refill denied.  Patient advised of denial in office on 04/02/17.

## 2017-06-22 ENCOUNTER — Other Ambulatory Visit: Payer: Self-pay | Admitting: Urgent Care

## 2017-06-22 DIAGNOSIS — I10 Essential (primary) hypertension: Secondary | ICD-10-CM

## 2017-06-24 ENCOUNTER — Other Ambulatory Visit: Payer: Self-pay | Admitting: Physician Assistant

## 2017-06-24 DIAGNOSIS — I10 Essential (primary) hypertension: Secondary | ICD-10-CM

## 2017-06-24 NOTE — Telephone Encounter (Signed)
This is coming up with a very high interaction with  Spironolactone.   Ok to fill?

## 2017-06-25 NOTE — Telephone Encounter (Signed)
Call -- I have refilled Lisinopril for patient for 30 days only as he is due for six month follow-up.  Please schedule OV with me in upcoming 30 days.  He will need to be fasting for labs as well.

## 2017-07-26 ENCOUNTER — Other Ambulatory Visit: Payer: Self-pay | Admitting: Physician Assistant

## 2017-07-26 DIAGNOSIS — I10 Essential (primary) hypertension: Secondary | ICD-10-CM

## 2017-07-26 NOTE — Telephone Encounter (Signed)
Ricky CornfieldStephanie patient is due for appointment but the interaction was very high with another medication, can we refill

## 2017-07-28 NOTE — Telephone Encounter (Signed)
This issue is likely between the lisinopril and the spironolactone. While that is potentially an issue, the patient appears to have been on this combination for some time now without adverse effects.  OK to authorize (I have done so, see below).  Need to clarify with this patient who he considers his PCP. PA English saw him most recently. Dr. Katrinka BlazingSmith is listed as PCP on the chart, but hasn't seen him since 07/2015, and it was 2015 prior to that. He has seen multiple other providers in this office, but not me.  Please advise the patient that he needs to stay well hydrated while taking this medication, especially because he also takes lisinopril and has high blood sugar, and that he needs a visit with the person he plans to stick with as PCP, before this supply runs out.  Meds ordered this encounter  Medications  . lisinopril (PRINIVIL,ZESTRIL) 20 MG tablet    Sig: Take 1 tablet (20 mg total) by mouth daily.    Dispense:  30 tablet    Refill:  0    Office visit needed for refills

## 2017-08-05 ENCOUNTER — Ambulatory Visit: Payer: 59 | Admitting: Physician Assistant

## 2017-08-05 VITALS — BP 130/84 | HR 78 | Temp 98.7°F | Resp 16 | Ht 64.5 in | Wt 183.0 lb

## 2017-08-05 DIAGNOSIS — E876 Hypokalemia: Secondary | ICD-10-CM | POA: Diagnosis not present

## 2017-08-05 DIAGNOSIS — I10 Essential (primary) hypertension: Secondary | ICD-10-CM | POA: Diagnosis not present

## 2017-08-05 MED ORDER — LABETALOL HCL 200 MG PO TABS: 200.0000 mg | ORAL_TABLET | Freq: Two times a day (BID) | ORAL | 1 refills | Status: DC

## 2017-08-05 MED ORDER — SPIRONOLACTONE 25 MG PO TABS: 25.0000 mg | ORAL_TABLET | Freq: Two times a day (BID) | ORAL | 1 refills | Status: DC

## 2017-08-05 MED ORDER — AMLODIPINE BESYLATE 10 MG PO TABS: 10.0000 mg | ORAL_TABLET | Freq: Every day | ORAL | 1 refills | Status: DC

## 2017-08-05 MED ORDER — LISINOPRIL 20 MG PO TABS: 20.0000 mg | ORAL_TABLET | Freq: Every day | ORAL | 1 refills | Status: DC

## 2017-08-05 NOTE — Progress Notes (Deleted)
PRIMARY CARE AT [facility] [address], [place] [address] [phone]  Date:  08/05/2017   Name:  Ricky Diaz   DOB:  11-18-1975   MRN:  [id number]  PCP:  Ethelda Chick, MD    History of Present Illness:  Ricky Diaz is a 42 y.o. male patient who presents to PCP with  Chief Complaint  Patient presents with  . Medication Refill    BP meds., lisinopril, amlodipine     BP: now con   Patient Active Problem List   Diagnosis Date Noted  . 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 Sleep difficulties   . Hypokalemia 06/09/2014  . HTN (hypertension) 08/06/2012    Past Medical History:  Diagnosis Date  . Glucose intolerance (impaired glucose tolerance)   . Hyperlipidemia   . Hypertension    onset age 24.  . OSA on CPAP 07/23/2012  . Sleep difficulties     Past Surgical History:  Procedure Laterality Date  . NO PAST SURGERIES      Social History   Tobacco Use  . Smoking status: Never Smoker  . Smokeless tobacco: Never Used  Substance Use Topics  . Alcohol use: Yes    Alcohol/week: 0.0 oz    Comment: rare  . Drug use: No    Family History  Problem Relation Age of Onset  . Hypertension Brother   . Sleep apnea Brother   . Sleep apnea Brother     No Known Allergies  Medication list has been reviewed and updated.  Current Outpatient Medications on File Prior to Visit  Medication Sig Dispense Refill  . amLODipine (NORVASC) 10 MG tablet TAKE 1 TABLET BY MOUTH EVERY DAY 30 tablet 5  . amLODipine (NORVASC) 10 MG tablet Take 1 tablet (10 mg total) by mouth daily. 90 tablet 1  . labetalol (NORMODYNE) 200 MG tablet Take 1 tablet (200 mg total) by mouth 2 (two) times daily. 180 tablet 1  . lisinopril (PRINIVIL,ZESTRIL) 20 MG tablet TAKE 1 TABLET EVERY DAY 30 tablet 0  . lisinopril (PRINIVIL,ZESTRIL) 20 MG tablet Take 1 tablet (20 mg total) by mouth daily. 30 tablet 0  . ferrous sulfate 325 (65 FE) MG tablet Take 325 mg by mouth daily with  breakfast.  0  . loratadine-pseudoephedrine (CLARITIN-D 24 HOUR) 10-240 MG 24 hr tablet Take 1 tablet by mouth daily. (Patient not taking: Reported on 08/05/2017) 14 tablet 0  . oxymetazoline (AFRIN) 0.05 % nasal spray Take at night only if you can not breath through your nose. Do not use for more than three days consecutively. (Patient not taking: Reported on 08/05/2017) 30 mL 0  . spironolactone (ALDACTONE) 25 MG tablet TAKE 1 TABLET BY MOUTH TWICE A DAY (Patient not taking: Reported on 08/05/2017) 180 tablet 1   No current facility-administered medications on file prior to visit.     ROS ROS otherwise unremarkable unless listed above.  Physical Examination: BP 130/84   Pulse 78   Temp 98.7 F (37.1 C) (Oral)   Resp 16   Ht 5' 4.5" (1.638 m)   Wt 183 lb (83 kg)   SpO2 98%   BMI 30.93 kg/m  Ideal Body Weight: Weight in (lb) to have BMI = 25: 147.6  Physical Exam  Constitutional: He is oriented to person, place, and time. He appears well-developed and well-nourished. No distress.  HENT:  Head: Normocephalic and atraumatic.  Eyes: Conjunctivae and EOM are normal. Pupils are equal, round, and reactive to light.  Cardiovascular: Normal rate, regular rhythm and normal heart sounds. Exam reveals no gallop and no friction rub.  No murmur heard. Pulses:      Radial pulses are 2+ on the right side, and 2+ on the left side.       Dorsalis pedis pulses are 2+ on the right side, and 2+ on the left side.  Pulmonary/Chest: Effort normal. No respiratory distress.  Neurological: He is alert and oriented to person, place, and time.  Skin: Skin is warm and dry. He is not diaphoretic.  Psychiatric: He has a normal mood and affect. His behavior is normal.     Assessment and Plan: Ricky Diaz is a 42 y.o. male who is here today  There are no diagnoses linked to this encounter.  Trena PlattStephanie Keirra Zeimet, PA-C [facility] 08/05/2017 9:09 AM

## 2017-08-05 NOTE — Progress Notes (Signed)
PRIMARY CARE AT [facility] [address] [place] [address] [phone]  Date:  08/05/2017   Name:  Ricky Diaz   DOB:  Dec 09, 1975   MRN:  [id number]  PCP:  Wardell Honour, MD    History of Present Illness:  Ricky Diaz is a 42 y.o. male patient who presents to PCP with  Chief Complaint  Patient presents with  . Medication Refill    BP meds., lisinopril, amlodipine     He is taking lisinopril, amlodipine, spirono He has not taken klor-con for the last 4-6 months.   He has not ran out, and is taking his medications compliantly. No chest pains, sob, or leg swelling, or palpitations.    Patient Active Problem List   Diagnosis Date Noted  . 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 Sleep difficulties   . Hypokalemia 06/09/2014  . HTN (hypertension) 08/06/2012    Past Medical History:  Diagnosis Date  . Glucose intolerance (impaired glucose tolerance)   . Hyperlipidemia   . Hypertension    onset age 23.  . OSA on CPAP 07/23/2012  . Sleep difficulties     Past Surgical History:  Procedure Laterality Date  . NO PAST SURGERIES      Social History   Tobacco Use  . Smoking status: Never Smoker  . Smokeless tobacco: Never Used  Substance Use Topics  . Alcohol use: Yes    Alcohol/week: 0.0 oz    Comment: rare  . Drug use: No    Family History  Problem Relation Age of Onset  . Hypertension Brother   . Sleep apnea Brother   . Sleep apnea Brother     No Known Allergies  Medication list has been reviewed and updated.  Current Outpatient Medications on File Prior to Visit  Medication Sig Dispense Refill  . amLODipine (NORVASC) 10 MG tablet TAKE 1 TABLET BY MOUTH EVERY DAY 30 tablet 5  . amLODipine (NORVASC) 10 MG tablet Take 1 tablet (10 mg total) by mouth daily. 90 tablet 1  . labetalol (NORMODYNE) 200 MG tablet Take 1 tablet (200 mg total) by mouth 2 (two) times daily. 180 tablet 1  . lisinopril (PRINIVIL,ZESTRIL) 20 MG tablet TAKE 1 TABLET  EVERY DAY 30 tablet 0  . lisinopril (PRINIVIL,ZESTRIL) 20 MG tablet Take 1 tablet (20 mg total) by mouth daily. 30 tablet 0  . ferrous sulfate 325 (65 FE) MG tablet Take 325 mg by mouth daily with breakfast.  0  . loratadine-pseudoephedrine (CLARITIN-D 24 HOUR) 10-240 MG 24 hr tablet Take 1 tablet by mouth daily. (Patient not taking: Reported on 08/05/2017) 14 tablet 0  . oxymetazoline (AFRIN) 0.05 % nasal spray Take at night only if you can not breath through your nose. Do not use for more than three days consecutively. (Patient not taking: Reported on 08/05/2017) 30 mL 0  . spironolactone (ALDACTONE) 25 MG tablet TAKE 1 TABLET BY MOUTH TWICE A DAY (Patient not taking: Reported on 08/05/2017) 180 tablet 1   No current facility-administered medications on file prior to visit.     ROS ROS otherwise unremarkable unless listed above.  Physical Examination: BP 130/84   Pulse 78   Temp 98.7 F (37.1 C) (Oral)   Resp 16   Ht 5' 4.5" (1.638 m)   Wt 183 lb (83 kg)   SpO2 98%   BMI 30.93 kg/m  Ideal Body Weight: Weight in (lb) to have BMI = 25: 147.6  Physical Exam  Constitutional: He is  oriented to person, place, and time. He appears well-developed and well-nourished. No distress.  HENT:  Head: Normocephalic and atraumatic.  Eyes: Conjunctivae and EOM are normal. Pupils are equal, round, and reactive to light.  Cardiovascular: Normal rate, regular rhythm and normal heart sounds.  Pulses:      Radial pulses are 2+ on the right side, and 2+ on the left side.       Dorsalis pedis pulses are 2+ on the right side, and 2+ on the left side.  Pulmonary/Chest: Effort normal. No respiratory distress.  Neurological: He is alert and oriented to person, place, and time.  Skin: Skin is warm and dry. He is not diaphoretic.  Psychiatric: He has a normal mood and affect. His behavior is normal.     Assessment and Plan: Ricky Diaz is a 42 y.o. male who is here today for cc of  Chief Complaint   Patient presents with  . Medication Refill    BP meds., lisinopril, amlodipine  stable at this time.  Hypertension, unspecified type - Plan: CMP14+EGFR  Hypokalemia - Plan: CMP14+EGFR  Essential hypertension - Plan: amLODipine (NORVASC) 10 MG tablet, lisinopril (PRINIVIL,ZESTRIL) 20 MG tablet, spironolactone (ALDACTONE) 25 MG tablet, labetalol (NORMODYNE) 200 MG tablet  Ricky Drape, PA-C [facility] 2/5/201911:34 AM

## 2017-08-05 NOTE — Patient Instructions (Signed)
Prediabetes Prediabetes is the condition of having a blood sugar (blood glucose) level that is higher than it should be, but not high enough for you to be diagnosed with type 2 diabetes. Having prediabetes puts you at risk for developing type 2 diabetes (type 2 diabetes mellitus). Prediabetes may be called impaired glucose tolerance or impaired fasting glucose. Prediabetes usually does not cause symptoms. Your health care provider can diagnose this condition with blood tests. You may be tested for prediabetes if you are overweight and if you have at least one other risk factor for prediabetes. Risk factors for prediabetes include:  Having a family member with type 2 diabetes.  Being overweight or obese.  Being older than age 45.  Being of American-Indian, African-American, Hispanic/Latino, or Asian/Pacific Islander descent.  Having an inactive (sedentary) lifestyle.  Having a history of gestational diabetes or polycystic ovarian syndrome (PCOS).  Having low levels of good cholesterol (HDL-C) or high levels of blood fats (triglycerides).  Having high blood pressure.  What is blood glucose and how is blood glucose measured?  Blood glucose refers to the amount of glucose in your bloodstream. Glucose comes from eating foods that contain sugars and starches (carbohydrates) that the body breaks down into glucose. Your blood glucose level may be measured in mg/dL (milligrams per deciliter) or mmol/L (millimoles per liter).Your blood glucose may be checked with one or more of the following blood tests:  A fasting blood glucose (FBG) test. You will not be allowed to eat (you will fast) for at least 8 hours before a blood sample is taken. ? A normal range for FBG is 70-100 mg/dl (3.9-5.6 mmol/L).  An A1c (hemoglobin A1c) blood test. This test provides information about blood glucose control over the previous 2?3months.  An oral glucose tolerance test (OGTT). This test measures your blood  glucose twice: ? After fasting. This is your baseline level. ? Two hours after you drink a beverage that contains glucose.  You may be diagnosed with prediabetes:  If your FBG is 100?125 mg/dL (5.6-6.9 mmol/L).  If your A1c level is 5.7?6.4%.  If your OGGT result is 140?199 mg/dL (7.8-11 mmol/L).  These blood tests may be repeated to confirm your diagnosis. What happens if blood glucose is too high? The pancreas produces a hormone (insulin) that helps move glucose from the bloodstream into cells. When cells in the body do not respond properly to insulin that the body makes (insulin resistance), excess glucose builds up in the blood instead of going into cells. As a result, high blood glucose (hyperglycemia) can develop, which can cause many complications. This is a symptom of prediabetes. What can happen if blood glucose stays higher than normal for a long time? Having high blood glucose for a long time is dangerous. Too much glucose in your blood can damage your nerves and blood vessels. Long-term damage can lead to complications from diabetes, which may include:  Heart disease.  Stroke.  Blindness.  Kidney disease.  Depression.  Poor circulation in the feet and legs, which could lead to surgical removal (amputation) in severe cases.  How can prediabetes be prevented from turning into type 2 diabetes?  To help prevent type 2 diabetes, take the following actions:  Be physically active. ? Do moderate-intensity physical activity for at least 30 minutes on at least 5 days of the week, or as much as told by your health care provider. This could be brisk walking, biking, or water aerobics. ? Ask your health care provider what   activities are safe for you. A mix of physical activities may be best, such as walking, swimming, cycling, and strength training.  Lose weight as told by your health care provider. ? Losing 5-7% of your body weight can reverse insulin resistance. ? Your health  care provider can determine how much weight loss is best for you and can help you lose weight safely.  Follow a healthy meal plan. This includes eating lean proteins, complex carbohydrates, fresh fruits and vegetables, low-fat dairy products, and healthy fats. ? Follow instructions from your health care provider about eating or drinking restrictions. ? Make an appointment to see a diet and nutrition specialist (registered dietitian) to help you create a healthy eating plan that is right for you.  Do not smoke or use any tobacco products, such as cigarettes, chewing tobacco, and e-cigarettes. If you need help quitting, ask your health care provider.  Take over-the-counter and prescription medicines as told by your health care provider. You may be prescribed medicines that help lower the risk of type 2 diabetes.  This information is not intended to replace advice given to you by your health care provider. Make sure you discuss any questions you have with your health care provider. Document Released: 10/31/2015 Document Revised: 12/15/2015 Document Reviewed: 08/30/2015 Elsevier Interactive Patient Education  2018 Elsevier Inc.  

## 2017-08-06 LAB — CMP14+EGFR
ALT: 73 IU/L — ABNORMAL HIGH (ref 0–44)
AST: 30 IU/L (ref 0–40)
Albumin/Globulin Ratio: 1.8 (ref 1.2–2.2)
Albumin: 4.6 g/dL (ref 3.5–5.5)
Alkaline Phosphatase: 61 IU/L (ref 39–117)
BUN/Creatinine Ratio: 14 (ref 9–20)
BUN: 14 mg/dL (ref 6–24)
Bilirubin Total: 0.2 mg/dL (ref 0.0–1.2)
CO2: 25 mmol/L (ref 20–29)
Calcium: 9.6 mg/dL (ref 8.7–10.2)
Chloride: 102 mmol/L (ref 96–106)
Creatinine, Ser: 1.03 mg/dL (ref 0.76–1.27)
GFR calc Af Amer: 102 mL/min/{1.73_m2} (ref >59)
GFR calc non Af Amer: 89 mL/min/{1.73_m2} (ref >59)
Globulin, Total: 2.5 g/dL (ref 1.5–4.5)
Glucose: 104 mg/dL — ABNORMAL HIGH (ref 65–99)
Potassium: 4.3 mmol/L (ref 3.5–5.2)
Sodium: 141 mmol/L (ref 134–144)
Total Protein: 7.1 g/dL (ref 6.0–8.5)

## 2017-08-20 ENCOUNTER — Encounter: Payer: Self-pay | Admitting: Adult Health

## 2017-10-21 ENCOUNTER — Encounter: Payer: Self-pay | Admitting: Physician Assistant

## 2017-11-25 ENCOUNTER — Ambulatory Visit: Payer: 59 | Admitting: Adult Health

## 2017-11-25 ENCOUNTER — Encounter: Payer: Self-pay | Admitting: Adult Health

## 2017-11-25 VITALS — BP 128/80 | HR 75 | Ht 64.5 in | Wt 189.0 lb

## 2017-11-25 DIAGNOSIS — Z9989 Dependence on other enabling machines and devices: Secondary | ICD-10-CM | POA: Diagnosis not present

## 2017-11-25 DIAGNOSIS — G4733 Obstructive sleep apnea (adult) (pediatric): Secondary | ICD-10-CM | POA: Diagnosis not present

## 2017-11-25 NOTE — Progress Notes (Signed)
PATIENT: Ricky Diaz DOB: 1975/12/04  REASON FOR VISIT: follow up HISTORY FROM: patient  HISTORY OF PRESENT ILLNESS: Today 11/25/17 Ricky Diaz is a 42 year old male with a history of obstructive sleep apnea on CPAP.  He returns today for follow-up.  His download indicates that he use his machine nightly for a compliance of 100%.  He uses machine greater than 4 hours every night.  On average he uses his machine 9 hours and 29 minutes.  His residual AHI is 0.4 on 12 cm of water with EPR of 2.  He does have a leak in the 95th percentile at 34.4 L/min.  This download is from January 2019.  We were unable to get a more recent download however the patient states that he has been using the machine consistently.  He returns today for evaluation.  HISTORY 11/22/2016 (copied from Dr. Oliva Bustard note) Ricky Diaz a 42 year old male from [place], The patient returns today for CPAP compliance visit. He received a new machine in 2016. He travels quite a bit and uses his machine on his journey's. He feels that CPAP is beneficial to him, his compliance report shows excellent compliance at 100% of days 97% for 4 hours or more daily use was an average user time of 7 hours and 20 minutes in a CPAP set at 12 cm water with 2 cm expiratory pressure relief, residual AHI is 0.8. He does have some significant air leaks some nights but this has not affected his AHI. The air leaks were higher in the first half of the past April- just before he changed interface. No other medical problems.     REVIEW OF SYSTEMS: Out of a complete 14 system review of symptoms, the patient complains only of the following symptoms, and all other reviewed systems are negative.  See HPI  ALLERGIES: No Known Allergies  HOME MEDICATIONS: Outpatient Medications Prior to Visit  Medication Sig Dispense Refill  . amLODipine (NORVASC) 10 MG tablet Take 1 tablet (10 mg total) by mouth daily. 90 tablet 1  . ferrous sulfate 325 (65 FE)  MG tablet Take 325 mg by mouth daily with breakfast.  0  . labetalol (NORMODYNE) 200 MG tablet Take 1 tablet (200 mg total) by mouth 2 (two) times daily. 180 tablet 1  . lisinopril (PRINIVIL,ZESTRIL) 20 MG tablet Take 1 tablet (20 mg total) by mouth daily. 90 tablet 1  . loratadine-pseudoephedrine (CLARITIN-D 24 HOUR) 10-240 MG 24 hr tablet Take 1 tablet by mouth daily. 14 tablet 0  . oxymetazoline (AFRIN) 0.05 % nasal spray Take at night only if you can not breath through your nose. Do not use for more than three days consecutively. 30 mL 0  . spironolactone (ALDACTONE) 25 MG tablet Take 1 tablet (25 mg total) by mouth 2 (two) times daily. 180 tablet 1  . lisinopril (PRINIVIL,ZESTRIL) 20 MG tablet TAKE 1 TABLET EVERY DAY 30 tablet 0   No facility-administered medications prior to visit.     PAST MEDICAL HISTORY: Past Medical History:  Diagnosis Date  . Glucose intolerance (impaired glucose tolerance)   . Hyperlipidemia   . Hypertension    onset age 3.  . OSA on CPAP 07/23/2012  . Sleep difficulties     PAST SURGICAL HISTORY: Past Surgical History:  Procedure Laterality Date  . NO PAST SURGERIES      FAMILY HISTORY: Family History  Problem Relation Age of Onset  . Hypertension Brother   . Sleep apnea Brother   .  Sleep apnea Brother     SOCIAL HISTORY: Social History   Socioeconomic History  . Marital status: Married    Spouse name: [name]  . Number of children: 4  . Years of education: 63  . Highest education level: Not on file  Occupational History  . Not on file  Social Needs  . Financial resource strain: Not on file  . Food insecurity:    Worry: Not on file    Inability: Not on file  . Transportation needs:    Medical: Not on file    Non-medical: Not on file  Tobacco Use  . Smoking status: Never Smoker  . Smokeless tobacco: Never Used  Substance and Sexual Activity  . Alcohol use: Yes    Alcohol/week: 0.0 oz    Comment: rare  . Drug use: No  .  Sexual activity: Yes    Partners: Female  Lifestyle  . Physical activity:    Days per week: Not on file    Minutes per session: Not on file  . Stress: Not on file  Relationships  . Social connections:    Talks on phone: Not on file    Gets together: Not on file    Attends religious service: Not on file    Active member of club or organization: Not on file    Attends meetings of clubs or organizations: Not on file    Relationship status: Not on file  . Intimate partner violence:    Fear of current or ex partner: Not on file    Emotionally abused: Not on file    Physically abused: Not on file    Forced sexual activity: Not on file  Other Topics Concern  . Not on file  Social History Narrative   Marital status: married x 2007. From [place]; [place] since 2000.      Children: 4 children; no grandchildren.      Lives: with wife ([name]) , children.      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      Tobacco: none      Alcohol: none      Drugs: none      Exercise:  Sporadic.   Patient is right-handed.   Patient has a college education.   Patient drinks very little caffeine.      PHYSICAL EXAM  Vitals:   11/25/17 0853  BP: 128/80  Pulse: 75  Weight: 189 lb (85.7 kg)  Height: 5' 4.5" (1.638 m)   Body mass index is 31.94 kg/m.  Generalized: Well developed, in no acute distress   Neurological examination  Mentation: Alert oriented to time, place, history taking. Follows all commands speech and language fluent Cranial nerve II-XII: Pupils were equal round reactive to light. Extraocular movements were full, visual field were full on confrontational test. Facial sensation and strength were normal. Uvula tongue midline. Head turning and shoulder shrug  were normal and symmetric.  Neck circumference 17.75 inches, Mallampati 4+ motor: The motor testing reveals 5 over 5 strength of all 4 extremities. Good symmetric motor tone is noted throughout.  Sensory: Sensory  testing is intact to soft touch on all 4 extremities. No evidence of extinction is noted.  Coordination: Cerebellar testing reveals good finger-nose-finger and heel-to-shin bilaterally.  Gait and station: Gait is normal. Tandem gait is normal. Romberg is negative. No drift is seen.  Reflexes: Deep tendon reflexes are symmetric and normal bilaterally.   DIAGNOSTIC DATA (LABS, IMAGING, TESTING) - I  reviewed patient records, labs, notes, testing and imaging myself where available.  Lab Results  Component Value Date   WBC 4.5 04/02/2016   HGB 13.2 04/02/2016   HCT 39.9 04/02/2016   MCV 91.3 04/02/2016   PLT 203 04/02/2016      Component Value Date/Time   NA 141 08/05/2017 1141   K 4.3 08/05/2017 1141   CL 102 08/05/2017 1141   CO2 25 08/05/2017 1141   GLUCOSE 104 (H) 08/05/2017 1141   GLUCOSE 111 (H) 04/02/2016 0958   BUN 14 08/05/2017 1141   CREATININE 1.03 08/05/2017 1141   CREATININE 1.14 04/02/2016 0958   CALCIUM 9.6 08/05/2017 1141   PROT 7.1 08/05/2017 1141   ALBUMIN 4.6 08/05/2017 1141   AST 30 08/05/2017 1141   ALT 73 (H) 08/05/2017 1141   ALKPHOS 61 08/05/2017 1141   BILITOT 0.2 08/05/2017 1141   GFRNONAA 89 08/05/2017 1141   GFRNONAA 79 04/02/2016 0958   GFRAA 102 08/05/2017 1141   GFRAA >89 04/02/2016 0958   Lab Results  Component Value Date   CHOL 164 04/02/2016   HDL 49 04/02/2016   LDLCALC 92 04/02/2016   TRIG 116 04/02/2016   CHOLHDL 3.3 04/02/2016   Lab Results  Component Value Date   HGBA1C 6.4 (H) 02/11/2017   Lab Results  Component Value Date   VITAMINB12 1,027 (H) 08/20/2015   Lab Results  Component Value Date   TSH 0.795 08/20/2015      ASSESSMENT AND PLAN 42 y.o. year old male  has a past medical history of Glucose intolerance (impaired glucose tolerance), Hyperlipidemia, Hypertension, OSA on CPAP (07/23/2012), and Sleep difficulties. here with:  1.  Obstructive sleep apnea on CPAP  The patient CPAP download indicates great compliance  and good treatment of his apnea.  We will call his DME company to see if we can get more recent download as well.  The patient is encouraged to continue using his CPAP nightly.  He is advised that if his symptoms worsen or he develops new symptoms he should let us know.  He will follow-up in 1 year or sooner as needed.    I spent 15 minutes with the patient. 50% of this time was spent reviewing CPAP download   Butch Penny, MSN, NP-C 11/25/2017, 9:07 AM [facility] [address], [address] [place], [place] [address] [phone]

## 2017-11-25 NOTE — Patient Instructions (Signed)
Your Plan:  Continue using CPAP nightly If your symptoms worsen or you develop new symptoms please let us know.   Thank you for coming to see us at [facility]. I hope we have been able to provide you high quality care today.  You may receive a patient satisfaction survey over the next few weeks. We would appreciate your feedback and comments so that we may continue to improve ourselves and the health of our patients.  

## 2017-12-04 NOTE — Progress Notes (Signed)
I agree with the assessment and plan as directed by NP .The patient is known to me .   Charlaine Utsey, MD  

## 2017-12-12 ENCOUNTER — Encounter: Payer: Self-pay | Admitting: Family Medicine

## 2018-03-22 ENCOUNTER — Other Ambulatory Visit: Payer: Self-pay | Admitting: *Deleted

## 2018-03-22 DIAGNOSIS — I10 Essential (primary) hypertension: Secondary | ICD-10-CM

## 2018-03-22 MED ORDER — AMLODIPINE BESYLATE 10 MG PO TABS: 10.0000 mg | ORAL_TABLET | Freq: Every day | ORAL | 0 refills | Status: DC

## 2018-03-22 MED ORDER — LISINOPRIL 20 MG PO TABS: 20.0000 mg | ORAL_TABLET | Freq: Every day | ORAL | 0 refills | Status: DC

## 2018-03-25 ENCOUNTER — Other Ambulatory Visit: Payer: Self-pay

## 2018-03-25 ENCOUNTER — Encounter: Payer: Self-pay | Admitting: Physician Assistant

## 2018-03-25 ENCOUNTER — Ambulatory Visit: Payer: 59 | Admitting: Physician Assistant

## 2018-03-25 VITALS — BP 100/60 | HR 94 | Temp 98.6°F | Resp 16 | Ht 63.0 in | Wt 188.6 lb

## 2018-03-25 DIAGNOSIS — I10 Essential (primary) hypertension: Secondary | ICD-10-CM | POA: Diagnosis not present

## 2018-03-25 DIAGNOSIS — Z23 Encounter for immunization: Secondary | ICD-10-CM | POA: Diagnosis not present

## 2018-03-25 DIAGNOSIS — Z87898 Personal history of other specified conditions: Secondary | ICD-10-CM

## 2018-03-25 LAB — POCT URINALYSIS DIP (MANUAL ENTRY)
Bilirubin, UA: NEGATIVE
Blood, UA: NEGATIVE
Glucose, UA: NEGATIVE mg/dL
Ketones, POC UA: NEGATIVE mg/dL
Leukocytes, UA: NEGATIVE
Nitrite, UA: NEGATIVE
Protein Ur, POC: NEGATIVE mg/dL
Spec Grav, UA: 1.025 (ref 1.010–1.025)
Urobilinogen, UA: 0.2 E.U./dL
pH, UA: 5 (ref 5.0–8.0)

## 2018-03-25 LAB — POCT GLYCOSYLATED HEMOGLOBIN (HGB A1C): Hemoglobin A1C: 6.5 % — AB (ref 4.0–5.6)

## 2018-03-25 MED ORDER — SPIRONOLACTONE 25 MG PO TABS: 25.0000 mg | ORAL_TABLET | Freq: Every day | ORAL | 1 refills | Status: DC

## 2018-03-25 MED ORDER — LISINOPRIL 10 MG PO TABS: 10.0000 mg | ORAL_TABLET | Freq: Every day | ORAL | 0 refills | Status: DC

## 2018-03-25 NOTE — Progress Notes (Signed)
Ricky Diaz  MRN: [id number] DOB: 1975/12/23  PCP: Patient, No Pcp Per  Subjective:  Pt is a 42 year old male who presents to clinic for f/u HTN. This is my first time seeing this pt. He is a former pt of [place].  Last OV for this problem 08/05/2017. CMP done at that time, elevated LFT's. Result note from [place] [facility]: What is your etOH intake? We may decide to proceed to ultrasound liver though this could be diuretic. We may recheck this in 1 month. He needs refill of lisinopril 51m.   Blood pressure today is 100/60 Does not check blood pressure at home.  He eats rice, salad, vegetables. Cheeseburgers 3x/month.  Walks, runs, swims at least 1x/week.   Denies lightheadedness, dizziness, chronic headache, double vision, chest pain, shortness of breath, heart racing, palpitations, nausea, vomiting, abdominal pain, hematuria, lower leg swelling.  BP Readings from Last 3 Encounters:  03/25/18 100/60  11/25/17 128/80  08/05/17 130/84    Lab Results  Component Value Date   HGBA1C 6.4 (H) 02/11/2017   Lab Results  Component Value Date   CHOL 164 04/02/2016   CHOL 133 08/20/2015   CHOL 202 (H) 09/01/2013   Lab Results  Component Value Date   HDL 49 04/02/2016   HDL 48 08/20/2015   HDL 47 09/01/2013   Lab Results  Component Value Date   LDLCALC 92 04/02/2016   LDLCALC 68 08/20/2015   LDLCALC 138 (H) 09/01/2013   Lab Results  Component Value Date   TRIG 116 04/02/2016   TRIG 85 08/20/2015   TRIG 86 09/01/2013   Lab Results  Component Value Date   CHOLHDL 3.3 04/02/2016   CHOLHDL 2.8 08/20/2015   CHOLHDL 4.3 09/01/2013   No results found for: LDLDIRECT  Review of Systems  Constitutional: Negative for chills and diaphoresis.  Respiratory: Negative for cough, chest tightness, shortness of breath and wheezing.   Cardiovascular: Negative for chest pain, palpitations and leg swelling.  Gastrointestinal: Negative for diarrhea, nausea and vomiting.    Musculoskeletal: Negative for neck pain.  Neurological: Negative for dizziness, syncope, light-headedness and headaches.  Psychiatric/Behavioral: Negative for sleep disturbance. The patient is not nervous/anxious.     Patient Active Problem List   Diagnosis Date Noted  . Primary [place]osteronism ([place] 06/23/2015  . Sleep difficulties   . Hypokalemia 06/09/2014  . HTN (hypertension) 08/06/2012    Current Outpatient Medications on File Prior to Visit  Medication Sig Dispense Refill  . amLODipine (NORVASC) 10 MG tablet Take 1 tablet (10 mg total) by mouth daily. 15 tablet 0  . labetalol (NORMODYNE) 200 MG tablet Take 1 tablet (200 mg total) by mouth 2 (two) times daily. 180 tablet 1  . lisinopril (PRINIVIL,ZESTRIL) 20 MG tablet Take 1 tablet (20 mg total) by mouth daily. 15 tablet 0  . spironolactone (ALDACTONE) 25 MG tablet Take 1 tablet (25 mg total) by mouth 2 (two) times daily. 180 tablet 1  . ferrous sulfate 325 (65 FE) MG tablet Take 325 mg by mouth daily with breakfast.  0   No current facility-administered medications on file prior to visit.     No Known Allergies   Objective:  BP 100/60 (BP Location: Left Arm, Patient Position: Sitting, Cuff Size: Large)   Pulse 94   Temp 98.6 F (37 C) (Oral)   Resp 16   Ht '5\' 3"'  (1.6 m)   Wt 188 lb 9.6 oz (85.5 kg)   SpO2 95%   BMI  33.41 kg/m   Physical Exam  Constitutional: He is oriented to person, place, and time. He appears well-developed and well-nourished.  Cardiovascular: Normal rate and regular rhythm.  Musculoskeletal:       Right lower leg: He exhibits no edema.       Left lower leg: He exhibits no edema.  Neurological: He is alert and oriented to person, place, and time.  Skin: Skin is warm and dry.  Psychiatric: He has a normal mood and affect. His behavior is normal. Judgment and thought content normal.  Vitals reviewed.  The 10-year ASCVD risk score Mikey Bussing DC Brooke Bonito., et al., 2013) is: 3.9%   Values used to  calculate the score:     Age: 28 years     Sex: Male     Is Non-Hispanic African American: Yes     Diabetic: No     Tobacco smoker: No     Systolic Blood Pressure: 979 mmHg     Is BP treated: Yes     HDL Cholesterol: 49 mg/dL     Total Cholesterol: 164 mg/dL Results for orders placed or performed in visit on 03/25/18  POCT glycosylated hemoglobin (Hb A1C)  Result Value Ref Range   Hemoglobin A1C 6.5 (A) 4.0 - 5.6 %   HbA1c POC (<> result, manual entry)     HbA1c, POC (prediabetic range)     HbA1c, POC (controlled diabetic range)      Assessment and Plan :  1. Essential hypertension - Pt presents for HTN check. He is a new pt to me. Blood pressure today is 100/60. He is asymptomatic. Pt has a few low BP readings in the past. Plan to reduce lisinopril from 38m to 166m RTC in 1 month for recheck.  - POCT urinalysis dipstick - CMP14+EGFR - lisinopril (PRINIVIL,ZESTRIL) 10 MG tablet; Take 1 tablet (10 mg total) by mouth daily.  Dispense: 60 tablet; Refill: 0 - spironolactone (ALDACTONE) 25 MG tablet; Take 1 tablet (25 mg total) by mouth daily.  Dispense: 90 tablet; Refill: 1  2. History of prediabetes - A1C is 6.5% today. This has unchanged over the past few years. Prediabetes discussed. con't healthy habits.  - POCT glycosylated hemoglobin (Hb A1C)  3. Flu vaccine need - Administered by CMA.  - Flu Vaccine QUAD 36+ mos IM   WhMercer PodPA-C  [facility] [place]/09/2017 8:20 AM  Please note: Portions of this report may have been transcribed using dragon voice recognition software. Every effort was made to ensure accuracy; however, inadvertent computerized transcription errors may be present.

## 2018-03-25 NOTE — Patient Instructions (Addendum)
Your blood pressure is very low today.  Start taking Lisinopril 10mg  (instead of 20mg ). Please check your blood pressures at [facility] at least 2x/week - write the numbers down along with the date and time of the reading. (A blood pressure around 120-130/70-80 is great.) Come back and see me in 1-2 months to recheck your blood pressure.   Preventing Type 2 Diabetes Mellitus Type 2 diabetes (type 2 diabetes mellitus) is a long-term (chronic) disease that affects blood sugar (glucose) levels. Normally, a hormone called insulin allows glucose to enter cells in the body. The cells use glucose for energy. In type 2 diabetes, one or both of these problems may be present:  The body does not make enough insulin.  The body does not respond properly to insulin that it makes (insulin resistance).  Insulin resistance or lack of insulin causes excess glucose to build up in the blood instead of going into cells. As a result, high blood glucose (hyperglycemia) develops, which can cause many complications. Being overweight or obese and having an inactive (sedentary) lifestyle can increase your risk for diabetes. Type 2 diabetes can be delayed or prevented by making certain nutrition and lifestyle changes. What nutrition changes can be made?  Eat healthy meals and snacks regularly. Keep a healthy snack with you for when you get hungry between meals, such as fruit or a handful of nuts.  Eat lean meats and proteins that are low in saturated fats, such as chicken, fish, egg whites, and beans. Avoid processed meats.  Eat plenty of fruits and vegetables and plenty of grains that have not been processed (whole grains). It is recommended that you eat: ? 1?2 cups of fruit every day. ? 2?3 cups of vegetables every day. ? 6?8 oz of whole grains every day, such as oats, whole wheat, bulgur, brown rice, quinoa, and millet.  Eat low-fat dairy products, such as milk, yogurt, and cheese.  Eat foods that  contain healthy fats, such as nuts, avocado, olive oil, and canola oil.  Drink water throughout the day. Avoid drinks that contain added sugar, such as soda or sweet tea.  Follow instructions from your health care provider about specific eating or drinking restrictions.  Control how much food you eat at a time (portion size). ? Check food labels to find out the serving sizes of foods. ? Use a kitchen scale to weigh amounts of foods.  Saute or steam food instead of frying it. Cook with water or broth instead of oils or butter.  Limit your intake of: ? Salt (sodium). Have no more than 1 tsp (2,400 mg) of sodium a day. If you have heart disease or high blood pressure, have less than ? tsp (1,500 mg) of sodium a day. ? Saturated fat. This is fat that is solid at room temperature, such as butter or fat on meat. What lifestyle changes can be made?  Activity  Do moderate-intensity physical activity for at least 30 minutes on at least 5 days of the week, or as much as told by your health care provider.  Ask your health care provider what activities are safe for you. A mix of physical activities may be best, such as walking, swimming, cycling, and strength training.  Try to add physical activity into your day. For example: ? Park in spots that are farther away than usual, so that you walk more. For example, park in a far corner of the parking lot when you go to the office or the grocery store. ?  Take a walk during your lunch break. ? Use stairs instead of elevators or escalators. Weight Loss  Lose weight as directed. Your health care provider can determine how much weight loss is best for you and can help you lose weight safely.  If you are overweight or obese, you may be instructed to lose at least 5?7 % of your body weight. Alcohol and Tobacco   Limit alcohol intake to no more than 1 drink a day for nonpregnant women and 2 drinks a day for men. One drink equals 12 oz of beer, 5 oz of  wine, or 1 oz of hard liquor.  Do not use any tobacco products, such as cigarettes, chewing tobacco, and e-cigarettes. If you need help quitting, ask your health care provider. Work With Your Health Care Provider  Have your blood glucose tested regularly, as told by your health care provider.  Discuss your risk factors and how you can reduce your risk for diabetes.  Get screening tests as told by your health care provider. You may have screening tests regularly, especially if you have certain risk factors for type 2 diabetes.  Make an appointment with a diet and nutrition specialist (registered dietitian). A registered dietitian can help you make a healthy eating plan and can help you understand portion sizes and food labels. Why are these changes important?  It is possible to prevent or delay type 2 diabetes and related health problems by making lifestyle and nutrition changes.  It can be difficult to recognize signs of type 2 diabetes. The best way to avoid possible damage to your body is to take actions to prevent the disease before you develop symptoms. What can happen if changes are not made?  Your blood glucose levels may keep increasing. Having high blood glucose for a long time is dangerous. Too much glucose in your blood can damage your blood vessels, heart, kidneys, nerves, and eyes.  You may develop prediabetes or type 2 diabetes. Type 2 diabetes can lead to many chronic health problems and complications, such as: ? Heart disease. ? Stroke. ? Blindness. ? Kidney disease. ? Depression. ? Poor circulation in the feet and legs, which could lead to surgical removal (amputation) in severe cases. Where to find support:  Ask your health care provider to recommend a registered dietitian, diabetes educator, or weight loss program.  Look for local or online weight loss groups.  Join a gym, fitness club, or outdoor activity group, such as a walking club. Where to find more  information: To learn more about diabetes and diabetes prevention, visit:  American Diabetes Association (ADA): www.diabetes.[employer] of Diabetes and Digestive and Kidney Diseases: ToyArticles.ca  To learn more about healthy eating, visit:  The U.S. Department of Agriculture Architect), Choose My Plate: http://yates.biz/  Office of Disease Prevention and Health Promotion (ODPHP), Dietary Guidelines: ListingMagazine.si  Summary  You can reduce your risk for type 2 diabetes by increasing your physical activity, eating healthy foods, and losing weight as directed.  Talk with your health care provider about your risk for type 2 diabetes. Ask about any blood tests or screening tests that you need to have. This information is not intended to replace advice given to you by your health care provider. Make sure you discuss any questions you have with your health care provider. Document Released: 10/31/2015 Document Revised: 12/15/2015 Document Reviewed: 08/30/2015 Elsevier Interactive Patient Education  [employer].

## 2018-03-26 LAB — CMP14+EGFR
ALT: 56 IU/L — ABNORMAL HIGH (ref 0–44)
AST: 26 IU/L (ref 0–40)
Albumin/Globulin Ratio: 1.6 (ref 1.2–2.2)
Albumin: 4.5 g/dL (ref 3.5–5.5)
Alkaline Phosphatase: 59 IU/L (ref 39–117)
BUN/Creatinine Ratio: 13 (ref 9–20)
BUN: 15 mg/dL (ref 6–24)
Bilirubin Total: 0.4 mg/dL (ref 0.0–1.2)
CO2: 20 mmol/L (ref 20–29)
Calcium: 10 mg/dL (ref 8.7–10.2)
Chloride: 105 mmol/L (ref 96–106)
Creatinine, Ser: 1.14 mg/dL (ref 0.76–1.27)
GFR calc Af Amer: 90 mL/min/{1.73_m2} (ref >59)
GFR calc non Af Amer: 78 mL/min/{1.73_m2} (ref >59)
Globulin, Total: 2.8 g/dL (ref 1.5–4.5)
Glucose: 114 mg/dL — ABNORMAL HIGH (ref 65–99)
Potassium: 4.3 mmol/L (ref 3.5–5.2)
Sodium: 140 mmol/L (ref 134–144)
Total Protein: 7.3 g/dL (ref 6.0–8.5)

## 2018-03-27 ENCOUNTER — Encounter: Payer: Self-pay | Admitting: Physician Assistant

## 2018-03-27 NOTE — Progress Notes (Signed)
LFTs are improving. con't to monitor. Recheck in one month

## 2018-05-02 ENCOUNTER — Telehealth: Payer: Self-pay | Admitting: Physician Assistant

## 2018-05-02 NOTE — Telephone Encounter (Signed)
Called pt to reschedule appt for BP .VM not set up unable to leave message.  If pt calls back please reschedule 05/29/2018 ov with Mcvey at his convinience

## 2018-05-03 ENCOUNTER — Other Ambulatory Visit: Payer: Self-pay | Admitting: Physician Assistant

## 2018-05-03 DIAGNOSIS — I10 Essential (primary) hypertension: Secondary | ICD-10-CM

## 2018-05-05 NOTE — Telephone Encounter (Signed)
Can only refill for 30 tabs at a time per insurance. Requested Prescriptions  Pending Prescriptions Disp Refills  . lisinopril (PRINIVIL,ZESTRIL) 10 MG tablet [Pharmacy Med Name: LISINOPRIL 10 MG TABLET] 30 tablet 1    Sig: TAKE 1 TABLET BY MOUTH EVERY DAY**NEEDS OFFICE VISIT FOR MORE REFILLS PER DOCTOR**     Cardiovascular:  ACE Inhibitors Passed - 05/05/2018  2:30 PM      Passed - Cr in normal range and within 180 days    Creat  Date Value Ref Range Status  04/02/2016 1.14 0.60 - 1.35 mg/dL Final   Creatinine, Ser  Date Value Ref Range Status  03/25/2018 1.14 0.76 - 1.27 mg/dL Final         Passed - K in normal range and within 180 days    Potassium  Date Value Ref Range Status  03/25/2018 4.3 3.5 - 5.2 mmol/L Final         Passed - Patient is not pregnant      Passed - Last BP in normal range    BP Readings from Last 1 Encounters:  03/25/18 100/60         Passed - Valid encounter within last 6 months    Recent Outpatient Visits          1 month ago Essential hypertension   Primary Care at [facility], [place], PA-C   9 months ago Hypertension, unspecified type   Primary Care at [place], [place], [place]   1 year ago Glucose intolerance (impaired glucose tolerance)   Primary Care at [facility], Asencion Partridge, MD   1 year ago Essential hypertension   Primary Care at [place], [place], [place]   1 year ago Viral URI with cough   [facility] at [facility], Marolyn Hammock, PA-C      Future Appointments            In 1 month McVey, Madelaine Bhat, PA-C Primary Care at [place], [facility]

## 2018-05-05 NOTE — Telephone Encounter (Signed)
Called pharmacy regarding the refill for lisinopril 10 mg tab. It looks like the patient received 60 but pharmacy states that the insurance would only pay for a 30 day supply. And that refill was actually done on 04/10/18 and not on 03/25/18. Attempted to call patient to see how many pills he has left, no answer and mail box is full. Pt has an upcoming appointment on 06/05/18.

## 2018-05-28 ENCOUNTER — Other Ambulatory Visit: Payer: Self-pay | Admitting: Physician Assistant

## 2018-05-28 DIAGNOSIS — I10 Essential (primary) hypertension: Secondary | ICD-10-CM

## 2018-05-28 NOTE — Telephone Encounter (Signed)
Requested Prescriptions  Pending Prescriptions Disp Refills  . lisinopril (PRINIVIL,ZESTRIL) 10 MG tablet [Pharmacy Med Name: LISINOPRIL 10 MG TABLET] 90 tablet 0    Sig: TAKE 1 TABLET BY MOUTH EVERY DAY**NEEDS OFFICE VISIT FOR MORE REFILLS PER DOCTOR**     Cardiovascular:  ACE Inhibitors Passed - 05/28/2018  2:35 PM      Passed - Cr in normal range and within 180 days    Creat  Date Value Ref Range Status  04/02/2016 1.14 0.60 - 1.35 mg/dL Final   Creatinine, Ser  Date Value Ref Range Status  03/25/2018 1.14 0.76 - 1.27 mg/dL Final         Passed - K in normal range and within 180 days    Potassium  Date Value Ref Range Status  03/25/2018 4.3 3.5 - 5.2 mmol/L Final         Passed - Patient is not pregnant      Passed - Last BP in normal range    BP Readings from Last 1 Encounters:  03/25/18 100/60         Passed - Valid encounter within last 6 months    Recent Outpatient Visits          2 months ago Essential hypertension   Primary Care at [facility], [place], PA-C   9 months ago Hypertension, unspecified type   Primary Care at [place], [place], [place]   1 year ago Glucose intolerance (impaired glucose tolerance)   Primary Care at [facility], Asencion Partridge, MD   1 year ago Essential hypertension   Primary Care at [place], [place], [place]   1 year ago Viral URI with cough   [facility] at [facility], Marolyn Hammock, PA-C      Future Appointments            In 1 week McVey, Madelaine Bhat, PA-C Primary Care at [place], [facility]

## 2018-05-29 ENCOUNTER — Ambulatory Visit: Payer: 59 | Admitting: Physician Assistant

## 2018-06-05 ENCOUNTER — Encounter: Payer: Self-pay | Admitting: Physician Assistant

## 2018-06-05 ENCOUNTER — Ambulatory Visit: Payer: 59 | Admitting: Physician Assistant

## 2018-06-05 DIAGNOSIS — I10 Essential (primary) hypertension: Secondary | ICD-10-CM

## 2018-06-05 MED ORDER — LISINOPRIL 10 MG PO TABS: ORAL_TABLET | ORAL | 1 refills | Status: DC

## 2018-06-05 MED ORDER — LABETALOL HCL 200 MG PO TABS: 200.0000 mg | ORAL_TABLET | Freq: Two times a day (BID) | ORAL | 1 refills | Status: DC

## 2018-06-05 MED ORDER — AMLODIPINE BESYLATE 10 MG PO TABS: 10.0000 mg | ORAL_TABLET | Freq: Every day | ORAL | 3 refills | Status: DC

## 2018-06-05 NOTE — Progress Notes (Signed)
   Ricky Diaz  MRN: [id number] DOB: 06-05-1976  PCP: Sebastian AcheMcVey, Jacqualynn Parco Whitney, PA-C  Subjective:  Pt is a 42 year old male who presents to clinic for f/u blood pressure.   HTN - He was here 9/03 with a bp of 100/60. Decreased lisinopril from 20mg  to 10 mg.   He is taking Norvasc 10mg , lisinopril 10mg  and labetalol 200 mg.  Blood pressure today is 120/84. He is feeling well.  Exercises 2x/week.  Does not check blood pressure at home.  Denies lightheadedness, dizziness, chronic headache, double vision, chest pain, shortness of breath, heart racing, palpitations, nausea, vomiting, abdominal pain, hematuria, lower leg swelling.   Review of Systems  Constitutional: Negative for chills and diaphoresis.  Respiratory: Negative for cough, chest tightness, shortness of breath and wheezing.   Cardiovascular: Negative for chest pain, palpitations and leg swelling.  Gastrointestinal: Negative for diarrhea, nausea and vomiting.  Musculoskeletal: Negative for neck pain.  Neurological: Negative for dizziness, syncope, light-headedness and headaches.  Psychiatric/Behavioral: Negative for sleep disturbance. The patient is not nervous/anxious.     Patient Active Problem List   Diagnosis Date Noted  . 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 Sleep difficulties   . Hypokalemia 06/09/2014  . HTN (hypertension) 08/06/2012    Current Outpatient Medications on File Prior to Visit  Medication Sig Dispense Refill  . amLODipine (NORVASC) 10 MG tablet Take 1 tablet (10 mg total) by mouth daily. 15 tablet 0  . labetalol (NORMODYNE) 200 MG tablet Take 1 tablet (200 mg total) by mouth 2 (two) times daily. 180 tablet 1  . lisinopril (PRINIVIL,ZESTRIL) 10 MG tablet TAKE 1 TABLET BY MOUTH EVERY DAY**NEEDS OFFICE VISIT FOR MORE REFILLS PER DOCTOR** 90 tablet 0  . spironolactone (ALDACTONE) 25 MG tablet Take 1 tablet (25 mg total) by mouth daily. 90 tablet 1  . ferrous sulfate 325 (65 FE) MG tablet  Take 325 mg by mouth daily with breakfast.  0   No current facility-administered medications on file prior to visit.     No Known Allergies   Objective:  BP 120/84 (BP Location: Left Arm, Patient Position: Sitting, Cuff Size: Large)   Pulse 87   Temp 99 F (37.2 C) (Oral)   Resp 16   Ht 5\' 3"  (1.6 m)   Wt 188 lb 12.8 oz (85.6 kg)   SpO2 96%   BMI 33.44 kg/m   Physical Exam  Constitutional: He appears well-developed and well-nourished.  Cardiovascular: Normal rate and normal heart sounds.  Skin: Skin is warm and dry.  Vitals reviewed.   Assessment and Plan :  1. Essential hypertension - Pt presents for blood pressure check. Blood pressure today is 120/84. He is feeling well. Con't Norvasc 10mg , lisinopril 10mg  and labetalol 200 mg. RTC in 6 months for annual exam.   - amLODipine (NORVASC) 10 MG tablet; Take 1 tablet (10 mg total) by mouth daily.  Dispense: 90 tablet; Refill: 3 - labetalol (NORMODYNE) 200 MG tablet; Take 1 tablet (200 mg total) by mouth 2 (two) times daily.  Dispense: 180 tablet; Refill: 1 - lisinopril (PRINIVIL,ZESTRIL) 10 MG tablet; TAKE 1 TABLET BY MOUTH EVERY DAY  Dispense: 90 tablet; Refill: 1  Whitney Zalia Hautala, PA-C  [facility] 06/05/2018 8:35 AM  Please note: Portions of this report may have been transcribed using dragon voice recognition software. Every effort was made to ensure accuracy; however, inadvertent computerized transcription errors may be present.

## 2018-06-05 NOTE — Patient Instructions (Addendum)
Come back in 6 months for annual exam. We will recheck your blood pressure and blood sugar. You have had a few elevated blood sugar readings, so we need to keep an eye on this.    DASH Eating Plan DASH stands for "Dietary Approaches to Stop Hypertension." The DASH eating plan is a healthy eating plan that has been shown to reduce high blood pressure (hypertension). It may also reduce your risk for type 2 diabetes, heart disease, and stroke. The DASH eating plan may also help with weight loss. What are tips for following this plan? General guidelines  Avoid eating more than 2,300 mg (milligrams) of salt (sodium) a day. If you have hypertension, you may need to reduce your sodium intake to 1,500 mg a day.  Limit alcohol intake to no more than 1 drink a day for nonpregnant women and 2 drinks a day for men. One drink equals 12 oz of beer, 5 oz of wine, or 1 oz of hard liquor.  Work with your health care provider to maintain a healthy body weight or to lose weight. Ask what an ideal weight is for you.  Get at least 30 minutes of exercise that causes your heart to beat faster (aerobic exercise) most days of the week. Activities may include walking, swimming, or biking.  Work with your health care provider or diet and nutrition specialist (dietitian) to adjust your eating plan to your individual calorie needs. Reading food labels  Check food labels for the amount of sodium per serving. Choose foods with less than 5 percent of the Daily Value of sodium. Generally, foods with less than 300 mg of sodium per serving fit into this eating plan.  To find whole grains, look for the word "whole" as the first word in the ingredient list. Shopping  Buy products labeled as "low-sodium" or "no salt added."  Buy fresh foods. Avoid canned foods and premade or frozen meals. Cooking  Avoid adding salt when cooking. Use salt-free seasonings or herbs instead of table salt or sea salt. Check with your health care  provider or pharmacist before using salt substitutes.  Do not fry foods. Cook foods using healthy methods such as baking, boiling, grilling, and broiling instead.  Cook with heart-healthy oils, such as olive, canola, soybean, or sunflower oil. Meal planning   Eat a balanced diet that includes: ? 5 or more servings of fruits and vegetables each day. At each meal, try to fill half of your plate with fruits and vegetables. ? Up to 6-8 servings of whole grains each day. ? Less than 6 oz of lean meat, poultry, or fish each day. A 3-oz serving of meat is about the same size as a deck of cards. One egg equals 1 oz. ? 2 servings of low-fat dairy each day. ? A serving of nuts, seeds, or beans 5 times each week. ? Heart-healthy fats. Healthy fats called Omega-3 fatty acids are found in foods such as flaxseeds and coldwater fish, like sardines, salmon, and mackerel.  Limit how much you eat of the following: ? Canned or prepackaged foods. ? Food that is high in trans fat, such as fried foods. ? Food that is high in saturated fat, such as fatty meat. ? Sweets, desserts, sugary drinks, and other foods with added sugar. ? Full-fat dairy products.  Do not salt foods before eating.  Try to eat at least 2 vegetarian meals each week.  Eat more home-cooked food and less restaurant, buffet, and fast food.  When eating at a restaurant, ask that your food be prepared with less salt or no salt, if possible. What foods are recommended? The items listed may not be a complete list. Talk with your dietitian about what dietary choices are best for you. Grains Whole-grain or whole-wheat bread. Whole-grain or whole-wheat pasta. Brown rice. [place]. Bulgur. Whole-grain and low-sodium cereals. Pita bread. Low-fat, low-sodium crackers. Whole-wheat flour tortillas. Vegetables Fresh or frozen vegetables (raw, steamed, roasted, or grilled). Low-sodium or reduced-sodium tomato and vegetable juice. Low-sodium or  reduced-sodium tomato sauce and tomato paste. Low-sodium or reduced-sodium canned vegetables. Fruits All fresh, dried, or frozen fruit. Canned fruit in natural juice (without added sugar). Meat and other protein foods Skinless chicken or [place]. Ground chicken or [place]. Pork with fat trimmed off. Fish and seafood. Egg whites. Dried beans, peas, or lentils. Unsalted nuts, nut butters, and seeds. Unsalted canned beans. Lean cuts of beef with fat trimmed off. Low-sodium, lean deli meat. Dairy Low-fat (1%) or fat-free (skim) milk. Fat-free, low-fat, or reduced-fat cheeses. Nonfat, low-sodium ricotta or cottage cheese. Low-fat or nonfat yogurt. Low-fat, low-sodium cheese. Fats and oils Soft margarine without trans fats. Vegetable oil. Low-fat, reduced-fat, or light mayonnaise and salad dressings (reduced-sodium). Canola, safflower, olive, soybean, and sunflower oils. Avocado. Seasoning and other foods Herbs. Spices. Seasoning mixes without salt. Unsalted popcorn and pretzels. Fat-free sweets. What foods are not recommended? The items listed may not be a complete list. Talk with your dietitian about what dietary choices are best for you. Grains Baked goods made with fat, such as croissants, muffins, or some breads. Dry pasta or rice meal packs. Vegetables Creamed or fried vegetables. Vegetables in a cheese sauce. Regular canned vegetables (not low-sodium or reduced-sodium). Regular canned tomato sauce and paste (not low-sodium or reduced-sodium). Regular tomato and vegetable juice (not low-sodium or reduced-sodium). Angie Fava. Olives. Fruits Canned fruit in a light or heavy syrup. Fried fruit. Fruit in cream or butter sauce. Meat and other protein foods Fatty cuts of meat. Ribs. Fried meat. Berniece Salines. Sausage. Bologna and other processed lunch meats. Salami. Fatback. Hotdogs. Bratwurst. Salted nuts and seeds. Canned beans with added salt. Canned or smoked fish. Whole eggs or egg yolks. Chicken or [place]  with skin. Dairy Whole or 2% milk, cream, and half-and-half. Whole or full-fat cream cheese. Whole-fat or sweetened yogurt. Full-fat cheese. Nondairy creamers. Whipped toppings. Processed cheese and cheese spreads. Fats and oils Butter. Stick margarine. Lard. Shortening. Ghee. Bacon fat. Tropical oils, such as coconut, palm kernel, or palm oil. Seasoning and other foods Salted popcorn and pretzels. Onion salt, garlic salt, seasoned salt, table salt, and sea salt. Worcestershire sauce. Tartar sauce. Barbecue sauce. Teriyaki sauce. Soy sauce, including reduced-sodium. Steak sauce. Canned and packaged gravies. Fish sauce. Oyster sauce. Cocktail sauce. Horseradish that you find on the shelf. Ketchup. Mustard. Meat flavorings and tenderizers. Bouillon cubes. Hot sauce and Tabasco sauce. Premade or packaged marinades. Premade or packaged taco seasonings. Relishes. Regular salad dressings. Where to find more information:  National Heart, Lung, and [place]: https://wilson-eaton.com/  American Heart Association: www.heart.org Summary  The DASH eating plan is a healthy eating plan that has been shown to reduce high blood pressure (hypertension). It may also reduce your risk for type 2 diabetes, heart disease, and stroke.  With the DASH eating plan, you should limit salt (sodium) intake to 2,300 mg a day. If you have hypertension, you may need to reduce your sodium intake to 1,500 mg a day.  When on the DASH eating plan,  aim to eat more fresh fruits and vegetables, whole grains, lean proteins, low-fat dairy, and heart-healthy fats.  Work with your health care provider or diet and nutrition specialist (dietitian) to adjust your eating plan to your individual calorie needs. This information is not intended to replace advice given to you by your health care provider. Make sure you discuss any questions you have with your health care provider. Document Released: 06/28/2011 Document Revised: 07/02/2016  Document Reviewed: 07/02/2016 Elsevier Interactive Patient Education  [employer].  Thank you for coming in today. I hope you feel we met your needs.  Feel free to call PCP if you have any questions or further requests.  Please consider signing up for MyChart if you do not already have it, as this is a great way to communicate with me.  Best,  [employer], PA-C

## 2018-09-09 ENCOUNTER — Other Ambulatory Visit: Payer: Self-pay

## 2018-09-09 ENCOUNTER — Ambulatory Visit (INDEPENDENT_AMBULATORY_CARE_PROVIDER_SITE_OTHER): Payer: 59

## 2018-09-09 ENCOUNTER — Ambulatory Visit: Payer: 59 | Admitting: Family Medicine

## 2018-09-09 ENCOUNTER — Encounter: Payer: Self-pay | Admitting: Family Medicine

## 2018-09-09 VITALS — BP 132/84 | HR 74 | Temp 98.8°F | Ht 63.0 in | Wt 185.0 lb

## 2018-09-09 DIAGNOSIS — J22 Unspecified acute lower respiratory infection: Secondary | ICD-10-CM | POA: Diagnosis not present

## 2018-09-09 DIAGNOSIS — R05 Cough: Secondary | ICD-10-CM | POA: Diagnosis not present

## 2018-09-09 DIAGNOSIS — R059 Cough, unspecified: Secondary | ICD-10-CM

## 2018-09-09 DIAGNOSIS — R062 Wheezing: Secondary | ICD-10-CM | POA: Diagnosis not present

## 2018-09-09 MED ORDER — ALBUTEROL SULFATE HFA 108 (90 BASE) MCG/ACT IN AERS: 1.0000 | INHALATION_SPRAY | RESPIRATORY_TRACT | 0 refills | Status: DC | PRN

## 2018-09-09 MED ORDER — AZITHROMYCIN 250 MG PO TABS: ORAL_TABLET | ORAL | 0 refills | Status: DC

## 2018-09-09 NOTE — Progress Notes (Signed)
Subjective:    Patient ID: Ricky Diaz, male    DOB: 08-28-1975, 43 y.o.   MRN: [id number]  HPI Ricky Diaz is a 43 y.o. male Presents today for: Chief Complaint  Patient presents with  . Cough    4 days   . Wheezing    off and on    Cough with wheeze: Cough past 4 days. Has noticed wheeze a few times per day. Occasionally productive of clear phlegm. No fever. No recent foreign travel. No hx of ashtma. Feels like getting better. Initially runny nose/congestion - improved.  Sick contact: dtr - last week.   Tx: theraflu cold and sinus - some improvement.    Patient Active Problem List   Diagnosis Date Noted  . 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 Sleep difficulties   . Hypokalemia 06/09/2014  . HTN (hypertension) 08/06/2012   Past Medical History:  Diagnosis Date  . Glucose intolerance (impaired glucose tolerance)   . Hyperlipidemia   . Hypertension    onset age 43.  . OSA on CPAP 07/23/2012  . Sleep difficulties    Past Surgical History:  Procedure Laterality Date  . NO PAST SURGERIES     No Known Allergies Prior to Admission medications   Medication Sig Start Date End Date Taking? Authorizing Provider  amLODipine (NORVASC) 10 MG tablet Take 1 tablet (10 mg total) by mouth daily. 06/05/18  Yes McVey, Madelaine BhatElizabeth Whitney, PA-C  labetalol (NORMODYNE) 200 MG tablet Take 1 tablet (200 mg total) by mouth 2 (two) times daily. 06/05/18  Yes McVey, Madelaine BhatElizabeth Whitney, PA-C  lisinopril (PRINIVIL,ZESTRIL) 10 MG tablet TAKE 1 TABLET BY MOUTH EVERY DAY 06/05/18  Yes McVey, Madelaine BhatElizabeth Whitney, PA-C  spironolactone (ALDACTONE) 25 MG tablet Take 1 tablet (25 mg total) by mouth daily. 03/25/18  Yes McVey, Madelaine BhatElizabeth Whitney, PA-C  ferrous sulfate 325 (65 FE) MG tablet Take 325 mg by mouth daily with breakfast. 03/17/16   [provider]   Social History   Socioeconomic History  . Marital status: Married    Spouse name: [name]  . Number of children: 4   . Years of education: 4114  . Highest education level: Not on file  Occupational History  . Not on file  Social Needs  . Financial resource strain: Not on file  . Food insecurity:    Worry: Not on file    Inability: Not on file  . Transportation needs:    Medical: Not on file    Non-medical: Not on file  Tobacco Use  . Smoking status: Never Smoker  . Smokeless tobacco: Never Used  Substance and Sexual Activity  . Alcohol use: Yes    Alcohol/week: 0.0 standard drinks    Comment: rare  . Drug use: No  . Sexual activity: Yes    Partners: Female  Lifestyle  . Physical activity:    Days per week: Not on file    Minutes per session: Not on file  . Stress: Not on file  Relationships  . Social connections:    Talks on phone: Not on file    Gets together: Not on file    Attends religious service: Not on file    Active member of club or organization: Not on file    Attends meetings of clubs or organizations: Not on file    Relationship status: Not on file  . Intimate partner violence:    Fear of current or ex partner: Not on file    Emotionally  abused: Not on file    Physically abused: Not on file    Forced sexual activity: Not on file  Other Topics Concern  . Not on file  Social History Narrative   Marital status: married x 2007. From [place]; [place] since 2000.      Children: 4 children; no grandchildren.      Lives: with wife ([name]) , children.      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      Tobacco: none      Alcohol: none      Drugs: none      Exercise:  Sporadic.   Patient is right-handed.   Patient has a college education.   Patient drinks very little caffeine.    Review of Systems Per HPI.     Objective:   Physical Exam Vitals signs reviewed.  Constitutional:      Appearance: He is well-developed.  HENT:     Head: Normocephalic and atraumatic.     Right Ear: Tympanic membrane, ear canal and external ear normal.     Left Ear:  Tympanic membrane, ear canal and external ear normal.     Nose: No rhinorrhea.     Mouth/Throat:     Pharynx: No oropharyngeal exudate or posterior oropharyngeal erythema.  Eyes:     Conjunctiva/sclera: Conjunctivae normal.     Pupils: Pupils are equal, round, and reactive to light.  Neck:     Musculoskeletal: Neck supple.  Cardiovascular:     Rate and Rhythm: Normal rate and regular rhythm.     Heart sounds: Normal heart sounds. No murmur.  Pulmonary:     Effort: Pulmonary effort is normal.     Breath sounds: Wheezing (Few coarse breath sounds recurring left, mostly cleared with cough, possible faint expiratory wheeze overall normal respiratory effort, no distress.) present. No rhonchi or rales.  Abdominal:     Palpations: Abdomen is soft.     Tenderness: There is no abdominal tenderness.  Lymphadenopathy:     Cervical: No cervical adenopathy.  Skin:    General: Skin is warm and dry.     Findings: No rash.  Neurological:     Mental Status: He is alert and oriented to person, place, and time.  Psychiatric:        Behavior: Behavior normal.    Vitals:   09/09/18 1111  BP: 132/84  Pulse: 74  Temp: 98.8 F (37.1 C)  TempSrc: Oral  SpO2: 97%  Weight: 185 lb (83.9 kg)  Height: 5\' 3"  (1.6 m)   Dg Chest 2 View  Result Date: 09/09/2018 CLINICAL DATA:  Cough and rhonchi EXAM: CHEST - 2 VIEW COMPARISON:  None. FINDINGS: Lungs are clear. Heart size and pulmonary vascularity are normal. No adenopathy. No bone lesions. IMPRESSION: No edema or consolidation. Electronically Signed   By: Bretta Bang III M.D.   On: 09/09/2018 11:46       Assessment & Plan:    Ricky Diaz is a 43 y.o. male Cough - Plan: DG Chest 2 View, azithromycin (ZITHROMAX) 250 MG tablet  LRTI (lower respiratory tract infection) - Plan: azithromycin (ZITHROMAX) 250 MG tablet  Wheeze - Plan: albuterol (PROVENTIL HFA;VENTOLIN HFA) 108 (90 Base) MCG/ACT inhaler  Initial exam with possible rhonchi  right greater than left, some clearing with cough, faint end expiratory wheeze.  Based on description of symptoms possible reactive airway.  Sick contact and improving symptoms, probable viral illness at this time.  -Albuterol if  needed with RTC precautions if frequent/persistent need.  -Printed prescription for azithromycin if cough is not continuing to improve, potential side effects and risks discussed, RTC precautions discussed.  Meds ordered this encounter  Medications  . albuterol (PROVENTIL HFA;VENTOLIN HFA) 108 (90 Base) MCG/ACT inhaler    Sig: Inhale 1-2 puffs into the lungs every 4 (four) hours as needed for wheezing or shortness of breath.    Dispense:  1 Inhaler    Refill:  0  . azithromycin (ZITHROMAX) 250 MG tablet    Sig: Take 2 pills by mouth on day 1, then 1 pill by mouth per day on days 2 through 5.    Dispense:  6 tablet    Refill:  0   Patient Instructions    Although I did hear some possible congestion on your exam, x-ray did not indicate a pneumonia.  As your symptoms are improving okay to hold off on antibiotics at this time, but I did print one to fill if your symptoms do not continue to improve.  If you do have wheezing, can use the albuterol inhaler up to every 4-6 hours but if you require that more frequently or persistently need that frequently over the next few days, please return for recheck.  Return to the clinic or go to the nearest emergency room if any of your symptoms worsen or new symptoms occur.  Cough, Adult  Coughing is a reflex that clears your throat and your airways. Coughing helps to heal and protect your lungs. It is normal to cough occasionally, but a cough that happens with other symptoms or lasts a long time may be a sign of a condition that needs treatment. A cough may last only 2-3 weeks (acute), or it may last longer than 8 weeks (chronic). What are the causes? Coughing is commonly caused by:  Breathing in substances that irritate your  lungs.  A viral or bacterial respiratory infection.  Allergies.  Asthma.  Postnasal drip.  Smoking.  Acid backing up from the stomach into the esophagus (gastroesophageal reflux).  Certain medicines.  Chronic lung problems, including COPD (or rarely, lung cancer).  Other medical conditions such as heart failure. Follow these instructions at home: Pay attention to any changes in your symptoms. Take these actions to help with your discomfort:  Take medicines only as told by your health care provider. ? If you were prescribed an antibiotic medicine, take it as told by your health care provider. Do not stop taking the antibiotic even if you start to feel better. ? Talk with your health care provider before you take a cough suppressant medicine.  Drink enough fluid to keep your urine clear or pale yellow.  If the air is dry, use a cold steam vaporizer or humidifier in your bedroom or your home to help loosen secretions.  Avoid anything that causes you to cough at work or at home.  If your cough is worse at night, try sleeping in a semi-upright position.  Avoid cigarette smoke. If you smoke, quit smoking. If you need help quitting, ask your health care provider.  Avoid caffeine.  Avoid alcohol.  Rest as needed. Contact a health care provider if:  You have new symptoms.  You cough up pus.  Your cough does not get better after 2-3 weeks, or your cough gets worse.  You cannot control your cough with suppressant medicines and you are losing sleep.  You develop pain that is getting worse or pain that is not controlled with  pain medicines.  You have a fever.  You have unexplained weight loss.  You have night sweats. Get help right away if:  You cough up blood.  You have difficulty breathing.  Your heartbeat is very fast. This information is not intended to replace advice given to you by your health care provider. Make sure you discuss any questions you have with  your health care provider. Document Released: 01/05/2011 Document Revised: 12/15/2015 Document Reviewed: 09/15/2014 Elsevier Interactive Patient Education  [employer].   If you have lab work done today you will be contacted with your lab results within the next 2 weeks.  If you have not heard from [place] then please contact us. The fastest way to get your results is to register for My Chart.   IF you received an x-ray today, you will receive an invoice from [facility]. Please contact [facility] at [phone] with questions or concerns regarding your invoice.   IF you received labwork today, you will receive an invoice from [facility]. Please contact [facility] at [phone] with questions or concerns regarding your invoice.   Our billing staff will not be able to assist you with questions regarding bills from these companies.  You will be contacted with the lab results as soon as they are available. The fastest way to get your results is to activate your My Chart account. Instructions are located on the last page of this paperwork. If you have not heard from [place] regarding the results in 2 weeks, please contact this office.       Signed,   [name], MD Primary Care at [facility].  09/09/18 11:58 AM

## 2018-09-09 NOTE — Patient Instructions (Addendum)
Although I did hear some possible congestion on your exam, x-ray did not indicate a pneumonia.  As your symptoms are improving okay to hold off on antibiotics at this time, but I did print one to fill if your symptoms do not continue to improve.  If you do have wheezing, can use the albuterol inhaler up to every 4-6 hours but if you require that more frequently or persistently need that frequently over the next few days, please return for recheck.  Return to the clinic or go to the nearest emergency room if any of your symptoms worsen or new symptoms occur.  Cough, Adult  Coughing is a reflex that clears your throat and your airways. Coughing helps to heal and protect your lungs. It is normal to cough occasionally, but a cough that happens with other symptoms or lasts a long time may be a sign of a condition that needs treatment. A cough may last only 2-3 weeks (acute), or it may last longer than 8 weeks (chronic). What are the causes? Coughing is commonly caused by:  Breathing in substances that irritate your lungs.  A viral or bacterial respiratory infection.  Allergies.  Asthma.  Postnasal drip.  Smoking.  Acid backing up from the stomach into the esophagus (gastroesophageal reflux).  Certain medicines.  Chronic lung problems, including COPD (or rarely, lung cancer).  Other medical conditions such as heart failure. Follow these instructions at home: Pay attention to any changes in your symptoms. Take these actions to help with your discomfort:  Take medicines only as told by your health care provider. ? If you were prescribed an antibiotic medicine, take it as told by your health care provider. Do not stop taking the antibiotic even if you start to feel better. ? Talk with your health care provider before you take a cough suppressant medicine.  Drink enough fluid to keep your urine clear or pale yellow.  If the air is dry, use a cold steam vaporizer or humidifier in your  bedroom or your home to help loosen secretions.  Avoid anything that causes you to cough at work or at home.  If your cough is worse at night, try sleeping in a semi-upright position.  Avoid cigarette smoke. If you smoke, quit smoking. If you need help quitting, ask your health care provider.  Avoid caffeine.  Avoid alcohol.  Rest as needed. Contact a health care provider if:  You have new symptoms.  You cough up pus.  Your cough does not get better after 2-3 weeks, or your cough gets worse.  You cannot control your cough with suppressant medicines and you are losing sleep.  You develop pain that is getting worse or pain that is not controlled with pain medicines.  You have a fever.  You have unexplained weight loss.  You have night sweats. Get help right away if:  You cough up blood.  You have difficulty breathing.  Your heartbeat is very fast. This information is not intended to replace advice given to you by your health care provider. Make sure you discuss any questions you have with your health care provider. Document Released: 01/05/2011 Document Revised: 12/15/2015 Document Reviewed: 09/15/2014 Elsevier Interactive Patient Education  [employer].   If you have lab work done today you will be contacted with your lab results within the next 2 weeks.  If you have not heard from [place] then please contact us. The fastest way to get your results is to register for My Chart.  IF you received an x-ray today, you will receive an invoice from [facility]. Please contact [facility] at [phone] with questions or concerns regarding your invoice.   IF you received labwork today, you will receive an invoice from [facility]. Please contact [facility] at [phone] with questions or concerns regarding your invoice.   Our billing staff will not be able to assist you with questions regarding bills from these companies.  You will be contacted with the  lab results as soon as they are available. The fastest way to get your results is to activate your My Chart account. Instructions are located on the last page of this paperwork. If you have not heard from [place] regarding the results in 2 weeks, please contact this office.

## 2018-10-03 ENCOUNTER — Other Ambulatory Visit: Payer: Self-pay | Admitting: Family Medicine

## 2018-10-03 DIAGNOSIS — R062 Wheezing: Secondary | ICD-10-CM

## 2018-10-03 NOTE — Telephone Encounter (Signed)
Requested Prescriptions  Pending Prescriptions Disp Refills  . albuterol (PROVENTIL HFA;VENTOLIN HFA) 108 (90 Base) MCG/ACT inhaler [Pharmacy Med Name: ALBUTEROL HFA (PROVENTIL) INH] 6.7 Inhaler 0    Sig: INHALE 1-2 PUFFS INTO THE LUNGS EVERY 4 (FOUR) HOURS AS NEEDED FOR WHEEZING OR SHORTNESS OF BREATH.     Pulmonology:  Beta Agonists Failed - 10/03/2018 11:09 AM      Failed - One inhaler should last at least one month. If the patient is requesting refills earlier, contact the patient to check for uncontrolled symptoms.      Passed - Valid encounter within last 12 months    Recent Outpatient Visits          3 weeks ago Cough   [facility] at [facility], Asencion Partridge, MD   4 months ago Essential hypertension   Primary Care at [facility], Madelaine Bhat, PA-C   6 months ago Essential hypertension   Primary [facility] at [facility], Madelaine Bhat, PA-C   1 year ago Hypertension, unspecified type   Primary Care at [place], [place], [place]   1 year ago Glucose intolerance (impaired glucose tolerance)   Primary Care at [facility], Asencion Partridge, MD

## 2018-11-23 ENCOUNTER — Emergency Department (HOSPITAL_COMMUNITY): Payer: 59

## 2018-11-23 ENCOUNTER — Emergency Department (HOSPITAL_COMMUNITY)
Admission: EM | Admit: 2018-11-23 | Discharge: 2018-11-23 | Disposition: A | Discharge status: Home / Self Care | Payer: 59 | Attending: Emergency Medicine | Admitting: Emergency Medicine

## 2018-11-23 ENCOUNTER — Other Ambulatory Visit: Payer: Self-pay

## 2018-11-23 DIAGNOSIS — M25511 Pain in right shoulder: Secondary | ICD-10-CM

## 2018-11-23 DIAGNOSIS — Y999 Unspecified external cause status: Secondary | ICD-10-CM | POA: Diagnosis not present

## 2018-11-23 DIAGNOSIS — Y939 Activity, unspecified: Secondary | ICD-10-CM | POA: Insufficient documentation

## 2018-11-23 DIAGNOSIS — I10 Essential (primary) hypertension: Secondary | ICD-10-CM | POA: Insufficient documentation

## 2018-11-23 DIAGNOSIS — Z79899 Other long term (current) drug therapy: Secondary | ICD-10-CM | POA: Insufficient documentation

## 2018-11-23 DIAGNOSIS — W1830XA Fall on same level, unspecified, initial encounter: Secondary | ICD-10-CM | POA: Diagnosis not present

## 2018-11-23 DIAGNOSIS — Y929 Unspecified place or not applicable: Secondary | ICD-10-CM | POA: Diagnosis not present

## 2018-11-23 DIAGNOSIS — S42431A Displaced fracture (avulsion) of lateral epicondyle of right humerus, initial encounter for closed fracture: Secondary | ICD-10-CM | POA: Diagnosis not present

## 2018-11-23 DIAGNOSIS — W19XXXA Unspecified fall, initial encounter: Secondary | ICD-10-CM

## 2018-11-23 NOTE — Discharge Instructions (Addendum)
You have been seen today for a shoulder injury. You have a fracture of the bone in the upper arm seen on x-ray, however, there could also be injuries to the soft tissues, such as the ligaments or tendons that are not seen.  Antiinflammatory medications: Take 600 mg of ibuprofen every 6 hours or 440 mg (over the counter dose) to 500 mg (prescription dose) of naproxen every 12 hours for the next 3 days. After this time, these medications may be used as needed for pain. Take these medications with food to avoid upset stomach. Choose only one of these medications, do not take them together. Acetaminophen (generic for Tylenol): Should you continue to have additional pain while taking the ibuprofen or naproxen, you may add in acetaminophen as needed. Your daily total maximum amount of acetaminophen from all sources should be limited to 4000mg /day for persons without liver problems, or 2000mg /day for those with liver problems. Ice: May apply ice to the area over the next 24 hours for 15 minutes at a time to reduce swelling. Support: Wear the shoulder immobilizer to keep the shoulder from a full range of motion and for comfort. Wear this until pain resolves or you are told to remove it by orthopedics in follow up.  Follow up: You should follow up with the orthopedic specialist within two weeks. A referral has been provided. Call tomorrow to make an appointment. Return: Return to the ED for numbness, weakness, increasing pain, overall worsening symptoms, loss of function, or if symptoms are not improving, you have tried to follow up with the orthopedic specialist, and have been unable to do so.  For prescription assistance, may try using prescription discount sites or apps, such as goodrx.com

## 2018-11-23 NOTE — ED Provider Notes (Signed)
[facility] EMERGENCY DEPARTMENT Provider Note   CSN: [id number] Arrival date & time: 11/23/18  2127    History   Chief Complaint Chief Complaint  Patient presents with  . Arm Pain  . Fall    HPI Waleed Naifeh Slayton is a 43 y.o. male.     HPI   Khaaliq Sharpley Whittington is a 43 y.o. male, with a history of HTN and hyperlipidemia, presenting to the ED with pain to the right shoulder following a fall.  Fall occurred shortly prior to arrival.  States he was holding his daughter, tripped, and fell landing on the right elbow and shoulder.  Denies head injury, LOC, neck/back pain, elbow or wrist pain, chest pain, shortness of breath, numbness, weakness, or any other complaints.  Past Medical History:  Diagnosis Date  . Glucose intolerance (impaired glucose tolerance)   . Hyperlipidemia   . Hypertension    onset age 78.  . OSA on CPAP 07/23/2012  . Sleep difficulties     Patient Active Problem List   Diagnosis Date Noted  . 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 Sleep difficulties   . Hypokalemia 06/09/2014  . HTN (hypertension) 08/06/2012    Past Surgical History:  Procedure Laterality Date  . NO PAST SURGERIES          Home Medications    Prior to Admission medications   Medication Sig Start Date End Date Taking? Authorizing Provider  albuterol (PROVENTIL HFA;VENTOLIN HFA) 108 (90 Base) MCG/ACT inhaler INHALE 1-2 PUFFS INTO THE LUNGS EVERY 4 (FOUR) HOURS AS NEEDED FOR WHEEZING OR SHORTNESS OF BREATH. 10/03/18   Shade Flood, MD  amLODipine (NORVASC) 10 MG tablet Take 1 tablet (10 mg total) by mouth daily. 06/05/18   McVey, Madelaine Bhat, PA-C  azithromycin (ZITHROMAX) 250 MG tablet Take 2 pills by mouth on day 1, then 1 pill by mouth per day on days 2 through 5. 09/09/18   Shade Flood, MD  ferrous sulfate 325 (65 FE) MG tablet Take 325 mg by mouth daily with breakfast. 03/17/16   [provider]  labetalol (NORMODYNE) 200 MG  tablet Take 1 tablet (200 mg total) by mouth 2 (two) times daily. 06/05/18   McVey, Madelaine Bhat, PA-C  lisinopril (PRINIVIL,ZESTRIL) 10 MG tablet TAKE 1 TABLET BY MOUTH EVERY DAY 06/05/18   McVey, Madelaine Bhat, PA-C  spironolactone (ALDACTONE) 25 MG tablet Take 1 tablet (25 mg total) by mouth daily. 03/25/18   McVey, Madelaine Bhat, PA-C    Family History Family History  Problem Relation Age of Onset  . Hypertension Brother   . Sleep apnea Brother   . Sleep apnea Brother     Social History Social History   Tobacco Use  . Smoking status: Never Smoker  . Smokeless tobacco: Never Used  Substance Use Topics  . Alcohol use: Yes    Alcohol/week: 0.0 standard drinks    Comment: rare  . Drug use: No     Allergies   Patient has no known allergies.   Review of Systems Review of Systems  Constitutional: Negative for diaphoresis.  Respiratory: Negative for shortness of breath.   Cardiovascular: Negative for chest pain.  Gastrointestinal: Negative for abdominal pain, nausea and vomiting.  Musculoskeletal: Positive for arthralgias. Negative for back pain and neck pain.  Neurological: Negative for dizziness, syncope, weakness, light-headedness, numbness and headaches.  All other systems reviewed and are negative.    Physical Exam Updated Vital Signs BP (!) 183/96 (BP Location: Right  Arm)   Pulse (!) 108   Temp 99.5 F (37.5 C) (Oral)   Resp 18   Ht  (1.6 m)   Wt 83.9 kg   SpO2 97%   BMI 32.77 kg/m   Physical Exam Vitals signs and nursing note reviewed.  Constitutional:      General: He is not in acute distress.    Appearance: He is well-developed. He is not diaphoretic.  HENT:     Head: Normocephalic and atraumatic.     Mouth/Throat:     Mouth: Mucous membranes are moist.     Pharynx: Oropharynx is clear.  Eyes:     Conjunctiva/sclera: Conjunctivae normal.  Neck:     Musculoskeletal: Neck supple.  Cardiovascular:     Rate and Rhythm: Normal  rate and regular rhythm.     Pulses: Normal pulses.          Radial pulses are 2+ on the right side and 2+ on the left side.     Heart sounds: Normal heart sounds.     Comments: Tactile temperature in the extremities appropriate and equal bilaterally. Pulmonary:     Effort: Pulmonary effort is normal. No respiratory distress.     Breath sounds: Normal breath sounds.  Abdominal:     Palpations: Abdomen is soft.     Tenderness: There is no abdominal tenderness. There is no guarding.  Musculoskeletal:     Right lower leg: No edema.     Left lower leg: No edema.     Comments: Patient has no tenderness, swelling, or deformity to the right shoulder, arm, or elbow.  No pain with range of motion of the right elbow or wrist.  He can perform range of motion exercises to the right shoulder without noted hesitation or difficulty.  Patient does complain of pain with opposing empty can test and opposing abduction.  He has a superficial appearing abrasion to the right proximal forearm.  Normal motor function intact in all other extremities. No midline spinal tenderness.   Lymphadenopathy:     Cervical: No cervical adenopathy.  Skin:    General: Skin is warm and dry.     Capillary Refill: Capillary refill takes less than 2 seconds.     Coloration: Skin is not pale.  Neurological:     Mental Status: He is alert.     Comments: Sensation grossly intact to light touch through each of the nerve distributions of the bilateral upper extremities. Abduction and adduction of the fingers intact against resistance. Grip strength equal bilaterally. Supination and pronation intact against resistance. Strength 5/5 through the cardinal directions of the bilateral wrists. Strength 5/5 with flexion and extension of the bilateral elbows. Strength 5/5 in the cardinal directions of the bilateral shoulders. Patient can touch the thumb to each one of the fingertips without difficulty.   Psychiatric:        Mood and  Affect: Mood and affect normal.        Speech: Speech normal.        Behavior: Behavior normal.      ED Treatments / Results  Labs (all labs ordered are listed, but only abnormal results are displayed) Labs Reviewed - No data to display  EKG None  Radiology No results found.  Procedures Procedures (including critical care time)  Medications Ordered in ED Medications - No data to display   Initial Impression / Assessment and Plan / ED Course  I have reviewed the triage vital signs and the nursing  notes.  Pertinent labs & imaging results that were available during my care of the patient were reviewed by me and considered in my medical decision making (see chart for details).        Patient presents with complaint of right shoulder pain following a fall.  No evidence of neurovascular compromise.  End of shift patient care handoff report given to Elpidio AnisShari Upstill, PA-C. Plan: X-ray of the right shoulder pending.  May place patient in sling for comfort.  Orthopedic follow-up.     Final Clinical Impressions(s) / ED Diagnoses   Final diagnoses:  Fall, initial encounter  Acute pain of right shoulder    ED Discharge Orders    None       Concepcion LivingJoy,  C, PA-C 11/23/18 2220    Pricilla LovelessGoldston, Scott, MD 11/26/18 1152

## 2018-11-23 NOTE — ED Triage Notes (Signed)
Patient states that he tried to catch his daughter, lost his balance and fell.  C/O right upper arm pain. RN notes a right medial forearm abrasion.  Patient has good range of motion in both arms.

## 2018-11-24 ENCOUNTER — Telehealth: Payer: Self-pay

## 2018-11-24 NOTE — Telephone Encounter (Signed)
Spoke with the patient and he has given verbal consent to do a [email or web address] visit with Megan on 11/27/2018 at 8:30 AM. A e-mail has been sent to [email or web address].

## 2018-11-26 ENCOUNTER — Encounter: Payer: Self-pay | Admitting: Neurology

## 2018-11-26 NOTE — Telephone Encounter (Signed)
Pt states he has not received the e-mail. Please resend again.

## 2018-11-26 NOTE — Telephone Encounter (Signed)
E-mail has been sent with instructions on how to access his [email or web address] visit with Megan on 11/27/2018.

## 2018-11-27 ENCOUNTER — Other Ambulatory Visit: Payer: Self-pay | Admitting: Physician Assistant

## 2018-11-27 ENCOUNTER — Other Ambulatory Visit: Payer: Self-pay

## 2018-11-27 ENCOUNTER — Telehealth: Payer: Self-pay

## 2018-11-27 ENCOUNTER — Ambulatory Visit (INDEPENDENT_AMBULATORY_CARE_PROVIDER_SITE_OTHER): Payer: 59 | Admitting: Adult Health

## 2018-11-27 DIAGNOSIS — Z9989 Dependence on other enabling machines and devices: Secondary | ICD-10-CM

## 2018-11-27 DIAGNOSIS — G4733 Obstructive sleep apnea (adult) (pediatric): Secondary | ICD-10-CM | POA: Diagnosis not present

## 2018-11-27 DIAGNOSIS — I10 Essential (primary) hypertension: Secondary | ICD-10-CM

## 2018-11-27 NOTE — Telephone Encounter (Signed)
Spoke with Gunnar Fusi from [facility] about needing a updated cpap report for the patient for his appt today with Butch Penny, NP. She stated that she would upload it into [facility]. Aundra Millet, NP is aware.   [facility] Tel: [phone] Fax: [phone]

## 2018-11-27 NOTE — Progress Notes (Signed)
PATIENT: Ricky Diaz DOB: July 14, 1976  REASON FOR VISIT: follow up HISTORY FROM: patient  Virtual Visit via Video Note  I connected with Ricky Diaz on 11/27/18 at  8:30 AM EDT by a video enabled telemedicine application located remotely at [facility] and verified that I am speaking with the correct person using two identifiers who was located at their own home.   I discussed the limitations of evaluation and management by telemedicine and the availability of in person appointments. The patient expressed understanding and agreed to proceed.   PATIENT: Ricky Diaz DOB: [birth year]  REASON FOR VISIT: follow up HISTORY FROM: patient  HISTORY OF PRESENT ILLNESS: Today 11/27/18:  Ricky Diaz is a 43 year old male with a history of obstructive sleep apnea on CPAP.  He returns today for a virtual visit.  His download indicates that he uses machine 30 out of 30 days for compliance of 100%.  He uses machine greater than 4 hours 29 out of 30 days for compliance of 97%.  On average he uses his machine 9 hours and 11 minutes.  His residual AHI is 0.4 on 12 cm of water.  He does have a significant leak in the 95th percentile at 78.3 L/min.  The patient notes that he continues to get good benefit with his CPAP.  HISTORY 11/25/17 Mr. Weatherman is a 43 year old male with a history of obstructive sleep apnea on CPAP.  He returns today for follow-up.  His download indicates that he use his machine nightly for a compliance of 100%.  He uses machine greater than 4 hours every night.  On average he uses his machine 9 hours and 29 minutes.  His residual AHI is 0.4 on 12 cm of water with EPR of 2.  He does have a leak in the 95th percentile at 34.4 L/min.  This download is from January 2019.  We were unable to get a more recent download however the patient states that he has been using the machine consistently.  He returns today for evaluation.  REVIEW OF SYSTEMS: Out of  a complete 14 system review of symptoms, the patient complains only of the following symptoms, and all other reviewed systems are negative.  See HPI  ALLERGIES: No Known Allergies  HOME MEDICATIONS: Outpatient Medications Prior to Visit  Medication Sig Dispense Refill  . amLODipine (NORVASC) 10 MG tablet Take 1 tablet (10 mg total) by mouth daily. 90 tablet 3  . labetalol (NORMODYNE) 200 MG tablet TAKE 1 TABLET BY MOUTH TWICE A DAY 180 tablet 1  . lisinopril (PRINIVIL,ZESTRIL) 10 MG tablet TAKE 1 TABLET BY MOUTH EVERY DAY 90 tablet 1  . spironolactone (ALDACTONE) 25 MG tablet Take 1 tablet (25 mg total) by mouth daily. 90 tablet 1  . albuterol (PROVENTIL HFA;VENTOLIN HFA) 108 (90 Base) MCG/ACT inhaler INHALE 1-2 PUFFS INTO THE LUNGS EVERY 4 (FOUR) HOURS AS NEEDED FOR WHEEZING OR SHORTNESS OF BREATH. 6.7 Inhaler 0  . azithromycin (ZITHROMAX) 250 MG tablet Take 2 pills by mouth on day 1, then 1 pill by mouth per day on days 2 through 5. 6 tablet 0  . ferrous sulfate 325 (65 FE) MG tablet Take 325 mg by mouth daily with breakfast.  0   No facility-administered medications prior to visit.     PAST MEDICAL HISTORY: Past Medical History:  Diagnosis Date  . Glucose intolerance (impaired glucose tolerance)   . Hyperlipidemia   . Hypertension    onset age 43.  Marland Kitchen  OSA on CPAP 07/23/2012  . Sleep difficulties     PAST SURGICAL HISTORY: Past Surgical History:  Procedure Laterality Date  . NO PAST SURGERIES      FAMILY HISTORY: Family History  Problem Relation Age of Onset  . Hypertension Brother   . Sleep apnea Brother   . Sleep apnea Brother     SOCIAL HISTORY: Social History   Socioeconomic History  . Marital status: Married    Spouse name: [name]  . Number of children: 4  . Years of education: 3514  . Highest education level: Not on file  Occupational History  . Not on file  Social Needs  . Financial resource strain: Not on file  . Food insecurity:    Worry: Not on  file    Inability: Not on file  . Transportation needs:    Medical: Not on file    Non-medical: Not on file  Tobacco Use  . Smoking status: Never Smoker  . Smokeless tobacco: Never Used  Substance and Sexual Activity  . Alcohol use: Yes    Alcohol/week: 0.0 standard drinks    Comment: rare  . Drug use: No  . Sexual activity: Yes    Partners: Female  Lifestyle  . Physical activity:    Days per week: Not on file    Minutes per session: Not on file  . Stress: Not on file  Relationships  . Social connections:    Talks on phone: Not on file    Gets together: Not on file    Attends religious service: Not on file    Active member of club or organization: Not on file    Attends meetings of clubs or organizations: Not on file    Relationship status: Not on file  . Intimate partner violence:    Fear of current or ex partner: Not on file    Emotionally abused: Not on file    Physically abused: Not on file    Forced sexual activity: Not on file  Other Topics Concern  . Not on file  Social History Narrative   Marital status: married x 2007. From [place] since [place].      Children: 4 children; no grandchildren.      Lives: with wife ([name]) , children.      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      Tobacco: none      Alcohol: none      Drugs: none      Exercise:  Sporadic.   Patient is right-handed.   Patient has a college education.   Patient drinks very little caffeine.      PHYSICAL EXAM   Generalized: Well developed, in no acute distress   Neurological examination  Mentation: Alert oriented to time, place, history taking. Follows all commands speech and language fluent Cranial nerve II-XII:  Extraocular movements were full, visual field were full on confrontational test. Facial symmetry noted. uvula tongue midline. Head turning and shoulder shrug  were normal and symmetric. Motor: Good strength throughout subjectively per patient Sensory:  Sensory testing is intact to soft touch on all 4 extremities subjectively per patient Reflexes: UTA   DIAGNOSTIC DATA (LABS, IMAGING, TESTING) - I reviewed patient records, labs, notes, testing and imaging myself where available.  Lab Results  Component Value Date   WBC 4.5 04/02/2016   HGB 13.2 04/02/2016   HCT 39.9 04/02/2016   MCV 91.3 04/02/2016   PLT 203 04/02/2016  Component Value Date/Time   NA 140 03/25/2018 1128   K 4.3 03/25/2018 1128   CL 105 03/25/2018 1128   CO2 20 03/25/2018 1128   GLUCOSE 114 (H) 03/25/2018 1128   GLUCOSE 111 (H) 04/02/2016 0958   BUN 15 03/25/2018 1128   CREATININE 1.14 03/25/2018 1128   CREATININE 1.14 04/02/2016 0958   CALCIUM 10.0 03/25/2018 1128   PROT 7.3 03/25/2018 1128   ALBUMIN 4.5 03/25/2018 1128   AST 26 03/25/2018 1128   ALT 56 (H) 03/25/2018 1128   ALKPHOS 59 03/25/2018 1128   BILITOT 0.4 03/25/2018 1128   GFRNONAA 78 03/25/2018 1128   GFRNONAA 79 04/02/2016 0958   GFRAA 90 03/25/2018 1128   GFRAA >89 04/02/2016 0958   Lab Results  Component Value Date   CHOL 164 04/02/2016   HDL 49 04/02/2016   LDLCALC 92 04/02/2016   TRIG 116 04/02/2016   CHOLHDL 3.3 04/02/2016   Lab Results  Component Value Date   HGBA1C 6.5 (A) 03/25/2018   Lab Results  Component Value Date   VITAMINB12 1,027 (H) 08/20/2015   Lab Results  Component Value Date   TSH 0.795 08/20/2015      ASSESSMENT AND PLAN 43 y.o. year old male  has a past medical history of Glucose intolerance (impaired glucose tolerance), Hyperlipidemia, Hypertension, OSA on CPAP (07/23/2012), and Sleep difficulties. here with:  1: OSA ON CPAP  The patient CPAP download shows excellent compliance and good treatment of his apnea however he does have a significant leak.  The patient download was not available for me to review until his return visit was completed.  I have called the patient and left him a message to call our office.  I will suggest that we perhaps do a  mask refitting due to his significant leak.  While on the virtual visit he is advised that if his symptoms worsen or he develops new symptoms he should let us know.  He will follow-up in 6 months or sooner if needed.   I spent 15 minutes with the patient reviewing the patient's chart prior to the visit.  Reviewing CPAP download and discussing plan of care with the patient.   Butch Penny, MSN, NP-C 11/27/2018, 8:44 AM [facility] [address] [place], [place] [address] [phone]

## 2018-12-01 ENCOUNTER — Encounter: Payer: Self-pay | Admitting: Adult Health

## 2018-12-10 ENCOUNTER — Telehealth: Payer: Self-pay

## 2018-12-10 NOTE — Telephone Encounter (Signed)
Follow up has been scheduled. Patient is aware of appt day and time.   

## 2019-01-19 ENCOUNTER — Other Ambulatory Visit: Payer: Self-pay | Admitting: Physician Assistant

## 2019-01-19 DIAGNOSIS — I10 Essential (primary) hypertension: Secondary | ICD-10-CM

## 2019-01-19 NOTE — Telephone Encounter (Signed)
Mcvey patient please advise

## 2019-03-24 ENCOUNTER — Other Ambulatory Visit: Payer: Self-pay | Admitting: Physician Assistant

## 2019-03-24 DIAGNOSIS — I10 Essential (primary) hypertension: Secondary | ICD-10-CM

## 2019-03-24 NOTE — Telephone Encounter (Signed)
Requested medications are due for refill today?  Yes  Requested medications are on the active medication list?  Yes  Last refill Amlodipine #90, 3 refills on 06/05/2018, spironolactone #90, 1 refill 03/25/2018  Future visit scheduled?  No  Notes to clinic:  Left voicemail for patient to call to schedule follow up appointment.   Requested Prescriptions  Pending Prescriptions Disp Refills   amLODipine (NORVASC) 10 MG tablet [Pharmacy Med Name: AMLODIPINE BESYLATE 10 MG TAB] 90 tablet 3    Sig: TAKE 1 TABLET BY MOUTH EVERY DAY     Cardiovascular:  Calcium Channel Blockers Failed - 03/24/2019 11:13 AM      Failed - Last BP in normal range    BP Readings from Last 1 Encounters:  11/23/18 (!) 183/96         Failed - Valid encounter within last 6 months    Recent Outpatient Visits          6 months ago Cough   [facility], Ranell Patrick, MD   9 months ago Essential hypertension   [facility] at [facility], Gelene Mink, PA-C   12 months ago Essential hypertension   [facility] at [facility], Gelene Mink, PA-C   1 year ago Hypertension, unspecified type   [facility] [place], [place], [place]   2 years ago Glucose intolerance (impaired glucose tolerance)   [facility], Ranell Patrick, MD              spironolactone (ALDACTONE) 25 MG tablet [Pharmacy Med Name: SPIRONOLACTONE 25 MG TABLET] 90 tablet 3    Sig: TAKE 1 TABLET BY MOUTH EVERY DAY     Cardiovascular: Diuretics - Aldosterone Antagonist Failed - 03/24/2019 11:13 AM      Failed - Cr in normal range and within 360 days    Creat  Date Value Ref Range Status  04/02/2016 1.14 0.60 - 1.35 mg/dL Final   Creatinine, Ser  Date Value Ref Range Status  03/25/2018 1.14 0.76 - 1.27 mg/dL Final         Failed - K in normal range and within 360 days    Potassium  Date Value Ref Range Status  03/25/2018 4.3 3.5 - 5.2 mmol/L Final         Failed - Na in normal range and  within 360 days    Sodium  Date Value Ref Range Status  03/25/2018 140 134 - 144 mmol/L Final         Failed - Last BP in normal range    BP Readings from Last 1 Encounters:  11/23/18 (!) 183/96         Failed - Valid encounter within last 6 months    Recent Outpatient Visits          6 months ago Cough   [facility] [place], [place]   9 months ago Essential hypertension   [facility] at [facility], Gelene Mink, PA-C   12 months ago Essential hypertension   [facility], [place], PA-C   1 year ago Hypertension, unspecified type   [facility] at [place] [facility], [place], [place]   2 years ago Glucose intolerance (impaired glucose tolerance)   [facility] at [facility], Ranell Patrick, MD

## 2019-03-25 NOTE — Telephone Encounter (Signed)
Refill on medication for a 14 days until his appointment

## 2019-04-02 ENCOUNTER — Other Ambulatory Visit: Payer: Self-pay | Admitting: Registered Nurse

## 2019-04-02 DIAGNOSIS — I10 Essential (primary) hypertension: Secondary | ICD-10-CM

## 2019-04-06 ENCOUNTER — Encounter: Payer: Self-pay | Admitting: Registered Nurse

## 2019-04-06 ENCOUNTER — Ambulatory Visit: Payer: 59 | Admitting: Registered Nurse

## 2019-04-06 ENCOUNTER — Other Ambulatory Visit: Payer: Self-pay

## 2019-04-06 VITALS — BP 120/80 | HR 82 | Temp 99.0°F | Resp 16 | Ht 62.99 in | Wt 188.0 lb

## 2019-04-06 DIAGNOSIS — Z13220 Encounter for screening for lipoid disorders: Secondary | ICD-10-CM | POA: Diagnosis not present

## 2019-04-06 DIAGNOSIS — Z1329 Encounter for screening for other suspected endocrine disorder: Secondary | ICD-10-CM

## 2019-04-06 DIAGNOSIS — I10 Essential (primary) hypertension: Secondary | ICD-10-CM

## 2019-04-06 DIAGNOSIS — Z23 Encounter for immunization: Secondary | ICD-10-CM

## 2019-04-06 DIAGNOSIS — Z13228 Encounter for screening for other metabolic disorders: Secondary | ICD-10-CM

## 2019-04-06 DIAGNOSIS — Z7689 Persons encountering health services in other specified circumstances: Secondary | ICD-10-CM

## 2019-04-06 DIAGNOSIS — Z130 Encounter for screening for diseases of the blood and blood-forming organs and certain disorders involving the immune mechanism: Secondary | ICD-10-CM

## 2019-04-06 MED ORDER — SPIRONOLACTONE 25 MG PO TABS: 25.0000 mg | ORAL_TABLET | Freq: Every day | ORAL | 3 refills | Status: DC

## 2019-04-06 MED ORDER — AMLODIPINE BESYLATE 10 MG PO TABS: 10.0000 mg | ORAL_TABLET | Freq: Every day | ORAL | 3 refills | Status: DC

## 2019-04-06 MED ORDER — LISINOPRIL 10 MG PO TABS: ORAL_TABLET | ORAL | 3 refills | Status: DC

## 2019-04-06 MED ORDER — LABETALOL HCL 200 MG PO TABS: 200.0000 mg | ORAL_TABLET | Freq: Two times a day (BID) | ORAL | 3 refills | Status: DC

## 2019-04-06 NOTE — Patient Instructions (Signed)
° ° ° °  If you have lab work done today you will be contacted with your lab results within the next 2 weeks.  If you have not heard from us then please contact us. The fastest way to get your results is to register for My Chart. ° ° °IF you received an x-ray today, you will receive an invoice from [facility]. Please contact [facility] at [phone] with questions or concerns regarding your invoice.  ° °IF you received labwork today, you will receive an invoice from [facility]. Please contact [facility] at [phone] with questions or concerns regarding your invoice.  ° °Our billing staff will not be able to assist you with questions regarding bills from these companies. ° °You will be contacted with the lab results as soon as they are available. The fastest way to get your results is to activate your My Chart account. Instructions are located on the last page of this paperwork. If you have not heard from us regarding the results in 2 weeks, please contact this office. °  ° ° ° °

## 2019-04-06 NOTE — Progress Notes (Signed)
Established Patient Office Visit  Subjective:  Patient ID: Ricky Diaz, male    DOB: 12/29/1975  Age: 43 y.o. MRN: [id number]  CC:  Chief Complaint  Patient presents with  . Establish Care    need new pcp to manage medications   . Medication Refill    all meds    HPI Ricky Diaz presents for [facility] and med refills  Formerly patient of [facility]. Medical history significant for HTN, HLD, OSA (on CPAP), and glucose intolerance.  He is taking 4 agents to control BP: amlodipine 10mg  PO qd, labetalol 200mg  PO bid, Lisinopril 10mg  PO qd, and spironolactone 25mg  PO qd. Well controlled, no complaints, denies HA, chest pain, shob, dependent edema, visual changes   Overall feels well. Works for [employer] - likes work Engineer, maintenance.   Past Medical History:  Diagnosis Date  . Glucose intolerance (impaired glucose tolerance)   . Hyperlipidemia   . Hypertension    onset age 41.  . OSA on CPAP 07/23/2012  . Sleep difficulties     Past Surgical History:  Procedure Laterality Date  . NO PAST SURGERIES      Family History  Problem Relation Age of Onset  . Hypertension Brother   . Sleep apnea Brother   . Sleep apnea Brother     Social History   Socioeconomic History  . Marital status: Married    Spouse name: [name]  . Number of children: 4  . Years of education: 49  . Highest education level: Not on file  Occupational History    Employer: [employer]  Social Needs  . Financial resource strain: Not hard at all  . Food insecurity    Worry: Never true    Inability: Never true  . Transportation needs    Medical: No    Non-medical: No  Tobacco Use  . Smoking status: Never Smoker  . Smokeless tobacco: Never Used  Substance and Sexual Activity  . Alcohol use: Yes    Alcohol/week: 0.0 standard drinks    Comment: rare  . Drug use: No  . Sexual activity: Yes    Partners: Female  Lifestyle  . Physical activity    Days per week: 4 days   Minutes per session: 30 min  . Stress: Only a little  Relationships  . Social Herbalist on phone: Three times a week    Gets together: Twice a week    Attends religious service: Patient refused    Active member of club or organization: Patient refused    Attends meetings of clubs or organizations: Patient refused    Relationship status: Married  . Intimate partner violence    Fear of current or ex partner: No    Emotionally abused: No    Physically abused: No    Forced sexual activity: No  Other Topics Concern  . Not on file  Social History Narrative   Marital status: married x 2007. From [place]; [place] since 2000.      Children: 4 children; no grandchildren.      Lives: with wife ([name]) , children.      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      Tobacco: none      Alcohol: none      Drugs: none      Exercise:  Sporadic.   Patient is right-handed.   Patient has a college education.   Patient drinks very little caffeine.  Outpatient Medications Prior to Visit  Medication Sig Dispense Refill  . amLODipine (NORVASC) 10 MG tablet TAKE 1 TABLET BY MOUTH EVERY DAY 15 tablet 0  . labetalol (NORMODYNE) 200 MG tablet TAKE 1 TABLET BY MOUTH TWICE A DAY 180 tablet 1  . lisinopril (PRINIVIL,ZESTRIL) 10 MG tablet TAKE 1 TABLET BY MOUTH EVERY DAY 90 tablet 1  . spironolactone (ALDACTONE) 25 MG tablet TAKE 1 TABLET BY MOUTH EVERY DAY 15 tablet 0   No facility-administered medications prior to visit.     Allergies  Allergen Reactions  . Other     ROS Review of Systems  Constitutional: Negative.   HENT: Negative.   Eyes: Negative.  Negative for visual disturbance.  Respiratory: Negative.  Negative for shortness of breath.   Cardiovascular: Negative.  Negative for chest pain, palpitations and leg swelling.  Gastrointestinal: Negative.   Endocrine: Negative.   Genitourinary: Negative.   Musculoskeletal: Negative.   Skin: Negative.    Allergic/Immunologic: Negative.   Neurological: Negative.   Hematological: Negative.   Psychiatric/Behavioral: Negative.   All other systems reviewed and are negative.     Objective:    Physical Exam  Constitutional: He is oriented to person, place, and time. He appears well-developed and well-nourished. No distress.  Cardiovascular: Normal rate, regular rhythm and normal heart sounds.  Pulmonary/Chest: Effort normal. No respiratory distress.  Neurological: He is alert and oriented to person, place, and time.  Skin: Skin is warm and dry. No rash noted. He is not diaphoretic. No erythema. No pallor.  Psychiatric: He has a normal mood and affect. His behavior is normal. Judgment and thought content normal.  Nursing note and vitals reviewed.   BP 120/80   Pulse 82   Temp 99 F (37.2 C) (Oral)   Resp 16   Ht 5' 2.99" (1.6 m)   Wt 188 lb (85.3 kg)   SpO2 97%   BMI 33.31 kg/m  Wt Readings from Last 3 Encounters:  04/06/19 188 lb (85.3 kg)  11/23/18 185 lb (83.9 kg)  09/09/18 185 lb (83.9 kg)     There are no preventive care reminders to display for this patient.  There are no preventive care reminders to display for this patient.  Lab Results  Component Value Date   TSH 0.795 08/20/2015   Lab Results  Component Value Date   WBC 4.5 04/02/2016   HGB 13.2 04/02/2016   HCT 39.9 04/02/2016   MCV 91.3 04/02/2016   PLT 203 04/02/2016   Lab Results  Component Value Date   NA 140 03/25/2018   K 4.3 03/25/2018   CO2 20 03/25/2018   GLUCOSE 114 (H) 03/25/2018   BUN 15 03/25/2018   CREATININE 1.14 03/25/2018   BILITOT 0.4 03/25/2018   ALKPHOS 59 03/25/2018   AST 26 03/25/2018   ALT 56 (H) 03/25/2018   PROT 7.3 03/25/2018   ALBUMIN 4.5 03/25/2018   CALCIUM 10.0 03/25/2018   Lab Results  Component Value Date   CHOL 164 04/02/2016   Lab Results  Component Value Date   HDL 49 04/02/2016   Lab Results  Component Value Date   LDLCALC 92 04/02/2016   Lab  Results  Component Value Date   TRIG 116 04/02/2016   Lab Results  Component Value Date   CHOLHDL 3.3 04/02/2016   Lab Results  Component Value Date   HGBA1C 6.5 (A) 03/25/2018      Assessment & Plan:   Problem List Items Addressed This Visit  Cardiovascular and Mediastinum   HTN (hypertension)   Relevant Medications   amLODipine (NORVASC) 10 MG tablet   labetalol (NORMODYNE) 200 MG tablet   lisinopril (ZESTRIL) 10 MG tablet   spironolactone (ALDACTONE) 25 MG tablet    Other Visit Diagnoses    Flu vaccine need    -  Primary   Relevant Orders   Flu Vaccine QUAD 36+ mos IM (Completed)   Screening for endocrine, metabolic and immunity disorder       Relevant Orders   CBC with Differential/Platelet   Comprehensive metabolic panel   Hemoglobin A1c   Lipid screening       Relevant Orders   Lipid panel      Meds ordered this encounter  Medications  . amLODipine (NORVASC) 10 MG tablet    Sig: Take 1 tablet (10 mg total) by mouth daily.    Dispense:  90 tablet    Refill:  3    DX Code Needed  .    Order Specific Question:   Supervising Provider    Answer:   Doristine Bosworth [id number]  . labetalol (NORMODYNE) 200 MG tablet    Sig: Take 1 tablet (200 mg total) by mouth 2 (two) times daily.    Dispense:  180 tablet    Refill:  3    Order Specific Question:   Supervising Provider    Answer:   Collie Siad A [id number]  . lisinopril (ZESTRIL) 10 MG tablet    Sig: TAKE 1 TABLET BY MOUTH EVERY DAY    Dispense:  90 tablet    Refill:  3    Order Specific Question:   Supervising Provider    Answer:   Collie Siad A [id number]  . spironolactone (ALDACTONE) 25 MG tablet    Sig: Take 1 tablet (25 mg total) by mouth daily.    Dispense:  90 tablet    Refill:  3    Order Specific Question:   Supervising Provider    Answer:   Doristine Bosworth [id number]    Follow-up: No follow-ups on file.   PLAN  Refills x 1 year - pt BP at 120/80 today very reassuring. Discussed  returning for CPE in around 6 mos, sooner with any symptoms  Labs drawn, will follow up with results as warranted.  Patient encouraged to call clinic with any questions, comments, or concerns.   Janeece Agee, NP

## 2019-04-07 LAB — COMPREHENSIVE METABOLIC PANEL
ALT: 32 IU/L (ref 0–44)
AST: 21 IU/L (ref 0–40)
Albumin/Globulin Ratio: 2 (ref 1.2–2.2)
Albumin: 4.8 g/dL (ref 4.0–5.0)
Alkaline Phosphatase: 63 IU/L (ref 39–117)
BUN/Creatinine Ratio: 10 (ref 9–20)
BUN: 11 mg/dL (ref 6–24)
Bilirubin Total: 0.2 mg/dL (ref 0.0–1.2)
CO2: 21 mmol/L (ref 20–29)
Calcium: 10 mg/dL (ref 8.7–10.2)
Chloride: 103 mmol/L (ref 96–106)
Creatinine, Ser: 1.09 mg/dL (ref 0.76–1.27)
GFR calc Af Amer: 94 mL/min/{1.73_m2} (ref >59)
GFR calc non Af Amer: 82 mL/min/{1.73_m2} (ref >59)
Globulin, Total: 2.4 g/dL (ref 1.5–4.5)
Glucose: 137 mg/dL — ABNORMAL HIGH (ref 65–99)
Potassium: 4.7 mmol/L (ref 3.5–5.2)
Sodium: 140 mmol/L (ref 134–144)
Total Protein: 7.2 g/dL (ref 6.0–8.5)

## 2019-04-07 LAB — LIPID PANEL
Chol/HDL Ratio: 3.9 ratio (ref 0.0–5.0)
Cholesterol, Total: 149 mg/dL (ref 100–199)
HDL: 38 mg/dL — ABNORMAL LOW (ref >39)
LDL Chol Calc (NIH): 100 mg/dL — ABNORMAL HIGH (ref 0–99)
Triglycerides: 52 mg/dL (ref 0–149)
VLDL Cholesterol Cal: 11 mg/dL (ref 5–40)

## 2019-04-07 LAB — CBC WITH DIFFERENTIAL/PLATELET
Basophils Absolute: 0 10*3/uL (ref 0.0–0.2)
Basos: 1 %
EOS (ABSOLUTE): 0 10*3/uL (ref 0.0–0.4)
Eos: 1 %
Hematocrit: 40.3 % (ref 37.5–51.0)
Hemoglobin: 13.5 g/dL (ref 13.0–17.7)
Immature Grans (Abs): 0 10*3/uL (ref 0.0–0.1)
Immature Granulocytes: 0 %
Lymphocytes Absolute: 1.2 10*3/uL (ref 0.7–3.1)
Lymphs: 40 %
MCH: 30.1 pg (ref 26.6–33.0)
MCHC: 33.5 g/dL (ref 31.5–35.7)
MCV: 90 fL (ref 79–97)
Monocytes Absolute: 0.4 10*3/uL (ref 0.1–0.9)
Monocytes: 13 %
Neutrophils Absolute: 1.3 10*3/uL — ABNORMAL LOW (ref 1.4–7.0)
Neutrophils: 45 %
Platelets: 191 10*3/uL (ref 150–450)
RBC: 4.48 x10E6/uL (ref 4.14–5.80)
RDW: 13 % (ref 11.6–15.4)
WBC: 2.9 10*3/uL — ABNORMAL LOW (ref 3.4–10.8)

## 2019-04-07 LAB — HEMOGLOBIN A1C
Est. average glucose Bld gHb Est-mCnc: 146 mg/dL
Hgb A1c MFr Bld: 6.7 % — ABNORMAL HIGH (ref 4.8–5.6)

## 2019-04-07 NOTE — Progress Notes (Signed)
Pass [facility] called Ricky Diaz and spoke with him regarding his lab results. His A1c is unfortunately at 6.7% - his other labs are alright though. He doesn't want to go on medication at this time, but we will need him to come in in around 6 months to recheck his A1c. Would you be able to give him a call and schedule him for this? I'd prefer a provider visit with a POCT A1c.  Thank you so much!  Kathrin Ruddy, NP

## 2019-06-15 ENCOUNTER — Encounter: Payer: Self-pay | Admitting: Neurology

## 2019-06-17 ENCOUNTER — Ambulatory Visit: Payer: 59 | Admitting: Adult Health

## 2019-06-17 ENCOUNTER — Other Ambulatory Visit: Payer: Self-pay

## 2019-06-17 ENCOUNTER — Encounter: Payer: Self-pay | Admitting: Adult Health

## 2019-06-17 VITALS — BP 124/88 | HR 84 | Temp 97.4°F | Ht 63.0 in | Wt 185.6 lb

## 2019-06-17 DIAGNOSIS — Z9989 Dependence on other enabling machines and devices: Secondary | ICD-10-CM | POA: Diagnosis not present

## 2019-06-17 DIAGNOSIS — G4733 Obstructive sleep apnea (adult) (pediatric): Secondary | ICD-10-CM | POA: Diagnosis not present

## 2019-06-17 NOTE — Patient Instructions (Signed)
Continue using CPAP nightly and greater than 4 hours each night °If your symptoms worsen or you develop new symptoms please let us know.  ° °

## 2019-06-17 NOTE — Progress Notes (Signed)
PATIENT: Ricky Diaz DOB: 1975/10/27  REASON FOR VISIT: follow up HISTORY FROM: patient  HISTORY OF PRESENT ILLNESS: Today 06/17/19:  [name] is a 43 year old male with a history of obstructive sleep apnea on CPAP.  His download indicates that he use his machine 30 out of 30 days for compliance of 100%.  He uses machine greater than 4 hours each night.  On average he uses his machine 9 hours and 8 minutes.  His residual AHI is 0.4 on 12 cm of water with EPR of 2.  His leak in the 95th percentile is 29.2 L/min.  He states that he does not feel the mask leaking at night.  He still gets good benefit with the CPAP.  He returns today for an evaluation.  HISTORY 11/27/18:  [name] is a 43 year old male with a history of obstructive sleep apnea on CPAP.  He returns today for a virtual visit.  His download indicates that he uses machine 30 out of 30 days for compliance of 100%.  He uses machine greater than 4 hours 29 out of 30 days for compliance of 97%.  On average he uses his machine 9 hours and 11 minutes.  His residual AHI is 0.4 on 12 cm of water.  He does have a significant leak in the 95th percentile at 78.3 L/min.  The patient notes that he continues to get good benefit with his CPAP.  REVIEW OF SYSTEMS: Out of a complete 14 system review of symptoms, the patient complains only of the following symptoms, and all other reviewed systems are negative.  ESS 1 FSS12  ALLERGIES: Allergies  Allergen Reactions  . Other     HOME MEDICATIONS: Outpatient Medications Prior to Visit  Medication Sig Dispense Refill  . amLODipine (NORVASC) 10 MG tablet Take 1 tablet (10 mg total) by mouth daily. 90 tablet 3  . labetalol (NORMODYNE) 200 MG tablet Take 1 tablet (200 mg total) by mouth 2 (two) times daily. 180 tablet 3  . lisinopril (ZESTRIL) 10 MG tablet TAKE 1 TABLET BY MOUTH EVERY DAY 90 tablet 3  . spironolactone (ALDACTONE) 25 MG tablet Take 1 tablet (25 mg total) by mouth  daily. 90 tablet 3   No facility-administered medications prior to visit.     PAST MEDICAL HISTORY: Past Medical History:  Diagnosis Date  . Glucose intolerance (impaired glucose tolerance)   . Hyperlipidemia   . Hypertension    onset age 89.  . OSA on CPAP 07/23/2012  . Sleep difficulties     PAST SURGICAL HISTORY: Past Surgical History:  Procedure Laterality Date  . NO PAST SURGERIES      FAMILY HISTORY: Family History  Problem Relation Age of Onset  . Hypertension Brother   . Sleep apnea Brother   . Sleep apnea Brother     SOCIAL HISTORY: Social History   Socioeconomic History  . Marital status: Married    Spouse name: [name]  . Number of children: 4  . Years of education: 87  . Highest education level: Not on file  Occupational History    Employer: [employer]  Social Needs  . Financial resource strain: Not hard at all  . Food insecurity    Worry: Never true    Inability: Never true  . Transportation needs    Medical: No    Non-medical: No  Tobacco Use  . Smoking status: Never Smoker  . Smokeless tobacco: Never Used  Substance and Sexual Activity  .  Alcohol use: Yes    Alcohol/week: 0.0 standard drinks    Comment: rare  . Drug use: No  . Sexual activity: Yes    Partners: Female  Lifestyle  . Physical activity    Days per week: 4 days    Minutes per session: 30 min  . Stress: Only a little  Relationships  . Social Musician on phone: Three times a week    Gets together: Twice a week    Attends religious service: Patient refused    Active member of club or organization: Patient refused    Attends meetings of clubs or organizations: Patient refused    Relationship status: Married  . Intimate partner violence    Fear of current or ex partner: No    Emotionally abused: No    Physically abused: No    Forced sexual activity: No  Other Topics Concern  . Not on file  Social History Narrative   Marital status:  married x 2007. From [place]; [place] since 2000.      Children: 4 children; no grandchildren.      Lives: with wife ([name]) , children.      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      Tobacco: none      Alcohol: none      Drugs: none      Exercise:  Sporadic.   Patient is right-handed.   Patient has a college education.   Patient drinks very little caffeine.      PHYSICAL EXAM  Vitals:   06/17/19 0833  BP: 124/88  Pulse: 84  Temp: (!) 97.4 F (36.3 C)  Weight: 185 lb 9.6 oz (84.2 kg)  Height: 5\' 3"  (1.6 m)   Body mass index is 32.88 kg/m.  Generalized: Well developed, in no acute distress  Chest: Lungs clear to auscultation bilaterally  Neurological examination  Mentation: Alert oriented to time, place, history taking. Follows all commands speech and language fluent Cranial nerve II-XII: Extraocular movements were full, visual field were full on confrontational test Head turning and shoulder shrug  were normal and symmetric. Motor: The motor testing reveals 5 over 5 strength of all 4 extremities. Good symmetric motor tone is noted throughout.  Sensory: Sensory testing is intact to soft touch on all 4 extremities. No evidence of extinction is noted.  Gait and station: Gait is normal.    DIAGNOSTIC DATA (LABS, IMAGING, TESTING) - I reviewed patient records, labs, notes, testing and imaging myself where available.  Lab Results  Component Value Date   WBC 2.9 (L) 04/06/2019   HGB 13.5 04/06/2019   HCT 40.3 04/06/2019   MCV 90 04/06/2019   PLT 191 04/06/2019      Component Value Date/Time   NA 140 04/06/2019 1129   K 4.7 04/06/2019 1129   CL 103 04/06/2019 1129   CO2 21 04/06/2019 1129   GLUCOSE 137 (H) 04/06/2019 1129   GLUCOSE 111 (H) 04/02/2016 0958   BUN 11 04/06/2019 1129   CREATININE 1.09 04/06/2019 1129   CREATININE 1.14 04/02/2016 0958   CALCIUM 10.0 04/06/2019 1129   PROT 7.2 04/06/2019 1129   ALBUMIN 4.8 04/06/2019 1129   AST 21  04/06/2019 1129   ALT 32 04/06/2019 1129   ALKPHOS 63 04/06/2019 1129   BILITOT 0.2 04/06/2019 1129   GFRNONAA 82 04/06/2019 1129   GFRNONAA 79 04/02/2016 0958   GFRAA 94 04/06/2019 1129   GFRAA >89 04/02/2016 06/02/2016  Lab Results  Component Value Date   CHOL 149 04/06/2019   HDL 38 (L) 04/06/2019   LDLCALC 100 (H) 04/06/2019   TRIG 52 04/06/2019   CHOLHDL 3.9 04/06/2019   Lab Results  Component Value Date   HGBA1C 6.7 (H) 04/06/2019   Lab Results  Component Value Date   VITAMINB12 1,027 (H) 08/20/2015   Lab Results  Component Value Date   TSH 0.795 08/20/2015      ASSESSMENT AND PLAN 43 y.o. year old male  has a past medical history of Glucose intolerance (impaired glucose tolerance), Hyperlipidemia, Hypertension, OSA on CPAP (07/23/2012), and Sleep difficulties. here with:  1. Obstructive sleep apnea on CPAP  The patient's CPAP download shows excellent compliance and good treatment of his apnea.  He is encouraged to continue using CPAP nightly and greater than 4 hours each night.  He is advised that if his symptoms worsen or he develops new symptoms he should let us know.  He will follow-up in 1 year or sooner if needed   I spent 15 minutes with the patient. 50% of this time was spent   Butch PennyMegan Semiyah Newgent, MSN, NP-C 06/17/2019, 8:37 AM [facility] [address], [address] [place], [place] [address] [phone]

## 2019-11-27 IMAGING — DX RIGHT SHOULDER - 2+ VIEW
3 series · 3 of 3 positions shown · non-contrast
Comparison: None.

CLINICAL DATA: 42 y/o M; fall with injury to the proximal lateral
region of the right humerus.

EXAM:
RIGHT SHOULDER - 2+ VIEW

[shoulder grashey]
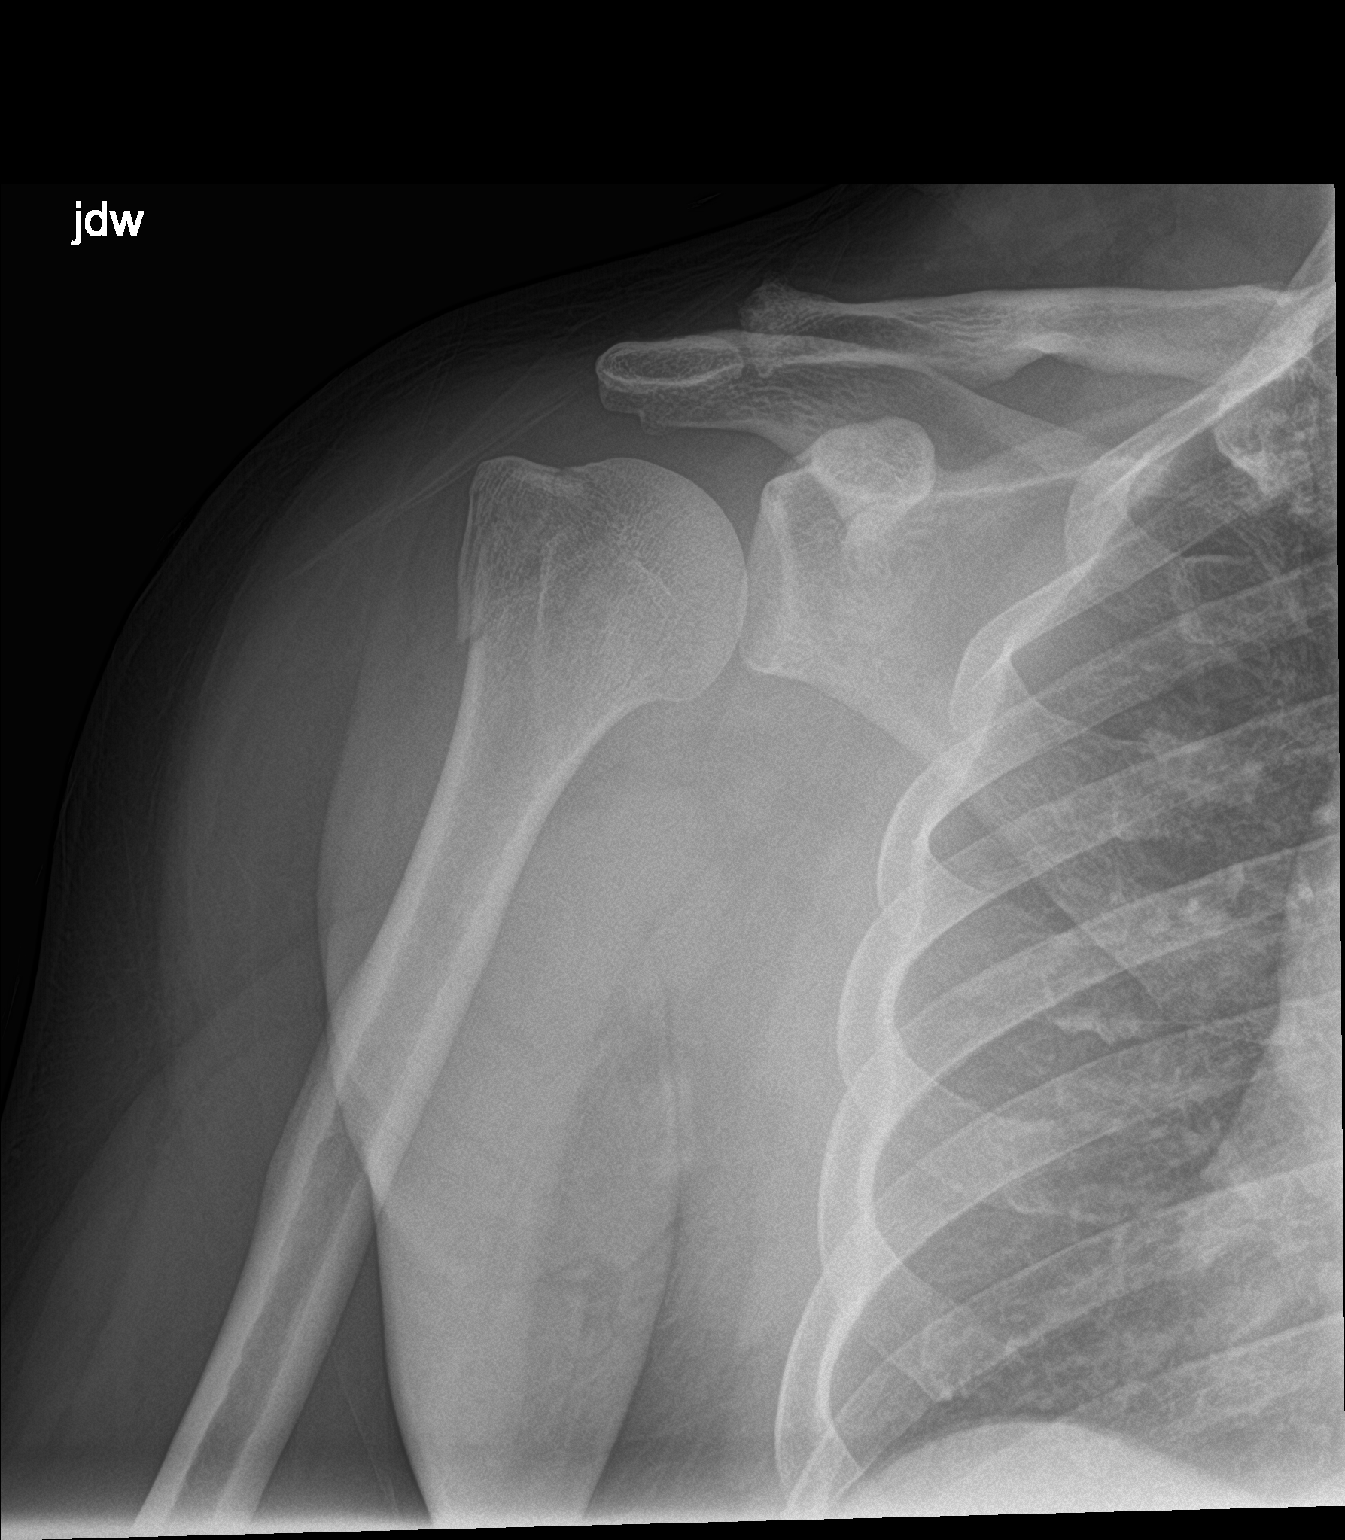

[shoulder y view]
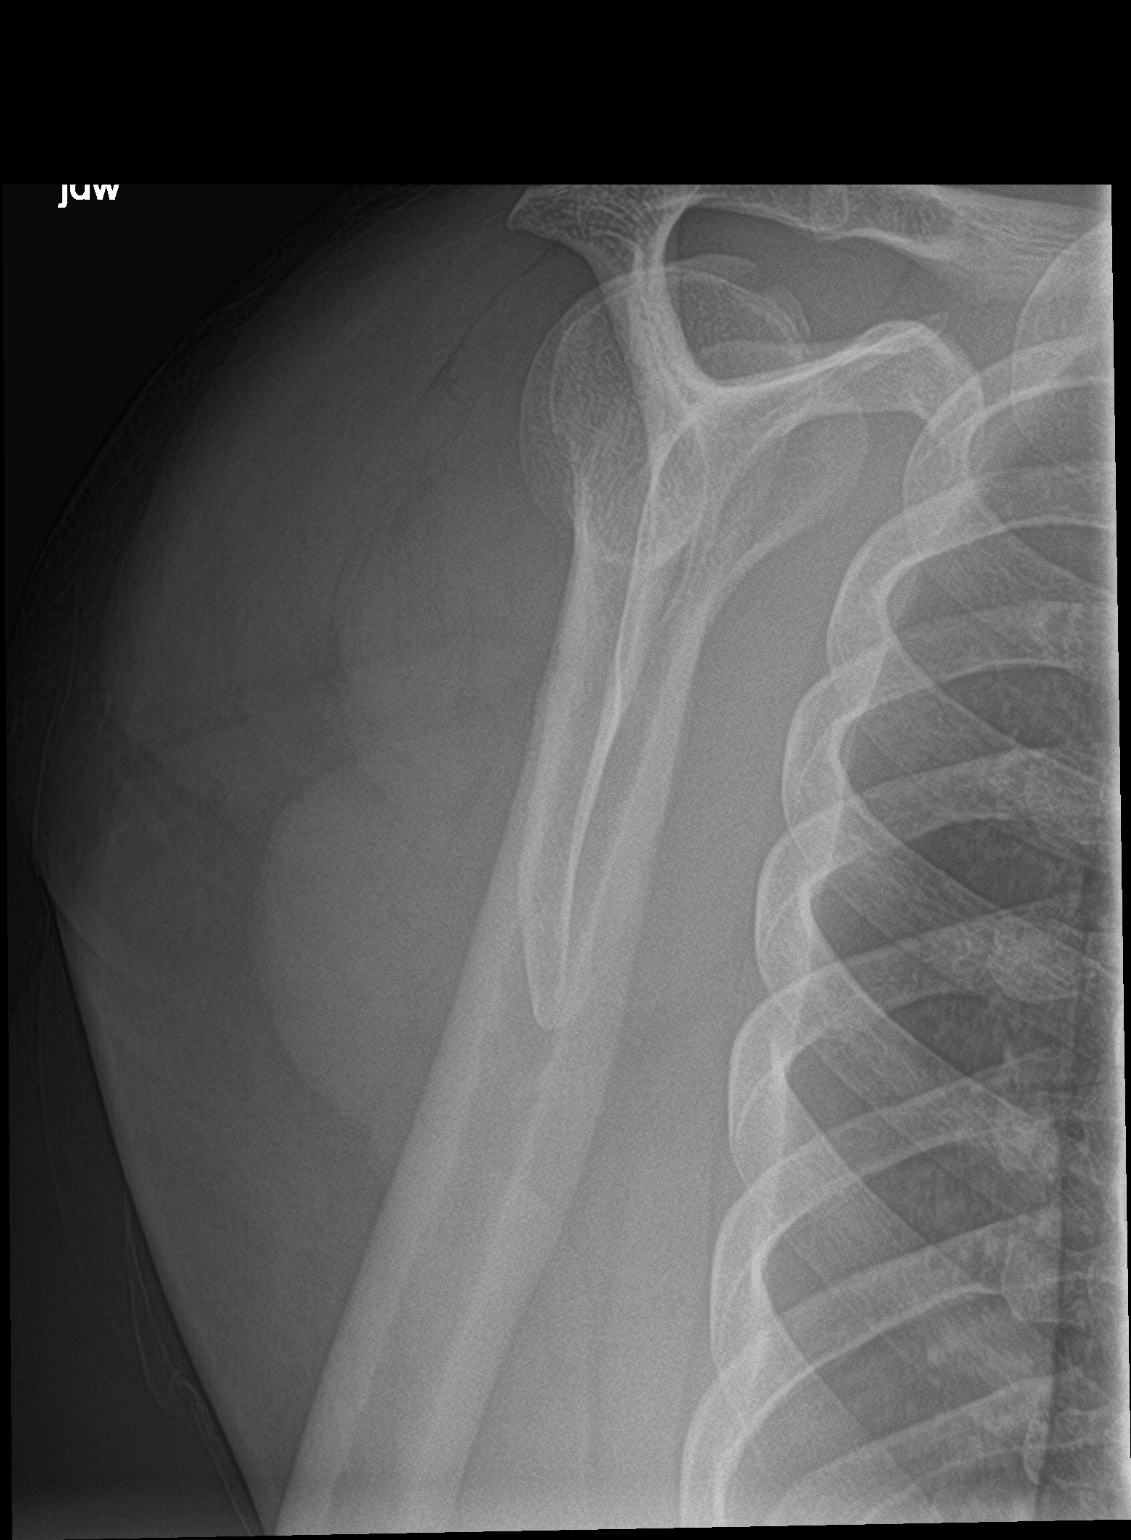

[shoulder ap neutral]
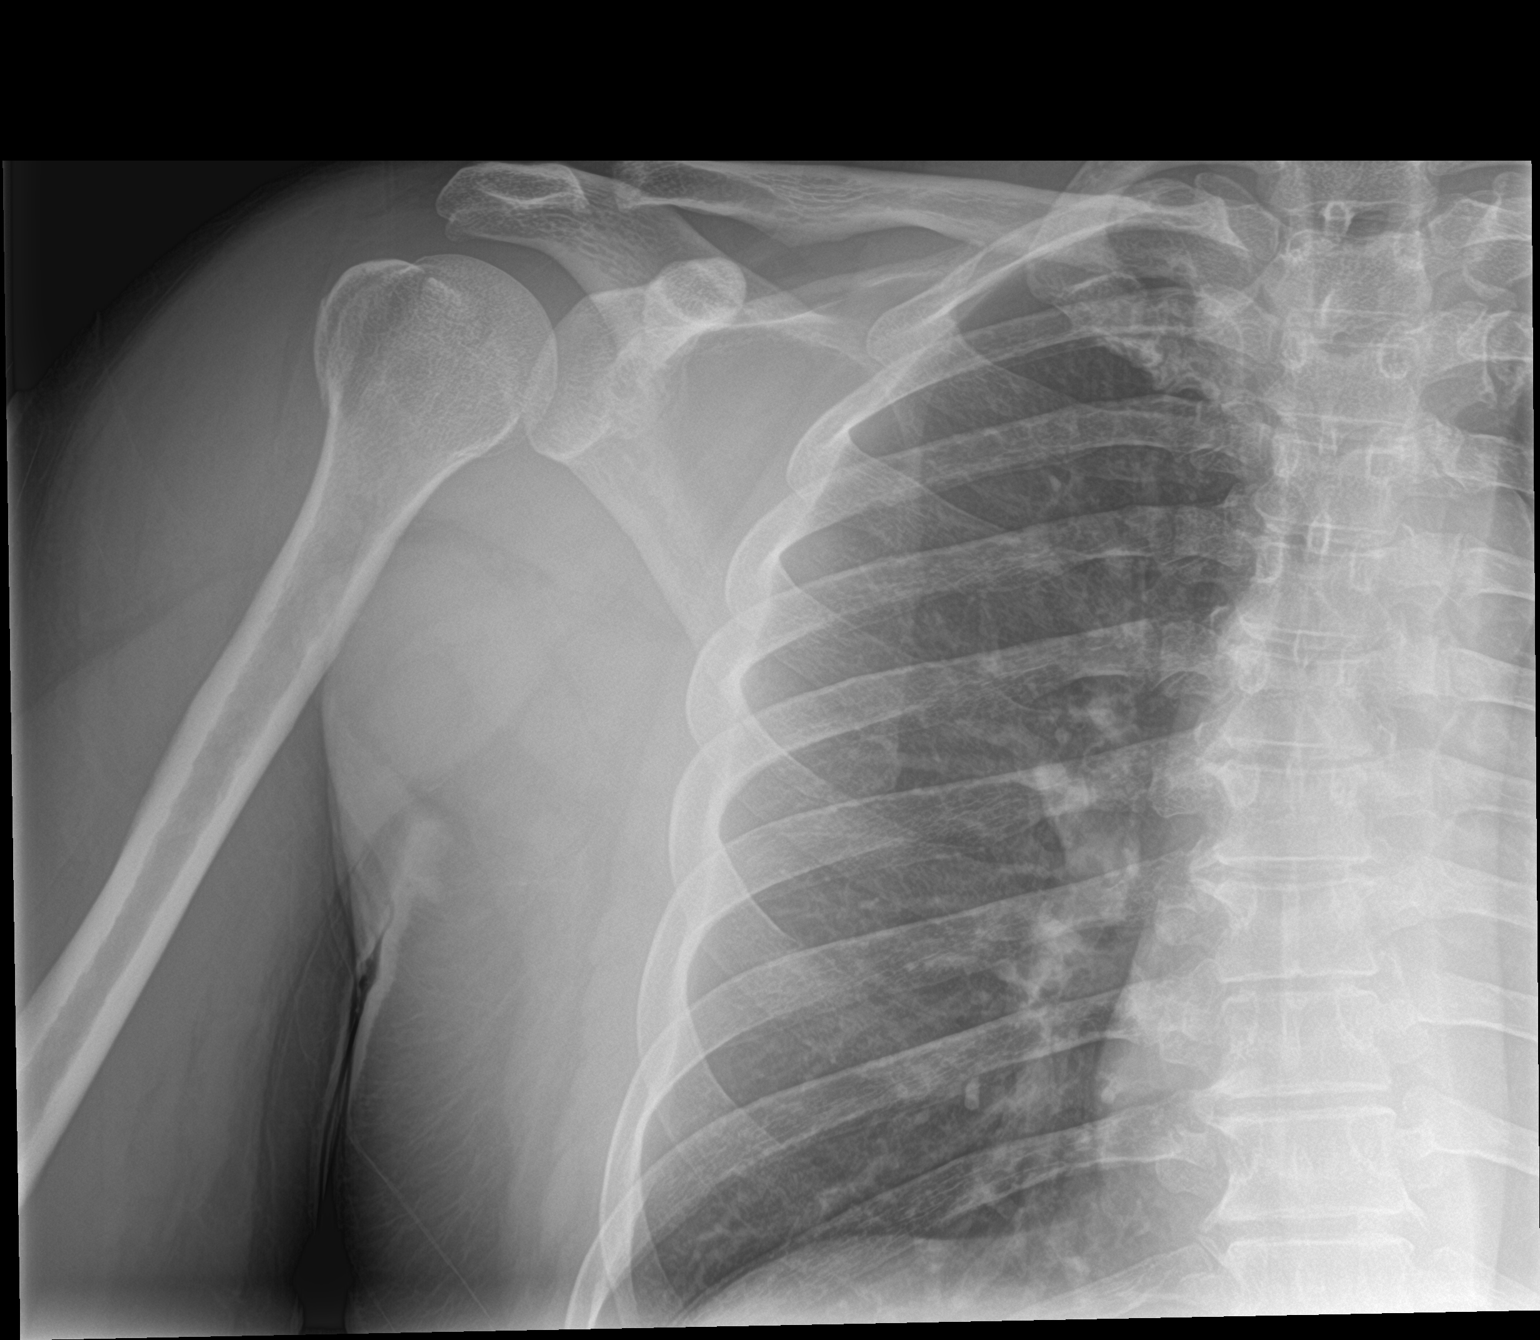

[3 of 3 positions shown; findings below may reference images not displayed]

FINDINGS: No shoulder dislocation. Acute minimally displaced fracture of the
right proximal humerus greater tubercle. Acromioclavicular and
coracoclavicular intervals are within normal limits.
IMPRESSION: Acute minimally displaced fracture of the right proximal humerus
greater tubercle.

## 2020-01-26 ENCOUNTER — Other Ambulatory Visit: Payer: Self-pay | Admitting: Registered Nurse

## 2020-01-26 DIAGNOSIS — I10 Essential (primary) hypertension: Secondary | ICD-10-CM

## 2020-03-15 ENCOUNTER — Other Ambulatory Visit: Payer: Self-pay | Admitting: Registered Nurse

## 2020-03-15 DIAGNOSIS — I10 Essential (primary) hypertension: Secondary | ICD-10-CM

## 2020-03-25 ENCOUNTER — Other Ambulatory Visit: Payer: Self-pay | Admitting: Registered Nurse

## 2020-03-25 DIAGNOSIS — I10 Essential (primary) hypertension: Secondary | ICD-10-CM

## 2020-04-07 ENCOUNTER — Other Ambulatory Visit: Payer: Self-pay | Admitting: Registered Nurse

## 2020-04-07 DIAGNOSIS — I10 Essential (primary) hypertension: Secondary | ICD-10-CM

## 2020-04-08 ENCOUNTER — Encounter: Payer: Self-pay | Admitting: Registered Nurse

## 2020-04-08 ENCOUNTER — Other Ambulatory Visit: Payer: Self-pay

## 2020-04-08 ENCOUNTER — Ambulatory Visit (INDEPENDENT_AMBULATORY_CARE_PROVIDER_SITE_OTHER): Payer: 59 | Admitting: Registered Nurse

## 2020-04-08 VITALS — BP 129/82 | HR 72 | Temp 98.0°F | Resp 18 | Ht 63.0 in | Wt 192.0 lb

## 2020-04-08 DIAGNOSIS — I10 Essential (primary) hypertension: Secondary | ICD-10-CM

## 2020-04-08 DIAGNOSIS — Z0000 Encounter for general adult medical examination without abnormal findings: Secondary | ICD-10-CM

## 2020-04-08 DIAGNOSIS — Z0001 Encounter for general adult medical examination with abnormal findings: Secondary | ICD-10-CM | POA: Diagnosis not present

## 2020-04-08 DIAGNOSIS — Z23 Encounter for immunization: Secondary | ICD-10-CM

## 2020-04-08 MED ORDER — LABETALOL HCL 200 MG PO TABS: 200.0000 mg | ORAL_TABLET | Freq: Two times a day (BID) | ORAL | 3 refills | Status: DC

## 2020-04-08 MED ORDER — LABETALOL HCL 200 MG PO TABS: 200.0000 mg | ORAL_TABLET | Freq: Every day | ORAL | 3 refills | Status: DC

## 2020-04-08 MED ORDER — SPIRONOLACTONE 25 MG PO TABS: 25.0000 mg | ORAL_TABLET | Freq: Every day | ORAL | 3 refills | Status: DC

## 2020-04-08 MED ORDER — AMLODIPINE BESYLATE 10 MG PO TABS: 10.0000 mg | ORAL_TABLET | Freq: Every day | ORAL | 3 refills | Status: DC

## 2020-04-08 MED ORDER — LISINOPRIL 10 MG PO TABS: 10.0000 mg | ORAL_TABLET | Freq: Every day | ORAL | 3 refills | Status: DC

## 2020-04-08 NOTE — Patient Instructions (Addendum)
   If you have lab work done today you will be contacted with your lab results within the next 2 weeks.  If you have not heard from us then please contact us. The fastest way to get your results is to register for My Chart.   IF you received an x-ray today, you will receive an invoice from [facility]. Please contact [facility] at [phone] with questions or concerns regarding your invoice.   IF you received labwork today, you will receive an invoice from [facility]. Please contact [facility] at [phone] with questions or concerns regarding your invoice.   Our billing staff will not be able to assist you with questions regarding bills from these companies.  You will be contacted with the lab results as soon as they are available. The fastest way to get your results is to activate your My Chart account. Instructions are located on the last page of this paperwork. If you have not heard from us regarding the results in 2 weeks, please contact this office.       Health Maintenance, Male Adopting a healthy lifestyle and getting preventive care are important in promoting health and wellness. Ask your health care provider about:  The right schedule for you to have regular tests and exams.  Things you can do on your own to prevent diseases and keep yourself healthy. What should I know about diet, weight, and exercise? Eat a healthy diet   Eat a diet that includes plenty of vegetables, fruits, low-fat dairy products, and lean protein.  Do not eat a lot of foods that are high in solid fats, added sugars, or sodium. Maintain a healthy weight Body mass index (BMI) is a measurement that can be used to identify possible weight problems. It estimates body fat based on height and weight. Your health care provider can help determine your BMI and help you achieve or maintain a healthy weight. Get regular exercise Get regular exercise. This is one of the most important things  you can do for your health. Most adults should:  Exercise for at least 150 minutes each week. The exercise should increase your heart rate and make you sweat (moderate-intensity exercise).  Do strengthening exercises at least twice a week. This is in addition to the moderate-intensity exercise.  Spend less time sitting. Even light physical activity can be beneficial. Watch cholesterol and blood lipids Have your blood tested for lipids and cholesterol at 46 years of age, then have this test every 5 years. You may need to have your cholesterol levels checked more often if:  Your lipid or cholesterol levels are high.  You are older than 46 years of age.  You are at high risk for heart disease. What should I know about cancer screening? Many types of cancers can be detected early and may often be prevented. Depending on your health history and family history, you may need to have cancer screening at various ages. This may include screening for:  Colorectal cancer.  Prostate cancer.  Skin cancer.  Lung cancer. What should I know about heart disease, diabetes, and high blood pressure? Blood pressure and heart disease  High blood pressure causes heart disease and increases the risk of stroke. This is more likely to develop in people who have high blood pressure readings, are of African descent, or are overweight.  Talk with your health care provider about your target blood pressure readings.  Have your blood pressure checked: ? Every 3-5 years if you are   18-39 years of age. ? Every year if you are 40 years old or older.  If you are between the ages of 65 and 75 and are a current or former smoker, ask your health care provider if you should have a one-time screening for abdominal aortic aneurysm (AAA). Diabetes Have regular diabetes screenings. This checks your fasting blood sugar level. Have the screening done:  Once every three years after age 45 if you are at a normal weight and  have a low risk for diabetes.  More often and at a younger age if you are overweight or have a high risk for diabetes. What should I know about preventing infection? Hepatitis B If you have a higher risk for hepatitis B, you should be screened for this virus. Talk with your health care provider to find out if you are at risk for hepatitis B infection. Hepatitis C Blood testing is recommended for:  Everyone born from 1945 through 1965.  Anyone with known risk factors for hepatitis C. Sexually transmitted infections (STIs)  You should be screened each year for STIs, including gonorrhea and chlamydia, if: ? You are sexually active and are younger than 46 years of age. ? You are older than 46 years of age and your health care provider tells you that you are at risk for this type of infection. ? Your sexual activity has changed since you were last screened, and you are at increased risk for chlamydia or gonorrhea. Ask your health care provider if you are at risk.  Ask your health care provider about whether you are at high risk for HIV. Your health care provider may recommend a prescription medicine to help prevent HIV infection. If you choose to take medicine to prevent HIV, you should first get tested for HIV. You should then be tested every 3 months for as long as you are taking the medicine. Follow these instructions at home: Lifestyle  Do not use any products that contain nicotine or tobacco, such as cigarettes, e-cigarettes, and chewing tobacco. If you need help quitting, ask your health care provider.  Do not use street drugs.  Do not share needles.  Ask your health care provider for help if you need support or information about quitting drugs. Alcohol use  Do not drink alcohol if your health care provider tells you not to drink.  If you drink alcohol: ? Limit how much you have to 0-2 drinks a day. ? Be aware of how much alcohol is in your drink. In the [place] one drink equals  one 12 oz bottle of beer (355 mL), one 5 oz glass of wine (148 mL), or one 1 oz glass of hard liquor (44 mL). General instructions  Schedule regular health, dental, and eye exams.  Stay current with your vaccines.  Tell your health care provider if: ? You often feel depressed. ? You have ever been abused or do not feel safe at home. Summary  Adopting a healthy lifestyle and getting preventive care are important in promoting health and wellness.  Follow your health care provider's instructions about healthy diet, exercising, and getting tested or screened for diseases.  Follow your health care provider's instructions on monitoring your cholesterol and blood pressure. This information is not intended to replace advice given to you by your health care provider. Make sure you discuss any questions you have with your health care provider. Document Revised: 07/02/2018 Document Reviewed: 07/02/2018 Elsevier Patient Education  2020 [employer] Inc.     Why   follow it? Research shows. . Those who follow the Mediterranean diet have a reduced risk of heart disease  . The diet is associated with a reduced incidence of Parkinson's and Alzheimer's diseases . People following the diet may have longer life expectancies and lower rates of chronic diseases  . The Dietary Guidelines for Americans recommends the Mediterranean diet as an eating plan to promote health and prevent disease  What Is the Mediterranean Diet?  . Healthy eating plan based on typical foods and recipes of Mediterranean-style cooking . The diet is primarily a plant based diet; these foods should make up a majority of meals   Starches - Plant based foods should make up a majority of meals - They are an important sources of vitamins, minerals, energy, antioxidants, and fiber - Choose whole grains, foods high in fiber and minimally processed items  - Typical grain sources include wheat, oats, barley, corn, brown rice, bulgar, farro,  millet, polenta, couscous  - Various types of beans include chickpeas, lentils, fava beans, black beans, white beans   Fruits  Veggies - Large quantities of antioxidant rich fruits & veggies; 6 or more servings  - Vegetables can be eaten raw or lightly drizzled with oil and cooked  - Vegetables common to the traditional Mediterranean Diet include: artichokes, arugula, beets, broccoli, brussel sprouts, cabbage, carrots, celery, collard greens, cucumbers, eggplant, kale, leeks, lemons, lettuce, mushrooms, okra, onions, peas, peppers, potatoes, pumpkin, radishes, rutabaga, shallots, spinach, sweet potatoes, turnips, zucchini - Fruits common to the Mediterranean Diet include: apples, apricots, avocados, cherries, clementines, dates, figs, grapefruits, grapes, melons, nectarines, oranges, peaches, pears, pomegranates, strawberries, tangerines  Fats - Replace butter and margarine with healthy oils, such as olive oil, canola oil, and tahini  - Limit nuts to no more than a handful a day  - Nuts include walnuts, almonds, pecans, pistachios, pine nuts  - Limit or avoid candied, honey roasted or heavily salted nuts - Olives are central to the Mediterranean diet - can be eaten whole or used in a variety of dishes   Meats Protein - Limiting red meat: no more than a few times a month - When eating red meat: choose lean cuts and keep the portion to the size of deck of cards - Eggs: approx. 0 to 4 times a week  - Fish and lean poultry: at least 2 a week  - Healthy protein sources include, chicken, turkey, lean beef, lamb - Increase intake of seafood such as tuna, salmon, trout, mackerel, shrimp, scallops - Avoid or limit high fat processed meats such as sausage and bacon  Dairy - Include moderate amounts of low fat dairy products  - Focus on healthy dairy such as fat free yogurt, skim milk, low or reduced fat cheese - Limit dairy products higher in fat such as whole or 2% milk, cheese, ice cream  Alcohol -  Moderate amounts of red wine is ok  - No more than 5 oz daily for women (all ages) and men older than age 65  - No more than 10 oz of wine daily for men younger than 65  Other - Limit sweets and other desserts  - Use herbs and spices instead of salt to flavor foods  - Herbs and spices common to the traditional Mediterranean Diet include: basil, bay leaves, chives, cloves, cumin, fennel, garlic, lavender, marjoram, mint, oregano, parsley, pepper, rosemary, sage, savory, sumac, tarragon, thyme   It's not just a diet, it's a lifestyle:  . The Mediterranean diet includes   lifestyle factors typical of those in the region  . Foods, drinks and meals are best eaten with others and savored . Daily physical activity is important for overall good health . This could be strenuous exercise like running and aerobics . This could also be more leisurely activities such as walking, housework, yard-work, or taking the stairs . Moderation is the key; a balanced and healthy diet accommodates most foods and drinks . Consider portion sizes and frequency of consumption of certain foods   Meal Ideas & Options:  . Breakfast:  o Whole wheat toast or whole wheat English muffins with peanut butter & hard boiled egg o Steel cut oats topped with apples & cinnamon and skim milk  o Fresh fruit: banana, strawberries, melon, berries, peaches  o Smoothies: strawberries, bananas, greek yogurt, peanut butter o Low fat greek yogurt with blueberries and granola  o Egg white omelet with spinach and mushrooms o Breakfast couscous: whole wheat couscous, apricots, skim milk, cranberries  . Sandwiches:  o Hummus and grilled vegetables (peppers, zucchini, squash) on whole wheat bread   o Grilled chicken on whole wheat pita with lettuce, tomatoes, cucumbers or tzatziki  o Tuna salad on whole wheat bread: tuna salad made with greek yogurt, olives, red peppers, capers, green onions o Garlic rosemary lamb pita: lamb sauted with garlic,  rosemary, salt & pepper; add lettuce, cucumber, greek yogurt to pita - flavor with lemon juice and black pepper  . Seafood:  o Mediterranean grilled salmon, seasoned with garlic, basil, parsley, lemon juice and black pepper o Shrimp, lemon, and spinach whole-grain pasta salad made with low fat greek yogurt  o Seared scallops with lemon orzo  o Seared tuna steaks seasoned salt, pepper, coriander topped with tomato mixture of olives, tomatoes, olive oil, minced garlic, parsley, green onions and cappers  . Meats:  o Herbed greek chicken salad with kalamata olives, cucumber, feta  o Red bell peppers stuffed with spinach, bulgur, lean ground beef (or lentils) & topped with feta   o Kebabs: skewers of chicken, tomatoes, onions, zucchini, squash  o Turkey burgers: made with red onions, mint, dill, lemon juice, feta cheese topped with roasted red peppers . Vegetarian o Cucumber salad: cucumbers, artichoke hearts, celery, red onion, feta cheese, tossed in olive oil & lemon juice  o Hummus and whole grain pita points with a greek salad (lettuce, tomato, feta, olives, cucumbers, red onion) o Lentil soup with celery, carrots made with vegetable broth, garlic, salt and pepper  o Tabouli salad: parsley, bulgur, mint, scallions, cucumbers, tomato, radishes, lemon juice, olive oil, salt and pepper.       Fat and Cholesterol Restricted Eating Plan Eating a diet that limits fat and cholesterol may help lower your risk for heart disease and other conditions. Your body needs fat and cholesterol for basic functions, but eating too much of these things can be harmful to your health. Your health care provider may order lab tests to check your blood fat (lipid) and cholesterol levels. This helps your health care provider understand your risk for certain conditions and whether you need to make diet changes. Work with your health care provider or dietitian to make an eating plan that is right for you. Your plan  includes:  Limit your fat intake to ______% or less of your total calories a day.  Limit your saturated fat intake to ______% or less of your total calories a day.  Limit the amount of cholesterol in your diet to less than   _________mg a day.  Eat ___________ g of fiber a day. What are tips for following this plan? General guidelines   If you are overweight, work with your health care provider to lose weight safely. Losing just 5-10% of your body weight can improve your overall health and help prevent diseases such as diabetes and heart disease.  Avoid: ? Foods with added sugar. ? Fried foods. ? Foods that contain partially hydrogenated oils, including stick margarine, some tub margarines, cookies, crackers, and other baked goods.  Limit alcohol intake to no more than 1 drink a day for nonpregnant women and 2 drinks a day for men. One drink equals 12 oz of beer, 5 oz of wine, or 1 oz of hard liquor. Reading food labels  Check food labels for: ? Trans fats, partially hydrogenated oils, or high amounts of saturated fat. Avoid foods that contain saturated fat and trans fat. ? The amount of cholesterol in each serving. Try to eat no more than 200 mg of cholesterol each day. ? The amount of fiber in each serving. Try to eat at least 20-30 g of fiber each day.  Choose foods with healthy fats, such as: ? Monounsaturated and polyunsaturated fats. These include olive and canola oil, flaxseeds, walnuts, almonds, and seeds. ? Omega-3 fats. These are found in foods such as salmon, mackerel, sardines, tuna, flaxseed oil, and ground flaxseeds.  Choose grain products that have whole grains. Look for the word "whole" as the first word in the ingredient list. Cooking  Cook foods using methods other than frying. Baking, boiling, grilling, and broiling are some healthy options.  Eat more home-cooked food and less restaurant, buffet, and fast food.  Avoid cooking using saturated fats. ? Animal  sources of saturated fats include meats, butter, and cream. ? Plant sources of saturated fats include palm oil, palm kernel oil, and coconut oil. Meal planning   At meals, imagine dividing your plate into fourths: ? Fill one-half of your plate with vegetables and green salads. ? Fill one-fourth of your plate with whole grains. ? Fill one-fourth of your plate with lean protein foods.  Eat fish that is high in omega-3 fats at least two times a week.  Eat more foods that contain fiber, such as whole grains, beans, apples, broccoli, carrots, peas, and barley. These foods help promote healthy cholesterol levels in the blood. Recommended foods Grains  Whole grains, such as whole wheat or whole grain breads, crackers, cereals, and pasta. Unsweetened oatmeal, bulgur, barley, quinoa, or brown rice. Corn or whole wheat flour tortillas. Vegetables  Fresh or frozen vegetables (raw, steamed, roasted, or grilled). Green salads. Fruits  All fresh, canned (in natural juice), or frozen fruits. Meats and other protein foods  Ground beef (85% or leaner), grass-fed beef, or beef trimmed of fat. Skinless chicken or turkey. Ground chicken or turkey. Pork trimmed of fat. All fish and seafood. Egg whites. Dried beans, peas, or lentils. Unsalted nuts or seeds. Unsalted canned beans. Natural nut butters without added sugar and oil. Dairy  Low-fat or nonfat dairy products, such as skim or 1% milk, 2% or reduced-fat cheeses, low-fat and fat-free ricotta or cottage cheese, or plain low-fat and nonfat yogurt. Fats and oils  Tub margarine without trans fats. Light or reduced-fat mayonnaise and salad dressings. Avocado. Olive, canola, sesame, or safflower oils. The items listed above may not be a complete list of recommended foods or beverages. Contact your dietitian for more options. Foods to avoid Grains  White bread. White   pasta. White rice. Cornbread. Bagels, pastries, and croissants. Crackers and snack  foods that contain trans fat and hydrogenated oils. Vegetables  Vegetables cooked in cheese, cream, or butter sauce. Fried vegetables. Fruits  Canned fruit in heavy syrup. Fruit in cream or butter sauce. Fried fruit. Meats and other protein foods  Fatty cuts of meat. Ribs, chicken wings, bacon, sausage, bologna, salami, chitterlings, fatback, hot dogs, bratwurst, and packaged lunch meats. Liver and organ meats. Whole eggs and egg yolks. Chicken and turkey with skin. Fried meat. Dairy  Whole or 2% milk, cream, half-and-half, and cream cheese. Whole milk cheeses. Whole-fat or sweetened yogurt. Full-fat cheeses. Nondairy creamers and whipped toppings. Processed cheese, cheese spreads, and cheese curds. Beverages  Alcohol. Sugar-sweetened drinks such as sodas, lemonade, and fruit drinks. Fats and oils  Butter, stick margarine, lard, shortening, ghee, or bacon fat. Coconut, palm kernel, and palm oils. Sweets and desserts  Corn syrup, sugars, honey, and molasses. Candy. Jam and jelly. Syrup. Sweetened cereals. Cookies, pies, cakes, donuts, muffins, and ice cream. The items listed above may not be a complete list of foods and beverages to avoid. Contact your dietitian for more information. Summary  Your body needs fat and cholesterol for basic functions. However, eating too much of these things can be harmful to your health.  Work with your health care provider and dietitian to follow a diet low in fat and cholesterol. Doing this may help lower your risk for heart disease and other conditions.  Choose healthy fats, such as monounsaturated and polyunsaturated fats, and foods high in omega-3 fatty acids.  Eat fiber-rich foods, such as whole grains, beans, peas, fruits, and vegetables.  Limit or avoid alcohol, fried foods, and foods high in saturated fats, partially hydrogenated oils, and sugar. This information is not intended to replace advice given to you by your health care provider. Make  sure you discuss any questions you have with your health care provider. Document Revised: 06/21/2017 Document Reviewed: 03/26/2017 Elsevier Patient Education  2020 [employer] Inc.  American Heart Association (AHA) Exercise Recommendation  Being physically active is important to prevent heart disease and stroke, the nation's No. 1and No. 5killers. To improve overall cardiovascular health, we suggest at least 150 minutes per week of moderate exercise or 75 minutes per week of vigorous exercise (or a combination of moderate and vigorous activity). Thirty minutes a day, five times a week is an easy goal to remember. You will also experience benefits even if you divide your time into two or three segments of 10 to 15 minutes per day.  For people who would benefit from lowering their blood pressure or cholesterol, we recommend 40 minutes of aerobic exercise of moderate to vigorous intensity three to four times a week to lower the risk for heart attack and stroke.  Physical activity is anything that makes you move your body and burn calories.  This includes things like climbing stairs or playing sports. Aerobic exercises benefit your heart, and include walking, jogging, swimming or biking. Strength and stretching exercises are best for overall stamina and flexibility.  The simplest, positive change you can make to effectively improve your heart health is to start walking. It's enjoyable, free, easy, social and great exercise. A walking program is flexible and boasts high success rates because people can stick with it. It's easy for walking to become a regular and satisfying part of life.   For Overall Cardiovascular Health:  At least 30 minutes of moderate-intensity aerobic activity at least 5 days   per week for a total of 150  OR   At least 25 minutes of vigorous aerobic activity at least 3 days per week for a total of 75 minutes; or a combination of moderate- and vigorous-intensity aerobic  activity  AND   Moderate- to high-intensity muscle-strengthening activity at least 2 days per week for additional health benefits.  For Lowering Blood Pressure and Cholesterol  An average 40 minutes of moderate- to vigorous-intensity aerobic activity 3 or 4 times per week  What if I can't make it to the time goal? Something is always better than nothing! And everyone has to start somewhere. Even if you've been sedentary for years, today is the day you can begin to make healthy changes in your life. If you don't think you'll make it for 30 or 40 minutes, set a reachable goal for today. You can work up toward your overall goal by increasing your time as you get stronger. Don't let all-or-nothing thinking rob you of doing what you can every day.  Source:http://www.heart.org    

## 2020-04-08 NOTE — Progress Notes (Signed)
Established Patient Office Visit  Subjective:  Patient ID: PARTICK MUSSELMAN, male    DOB: Sep 15, 1975  Age: 44 y.o. MRN: [id number]  CC:  Chief Complaint  Patient presents with  . Annual Exam    PAtient states he is here for a CPE. Patient would like to also discuss a medication change.   . Medication Refill    on pended medications    HPI Elgie E Merolla presents for CPE and labs  HTN: taking meds as prescribed except labetalol - has been taking only once daily No cv symptoms. Does not check home bp. Feeling well overall.  No other concerns or complaints. Family is healthy. Up to date on DOT physical.  Past Medical History:  Diagnosis Date  . Glucose intolerance (impaired glucose tolerance)   . Hyperlipidemia   . Hypertension    onset age 36.  . OSA on CPAP 07/23/2012  . Sleep difficulties     Past Surgical History:  Procedure Laterality Date  . NO PAST SURGERIES      Family History  Problem Relation Age of Onset  . Hypertension Brother   . Sleep apnea Brother   . Sleep apnea Brother     Social History   Socioeconomic History  . Marital status: Married    Spouse name: [name]  . Number of children: 4  . Years of education: 21  . Highest education level: Not on file  Occupational History    Employer: [employer]  Tobacco Use  . Smoking status: Never Smoker  . Smokeless tobacco: Never Used  Vaping Use  . Vaping Use: Never used  Substance and Sexual Activity  . Alcohol use: Yes    Alcohol/week: 0.0 standard drinks    Comment: rare  . Drug use: No  . Sexual activity: Yes    Partners: Female  Other Topics Concern  . Not on file  Social History Narrative   Marital status: married x 2007. From [place]; [place] since 2000.      Children: 4 children; no grandchildren.      Lives: with wife ([name]) , children.      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      Tobacco: none      Alcohol: none      Drugs: none       Exercise:  Sporadic.   Patient is right-handed.   Patient has a college education.   Patient drinks very little caffeine.   Social Determinants of Health   Financial Resource Strain:   . Difficulty of Paying Living Expenses: Not on file  Food Insecurity:   . Worried About Programme researcher, broadcasting/film/video in the Last Year: Not on file  . Ran Out of Food in the Last Year: Not on file  Transportation Needs:   . Lack of Transportation (Medical): Not on file  . Lack of Transportation (Non-Medical): Not on file  Physical Activity:   . Days of Exercise per Week: Not on file  . Minutes of Exercise per Session: Not on file  Stress:   . Feeling of Stress : Not on file  Social Connections:   . Frequency of Communication with Friends and Family: Not on file  . Frequency of Social Gatherings with Friends and Family: Not on file  . Attends Religious Services: Not on file  . Active Member of Clubs or Organizations: Not on file  . Attends Banker Meetings: Not on file  .  Marital Status: Not on file  Intimate Partner Violence:   . Fear of Current or Ex-Partner: Not on file  . Emotionally Abused: Not on file  . Physically Abused: Not on file  . Sexually Abused: Not on file    Outpatient Medications Prior to Visit  Medication Sig Dispense Refill  . amLODipine (NORVASC) 10 MG tablet TAKE 1 TABLET BY MOUTH EVERY DAY 90 tablet 3  . labetalol (NORMODYNE) 200 MG tablet Take 1 tablet (200 mg total) by mouth 2 (two) times daily. 180 tablet 3  . lisinopril (ZESTRIL) 10 MG tablet TAKE 1 TABLET BY MOUTH EVERY DAY 90 tablet 3  . spironolactone (ALDACTONE) 25 MG tablet TAKE 1 TABLET BY MOUTH EVERY DAY 30 tablet 0   No facility-administered medications prior to visit.    Allergies  Allergen Reactions  . Other     ROS Review of Systems  Constitutional: Negative.   HENT: Negative.   Eyes: Negative.   Respiratory: Negative.   Cardiovascular: Negative.   Gastrointestinal: Negative.     Genitourinary: Negative.   Musculoskeletal: Negative.   Skin: Negative.   Neurological: Negative.   Psychiatric/Behavioral: Negative.       Objective:    Physical Exam Vitals and nursing note reviewed.  Constitutional:      General: He is not in acute distress.    Appearance: Normal appearance. He is normal weight. He is not ill-appearing, toxic-appearing or diaphoretic.  HENT:     Head: Normocephalic and atraumatic.     Right Ear: Tympanic membrane, ear canal and external ear normal. There is no impacted cerumen.     Left Ear: Tympanic membrane, ear canal and external ear normal. There is no impacted cerumen.     Nose: Nose normal. No congestion or rhinorrhea.     Mouth/Throat:     Mouth: Mucous membranes are moist.     Pharynx: Oropharynx is clear. No oropharyngeal exudate or posterior oropharyngeal erythema.  Eyes:     General: No scleral icterus.       Right eye: No discharge.        Left eye: No discharge.     Extraocular Movements: Extraocular movements intact.     Conjunctiva/sclera: Conjunctivae normal.     Pupils: Pupils are equal, round, and reactive to light.  Neck:     Vascular: No carotid bruit.  Cardiovascular:     Rate and Rhythm: Normal rate and regular rhythm.     Pulses: Normal pulses.     Heart sounds: Normal heart sounds. No murmur heard.  No friction rub. No gallop.   Pulmonary:     Effort: Pulmonary effort is normal. No respiratory distress.     Breath sounds: Normal breath sounds. No stridor. No wheezing, rhonchi or rales.  Chest:     Chest wall: No tenderness.  Abdominal:     General: Abdomen is flat. Bowel sounds are normal. There is no distension.     Palpations: Abdomen is soft. There is no mass.     Tenderness: There is no abdominal tenderness. There is no right CVA tenderness, left CVA tenderness, guarding or rebound.     Hernia: No hernia is present.  Musculoskeletal:        General: No swelling, tenderness, deformity or signs of injury.  Normal range of motion.     Cervical back: Normal range of motion and neck supple. No rigidity or tenderness.     Right lower leg: No edema.     Left lower  leg: No edema.  Lymphadenopathy:     Cervical: No cervical adenopathy.  Skin:    General: Skin is warm and dry.     Capillary Refill: Capillary refill takes less than 2 seconds.     Coloration: Skin is not jaundiced or pale.     Findings: No bruising, erythema, lesion or rash.  Neurological:     General: No focal deficit present.     Mental Status: He is alert and oriented to person, place, and time. Mental status is at baseline.     Cranial Nerves: No cranial nerve deficit.     Motor: No weakness.     Gait: Gait normal.  Psychiatric:        Mood and Affect: Mood normal.        Behavior: Behavior normal.        Thought Content: Thought content normal.        Judgment: Judgment normal.     BP 129/82   Pulse 72   Temp 98 F (36.7 C) (Temporal)   Resp 18   Ht 5\' 3"  (1.6 m)   Wt 192 lb (87.1 kg)   SpO2 96%   BMI 34.01 kg/m  Wt Readings from Last 3 Encounters:  04/08/20 192 lb (87.1 kg)  06/17/19 185 lb 9.6 oz (84.2 kg)  04/06/19 188 lb (85.3 kg)     Health Maintenance Due  Topic Date Due  . Hepatitis C Screening  Never done    There are no preventive care reminders to display for this patient.  Lab Results  Component Value Date   TSH 0.795 08/20/2015   Lab Results  Component Value Date   WBC 2.9 (L) 04/06/2019   HGB 13.5 04/06/2019   HCT 40.3 04/06/2019   MCV 90 04/06/2019   PLT 191 04/06/2019   Lab Results  Component Value Date   NA 140 04/06/2019   K 4.7 04/06/2019   CO2 21 04/06/2019   GLUCOSE 137 (H) 04/06/2019   BUN 11 04/06/2019   CREATININE 1.09 04/06/2019   BILITOT 0.2 04/06/2019   ALKPHOS 63 04/06/2019   AST 21 04/06/2019   ALT 32 04/06/2019   PROT 7.2 04/06/2019   ALBUMIN 4.8 04/06/2019   CALCIUM 10.0 04/06/2019   Lab Results  Component Value Date   CHOL 149 04/06/2019   Lab  Results  Component Value Date   HDL 38 (L) 04/06/2019   Lab Results  Component Value Date   LDLCALC 100 (H) 04/06/2019   Lab Results  Component Value Date   TRIG 52 04/06/2019   Lab Results  Component Value Date   CHOLHDL 3.9 04/06/2019   Lab Results  Component Value Date   HGBA1C 6.7 (H) 04/06/2019      Assessment & Plan:   Problem List Items Addressed This Visit      Cardiovascular and Mediastinum   HTN (hypertension)   Relevant Medications   amLODipine (NORVASC) 10 MG tablet   labetalol (NORMODYNE) 200 MG tablet   lisinopril (ZESTRIL) 10 MG tablet   spironolactone (ALDACTONE) 25 MG tablet    Other Visit Diagnoses    Needs flu shot    -  Primary   Relevant Orders   Flu Vaccine QUAD 6+ mos PF IM (Fluarix Quad PF) (Completed)   Routine general medical examination at a health care facility       Relevant Orders   Hemoglobin A1c   Comprehensive metabolic panel   CBC with Differential   TSH  Lipid panel   Hepatitis C antibody      Meds ordered this encounter  Medications  . amLODipine (NORVASC) 10 MG tablet    Sig: Take 1 tablet (10 mg total) by mouth daily.    Dispense:  90 tablet    Refill:  3    Order Specific Question:   Supervising Provider    Answer:   Neva Seat, JEFFREY R [[id number]]  . labetalol (NORMODYNE) 200 MG tablet    Sig: Take 1 tablet (200 mg total) by mouth 2 (two) times daily.    Dispense:  180 tablet    Refill:  3    Order Specific Question:   Supervising Provider    Answer:   Neva Seat, JEFFREY R [[id number]]  . lisinopril (ZESTRIL) 10 MG tablet    Sig: Take 1 tablet (10 mg total) by mouth daily.    Dispense:  90 tablet    Refill:  3    Order Specific Question:   Supervising Provider    Answer:   Neva Seat, JEFFREY R [[id number]]  . spironolactone (ALDACTONE) 25 MG tablet    Sig: Take 1 tablet (25 mg total) by mouth daily.    Dispense:  90 tablet    Refill:  3    Order Specific Question:   Supervising Provider    Answer:   Neva Seat, JEFFREY R [[id number]]     Follow-up: No follow-ups on file.   PLAN  Labs drawn, will follow up as warranted  Exam unremarkable  Continue decreased dose of labetalol  Patient encouraged to call clinic with any questions, comments, or concerns.  Janeece Agee, NP

## 2020-04-09 LAB — CBC WITH DIFFERENTIAL/PLATELET
Basophils Absolute: 0 x10E3/uL (ref 0.0–0.2)
Basos: 1 %
EOS (ABSOLUTE): 0.1 x10E3/uL (ref 0.0–0.4)
Eos: 1 %
Hematocrit: 39.9 % (ref 37.5–51.0)
Hemoglobin: 13.1 g/dL (ref 13.0–17.7)
Immature Grans (Abs): 0 x10E3/uL (ref 0.0–0.1)
Immature Granulocytes: 0 %
Lymphocytes Absolute: 1.6 x10E3/uL (ref 0.7–3.1)
Lymphs: 40 %
MCH: 30.2 pg (ref 26.6–33.0)
MCHC: 32.8 g/dL (ref 31.5–35.7)
MCV: 92 fL (ref 79–97)
Monocytes Absolute: 0.5 x10E3/uL (ref 0.1–0.9)
Monocytes: 12 %
Neutrophils Absolute: 1.8 x10E3/uL (ref 1.4–7.0)
Neutrophils: 46 %
Platelets: 162 x10E3/uL (ref 150–450)
RBC: 4.34 x10E6/uL (ref 4.14–5.80)
RDW: 13.1 % (ref 11.6–15.4)
WBC: 4 x10E3/uL (ref 3.4–10.8)

## 2020-04-09 LAB — COMPREHENSIVE METABOLIC PANEL WITH GFR
ALT: 35 IU/L (ref 0–44)
AST: 19 IU/L (ref 0–40)
Albumin/Globulin Ratio: 1.8 (ref 1.2–2.2)
Albumin: 4.7 g/dL (ref 4.0–5.0)
Alkaline Phosphatase: 62 IU/L (ref 44–121)
BUN/Creatinine Ratio: 13 (ref 9–20)
BUN: 15 mg/dL (ref 6–24)
Bilirubin Total: <0.2 mg/dL (ref 0.0–1.2)
CO2: 22 mmol/L (ref 20–29)
Calcium: 9.7 mg/dL (ref 8.7–10.2)
Chloride: 105 mmol/L (ref 96–106)
Creatinine, Ser: 1.17 mg/dL (ref 0.76–1.27)
GFR calc Af Amer: 86 mL/min/1.73 (ref >59)
GFR calc non Af Amer: 74 mL/min/1.73 (ref >59)
Globulin, Total: 2.6 g/dL (ref 1.5–4.5)
Glucose: 117 mg/dL — ABNORMAL HIGH (ref 65–99)
Potassium: 4.7 mmol/L (ref 3.5–5.2)
Sodium: 139 mmol/L (ref 134–144)
Total Protein: 7.3 g/dL (ref 6.0–8.5)

## 2020-04-09 LAB — HEPATITIS C ANTIBODY: Hep C Virus Ab: <0.1 {s_co_ratio} (ref 0.0–0.9)

## 2020-04-09 LAB — HEMOGLOBIN A1C
Est. average glucose Bld gHb Est-mCnc: 151 mg/dL
Hgb A1c MFr Bld: 6.9 % — ABNORMAL HIGH (ref 4.8–5.6)

## 2020-04-09 LAB — LIPID PANEL
Chol/HDL Ratio: 3.7 ratio (ref 0.0–5.0)
Cholesterol, Total: 177 mg/dL (ref 100–199)
HDL: 48 mg/dL (ref >39)
LDL Chol Calc (NIH): 119 mg/dL — ABNORMAL HIGH (ref 0–99)
Triglycerides: 50 mg/dL (ref 0–149)
VLDL Cholesterol Cal: 10 mg/dL (ref 5–40)

## 2020-04-09 LAB — TSH: TSH: 1.39 u[IU]/mL (ref 0.450–4.500)

## 2020-04-09 NOTE — Progress Notes (Signed)
If we could send a letter to the patient:  Sugars are still a little high, watch diet and exercise regularly. Otherwise no concerns on labs. Let's recheck your A1c in about 6 months.  Thank you  Jari Sportsman, NP

## 2020-04-11 ENCOUNTER — Encounter: Payer: Self-pay | Admitting: Radiology

## 2020-06-19 NOTE — Progress Notes (Signed)
PATIENT: Ricky Diaz DOB: 1975/10/04  REASON FOR VISIT: follow up HISTORY FROM: patient  HISTORY OF PRESENT ILLNESS: Today 06/19/20:  Ricky Diaz is a 44 year old male with a history of obstructive sleep apnea on CPAP.  His download indicates that he uses machine nightly for compliance of 100%.  He uses machine greater than 4 hours each night.  On average he uses his machine 9 hours and 58 minutes.  His residual AHI is 0.6 on 12 cm of water with EPR of 2.  Leak in the 95th percentile is 32.4 L/min.  He reports that the CPAP is working well for him.  He would like a new machine with a battery source if possible.  States that when he travels he often travels to places that he does not have a power source.  HISTORY 06/17/19:  Ricky Diaz is a 44 year old male with a history of obstructive sleep apnea on CPAP.  His download indicates that he use his machine 30 out of 30 days for compliance of 100%.  He uses machine greater than 4 hours each night.  On average he uses his machine 9 hours and 8 minutes.  His residual AHI is 0.4 on 12 cm of water with EPR of 2.  His leak in the 95th percentile is 29.2 L/min.  He states that he does not feel the mask leaking at night.  He still gets good benefit with the CPAP.  He returns today for an evaluation.  REVIEW OF SYSTEMS: Out of a complete 14 system review of symptoms, the patient complains only of the following symptoms, and all other reviewed systems are negative.  FSS 13 ESS 2  ALLERGIES: Allergies  Allergen Reactions  . Other     HOME MEDICATIONS: Outpatient Medications Prior to Visit  Medication Sig Dispense Refill  . amLODipine (NORVASC) 10 MG tablet Take 1 tablet (10 mg total) by mouth daily. 90 tablet 3  . labetalol (NORMODYNE) 200 MG tablet Take 1 tablet (200 mg total) by mouth daily. 90 tablet 3  . lisinopril (ZESTRIL) 10 MG tablet Take 1 tablet (10 mg total) by mouth daily. 90 tablet 3  . spironolactone (ALDACTONE) 25 MG  tablet Take 1 tablet (25 mg total) by mouth daily. 90 tablet 3   No facility-administered medications prior to visit.    PAST MEDICAL HISTORY: Past Medical History:  Diagnosis Date  . Glucose intolerance (impaired glucose tolerance)   . Hyperlipidemia   . Hypertension    onset age 74.  . OSA on CPAP 07/23/2012  . Sleep difficulties     PAST SURGICAL HISTORY: Past Surgical History:  Procedure Laterality Date  . NO PAST SURGERIES      FAMILY HISTORY: Family History  Problem Relation Age of Onset  . Hypertension Brother   . Sleep apnea Brother   . Sleep apnea Brother     SOCIAL HISTORY: Social History   Socioeconomic History  . Marital status: Married    Spouse name: Ricky Diaz  . Number of children: 4  . Years of education: 75  . Highest education level: Not on file  Occupational History    Employer: [employer]  Tobacco Use  . Smoking status: Never Smoker  . Smokeless tobacco: Never Used  Vaping Use  . Vaping Use: Never used  Substance and Sexual Activity  . Alcohol use: Yes    Alcohol/week: 0.0 standard drinks    Comment: rare  . Drug use: No  . Sexual activity:  Yes    Partners: Female  Other Topics Concern  . Not on file  Social History Narrative   Marital status: married x 2007. From [place]; [place] since 2000.      Children: 4 children; no grandchildren.      Lives: with wife (Ricky Diaz) , children.      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      Tobacco: none      Alcohol: none      Drugs: none      Exercise:  Sporadic.   Patient is right-handed.   Patient has a college education.   Patient drinks very little caffeine.   Social Determinants of Health   Financial Resource Strain:   . Difficulty of Paying Living Expenses: Not on file  Food Insecurity:   . Worried About Programme researcher, broadcasting/film/video in the Last Year: Not on file  . Ran Out of Food in the Last Year: Not on file  Transportation Needs:   . Lack of  Transportation (Medical): Not on file  . Lack of Transportation (Non-Medical): Not on file  Physical Activity:   . Days of Exercise per Week: Not on file  . Minutes of Exercise per Session: Not on file  Stress:   . Feeling of Stress : Not on file  Social Connections:   . Frequency of Communication with Friends and Family: Not on file  . Frequency of Social Gatherings with Friends and Family: Not on file  . Attends Religious Services: Not on file  . Active Member of Clubs or Organizations: Not on file  . Attends Banker Meetings: Not on file  . Marital Status: Not on file  Intimate Partner Violence:   . Fear of Current or Ex-Partner: Not on file  . Emotionally Abused: Not on file  . Physically Abused: Not on file  . Sexually Abused: Not on file      PHYSICAL EXAM  Vitals:   06/20/20 1014  BP: 128/78  Pulse: 81  Weight: 191 lb 8 oz (86.9 kg)  Height: 5\' 3"  (1.6 m)   Body mass index is 33.92 kg/m.  Generalized: Well developed, in no acute distress  Chest: Lungs clear to auscultation bilaterally  Neurological examination  Mentation: Alert oriented to time, place, history taking. Follows all commands speech and language fluent Cranial nerve II-XII: Extraocular movements were full, visual field were full on confrontational test Head turning and shoulder shrug  were normal and symmetric. Motor: The motor testing reveals 5 over 5 strength of all 4 extremities. Good symmetric motor tone is noted throughout.  Sensory: Sensory testing is intact to soft touch on all 4 extremities. No evidence of extinction is noted.  Gait and station: Gait is normal.    DIAGNOSTIC DATA (LABS, IMAGING, TESTING) - I reviewed patient records, labs, notes, testing and imaging myself where available.  Lab Results  Component Value Date   WBC 4.0 04/08/2020   HGB 13.1 04/08/2020   HCT 39.9 04/08/2020   MCV 92 04/08/2020   PLT 162 04/08/2020      Component Value Date/Time   NA 139  04/08/2020 0945   K 4.7 04/08/2020 0945   CL 105 04/08/2020 0945   CO2 22 04/08/2020 0945   GLUCOSE 117 (H) 04/08/2020 0945   GLUCOSE 111 (H) 04/02/2016 0958   BUN 15 04/08/2020 0945   CREATININE 1.17 04/08/2020 0945   CREATININE 1.14 04/02/2016 0958   CALCIUM 9.7 04/08/2020 0945  PROT 7.3 04/08/2020 0945   ALBUMIN 4.7 04/08/2020 0945   AST 19 04/08/2020 0945   ALT 35 04/08/2020 0945   ALKPHOS 62 04/08/2020 0945   BILITOT <0.2 04/08/2020 0945   GFRNONAA 74 04/08/2020 0945   GFRNONAA 79 04/02/2016 0958   GFRAA 86 04/08/2020 0945   GFRAA >89 04/02/2016 0958   Lab Results  Component Value Date   CHOL 177 04/08/2020   HDL 48 04/08/2020   LDLCALC 119 (H) 04/08/2020   TRIG 50 04/08/2020   CHOLHDL 3.7 04/08/2020   Lab Results  Component Value Date   HGBA1C 6.9 (H) 04/08/2020   Lab Results  Component Value Date   VITAMINB12 1,027 (H) 08/20/2015   Lab Results  Component Value Date   TSH 1.390 04/08/2020      ASSESSMENT AND PLAN 44 y.o. year old male  has a past medical history of Glucose intolerance (impaired glucose tolerance), Hyperlipidemia, Hypertension, OSA on CPAP (07/23/2012), and Sleep difficulties. here with:  1. OSA on CPAP  - CPAP compliance excellent - Good treatment of AHI  -Order sent in for new machine with a possible battery source if available - Encourage patient to use CPAP nightly and > 4 hours each night - F/U in 1 year or sooner if needed   I spent 25 minutes of face-to-face and non-face-to-face time with patient.  This included previsit chart review, lab review, study review, order entry, electronic health record documentation, patient education.  Butch Penny, MSN, NP-C 06/19/2020, 2:44 PM [facility] [address], [address] [place], [place] [address] [phone]

## 2020-06-20 ENCOUNTER — Ambulatory Visit: Payer: 59 | Admitting: Adult Health

## 2020-06-20 ENCOUNTER — Encounter: Payer: Self-pay | Admitting: Adult Health

## 2020-06-20 VITALS — BP 128/78 | HR 81 | Ht 63.0 in | Wt 191.5 lb

## 2020-06-20 DIAGNOSIS — G4733 Obstructive sleep apnea (adult) (pediatric): Secondary | ICD-10-CM | POA: Diagnosis not present

## 2020-06-20 DIAGNOSIS — Z9989 Dependence on other enabling machines and devices: Secondary | ICD-10-CM | POA: Diagnosis not present

## 2020-06-20 NOTE — Patient Instructions (Signed)
Continue using CPAP nightly and greater than 4 hours each night  Order sent for new machine If your symptoms worsen or you develop new symptoms please let us know.   

## 2021-02-03 ENCOUNTER — Other Ambulatory Visit: Payer: Self-pay | Admitting: Registered Nurse

## 2021-02-03 DIAGNOSIS — I10 Essential (primary) hypertension: Secondary | ICD-10-CM

## 2021-04-10 ENCOUNTER — Encounter: Payer: Self-pay | Admitting: Registered Nurse

## 2021-04-25 ENCOUNTER — Other Ambulatory Visit: Payer: Self-pay

## 2021-04-25 ENCOUNTER — Encounter: Payer: Self-pay | Admitting: Registered Nurse

## 2021-04-25 ENCOUNTER — Ambulatory Visit (INDEPENDENT_AMBULATORY_CARE_PROVIDER_SITE_OTHER): Payer: 59 | Admitting: Registered Nurse

## 2021-04-25 VITALS — BP 120/88 | HR 82 | Temp 98.3°F | Resp 18 | Ht 63.0 in | Wt 180.4 lb

## 2021-04-25 DIAGNOSIS — Z23 Encounter for immunization: Secondary | ICD-10-CM

## 2021-04-25 DIAGNOSIS — I10 Essential (primary) hypertension: Secondary | ICD-10-CM | POA: Diagnosis not present

## 2021-04-25 DIAGNOSIS — Z0000 Encounter for general adult medical examination without abnormal findings: Secondary | ICD-10-CM | POA: Diagnosis not present

## 2021-04-25 LAB — COMPREHENSIVE METABOLIC PANEL
ALT: 33 U/L (ref 0–53)
AST: 18 U/L (ref 0–37)
Albumin: 4.5 g/dL (ref 3.5–5.2)
Alkaline Phosphatase: 58 U/L (ref 39–117)
BUN: 19 mg/dL (ref 6–23)
CO2: 26 mEq/L (ref 19–32)
Calcium: 9.9 mg/dL (ref 8.4–10.5)
Chloride: 103 mEq/L (ref 96–112)
Creatinine, Ser: 1.05 mg/dL (ref 0.40–1.50)
GFR: 84.62 mL/min (ref >60.00)
Glucose, Bld: 129 mg/dL — ABNORMAL HIGH (ref 70–99)
Potassium: 4.3 mEq/L (ref 3.5–5.1)
Sodium: 138 mEq/L (ref 135–145)
Total Bilirubin: 0.4 mg/dL (ref 0.2–1.2)
Total Protein: 7.1 g/dL (ref 6.0–8.3)

## 2021-04-25 LAB — CBC WITH DIFFERENTIAL/PLATELET
Basophils Absolute: 0 10*3/uL (ref 0.0–0.1)
Basophils Relative: 0.7 % (ref 0.0–3.0)
Eosinophils Absolute: 0 10*3/uL (ref 0.0–0.7)
Eosinophils Relative: 0.7 % (ref 0.0–5.0)
HCT: 39.8 % (ref 39.0–52.0)
Hemoglobin: 13.3 g/dL (ref 13.0–17.0)
Lymphocytes Relative: 32.2 % (ref 12.0–46.0)
Lymphs Abs: 1.2 10*3/uL (ref 0.7–4.0)
MCHC: 33.3 g/dL (ref 30.0–36.0)
MCV: 91.2 fl (ref 78.0–100.0)
Monocytes Absolute: 0.5 10*3/uL (ref 0.1–1.0)
Monocytes Relative: 12.8 % — ABNORMAL HIGH (ref 3.0–12.0)
Neutro Abs: 2 10*3/uL (ref 1.4–7.7)
Neutrophils Relative %: 53.6 % (ref 43.0–77.0)
Platelets: 195 10*3/uL (ref 150.0–400.0)
RBC: 4.37 Mil/uL (ref 4.22–5.81)
RDW: 13.2 % (ref 11.5–15.5)
WBC: 3.8 10*3/uL — ABNORMAL LOW (ref 4.0–10.5)

## 2021-04-25 LAB — HEMOGLOBIN A1C: Hgb A1c MFr Bld: 11.1 % — ABNORMAL HIGH (ref 4.6–6.5)

## 2021-04-25 LAB — LIPID PANEL
Cholesterol: 169 mg/dL (ref 0–200)
HDL: 45.3 mg/dL (ref >39.00)
LDL Cholesterol: 105 mg/dL — ABNORMAL HIGH (ref 0–99)
NonHDL: 123.39
Total CHOL/HDL Ratio: 4
Triglycerides: 91 mg/dL (ref 0.0–149.0)
VLDL: 18.2 mg/dL (ref 0.0–40.0)

## 2021-04-25 LAB — TSH: TSH: 1.51 u[IU]/mL (ref 0.35–5.50)

## 2021-04-25 MED ORDER — LABETALOL HCL 200 MG PO TABS: 200.0000 mg | ORAL_TABLET | Freq: Every day | ORAL | 3 refills | Status: DC

## 2021-04-25 MED ORDER — LISINOPRIL 10 MG PO TABS: 10.0000 mg | ORAL_TABLET | Freq: Every day | ORAL | 3 refills | Status: DC

## 2021-04-25 MED ORDER — AMLODIPINE BESYLATE 10 MG PO TABS: 10.0000 mg | ORAL_TABLET | Freq: Every day | ORAL | 3 refills | Status: DC

## 2021-04-25 MED ORDER — SPIRONOLACTONE 25 MG PO TABS: 25.0000 mg | ORAL_TABLET | Freq: Every day | ORAL | 3 refills | Status: DC

## 2021-04-25 NOTE — Patient Instructions (Addendum)
Ricky Diaz -   Ricky Diaz to see you! No worries on exam.  Labs will be resulted by this afternoon.   I'll call with any urgent concerns.   Meds refilled for a year, see you then for a physical  Thanks,  [name]     If you have lab work done today you will be contacted with your lab results within the next 2 weeks.  If you have not heard from [place] then please contact us. The fastest way to get your results is to register for My Chart.   IF you received an x-ray today, you will receive an invoice from [facility]. Please contact [facility] at [phone] with questions or concerns regarding your invoice.   IF you received labwork today, you will receive an invoice from [facility]. Please contact [facility] at [phone] with questions or concerns regarding your invoice.   Our billing staff will not be able to assist you with questions regarding bills from these companies.  You will be contacted with the lab results as soon as they are available. The fastest way to get your results is to activate your My Chart account. Instructions are located on the last page of this paperwork. If you have not heard from [place] regarding the results in 2 weeks, please contact this office.

## 2021-04-25 NOTE — Progress Notes (Signed)
Established Patient Office Visit  Subjective:  Patient ID: Ricky Diaz, male    DOB: 10-16-1975  Age: 45 y.o. MRN: [id number]  CC:  Chief Complaint  Patient presents with   Annual Exam    Patient states he is here for his annual physical. Patient also need medication refills.    HPI Ricky Diaz presents for CPE  No acute concerns. Needs med refills  Hypertension: Patient Currently taking: amlodipine 10mg  po qd, spironolactone 25mg  po qd, lisinopril 10mg  po qd, labetalol 200mg  po qd.  Good effect. No AEs. Denies CV symptoms including: chest pain, shob, doe, headache, visual changes, fatigue, claudication, and dependent edema.   Previous readings and labs: BP Readings from Last 3 Encounters:  04/25/21 120/88  06/20/20 128/78  04/08/20 129/82   Lab Results  Component Value Date   CREATININE 1.17 04/08/2020    Due for flu shot, would like to get today. No AE in past.   Due for colon ca screen. Will send cologuard. Discussed risks, benefits, alternatives with patient   Past Medical History:  Diagnosis Date   Glucose intolerance (impaired glucose tolerance)    Hyperlipidemia    Hypertension    onset age 62.   OSA on CPAP 07/23/2012   Sleep difficulties     Past Surgical History:  Procedure Laterality Date   NO PAST SURGERIES      Family History  Problem Relation Age of Onset   Hypertension Brother    Sleep apnea Brother    Sleep apnea Brother     Social History   Socioeconomic History   Marital status: Married    Spouse name: [name]   Number of children: 4   Years of education: 14   Highest education level: Not on file  Occupational History    Employer: [employer]  Tobacco Use   Smoking status: Never   Smokeless tobacco: Never  Vaping Use   Vaping Use: Never used  Substance and Sexual Activity   Alcohol use: Yes    Alcohol/week: 0.0 standard drinks    Comment: rare   Drug use: No   Sexual activity: Yes     Partners: Female  Other Topics Concern   Not on file  Social History Narrative   Marital status: married x 2007. From 04/10/2020; 32 since 2000.      Children: 4 children; no grandchildren.      Lives: with wife ([name]) , children.      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      Tobacco: none      Alcohol: none      Drugs: none      Exercise:  Sporadic.   Patient is right-handed.   Patient has a college education.   Patient drinks very little caffeine.   Social Determinants of Health   Financial Resource Strain: Not on file  Food Insecurity: Not on file  Transportation Needs: Not on file  Physical Activity: Not on file  Stress: Not on file  Social Connections: Not on file  Intimate Partner Violence: Not on file    Outpatient Medications Prior to Visit  Medication Sig Dispense Refill   amLODipine (NORVASC) 10 MG tablet Take 1 tablet (10 mg total) by mouth daily. 90 tablet 3   labetalol (NORMODYNE) 200 MG tablet Take 1 tablet (200 mg total) by mouth daily. 90 tablet 3   lisinopril (ZESTRIL) 10 MG tablet Take 1 tablet (10 mg total) by  mouth daily. 90 tablet 3   spironolactone (ALDACTONE) 25 MG tablet TAKE 1 TABLET BY MOUTH EVERY DAY 90 tablet 0   No facility-administered medications prior to visit.    Allergies  Allergen Reactions   Other     ROS Review of Systems  Constitutional: Negative.   HENT: Negative.    Eyes: Negative.   Respiratory: Negative.    Cardiovascular: Negative.   Gastrointestinal: Negative.   Genitourinary: Negative.   Musculoskeletal: Negative.   Skin: Negative.   Neurological: Negative.   Psychiatric/Behavioral: Negative.    All other systems reviewed and are negative.    Objective:    Physical Exam Vitals and nursing note reviewed.  Constitutional:      General: He is not in acute distress.    Appearance: Normal appearance. He is normal weight. He is not ill-appearing, toxic-appearing or diaphoretic.  HENT:      Head: Normocephalic and atraumatic.     Right Ear: Tympanic membrane, ear canal and external ear normal. There is no impacted cerumen.     Left Ear: Tympanic membrane, ear canal and external ear normal. There is no impacted cerumen.     Nose: Nose normal. No congestion or rhinorrhea.     Mouth/Throat:     Mouth: Mucous membranes are moist.     Pharynx: Oropharynx is clear. No oropharyngeal exudate or posterior oropharyngeal erythema.  Eyes:     General: No scleral icterus.       Right eye: No discharge.        Left eye: No discharge.     Extraocular Movements: Extraocular movements intact.     Conjunctiva/sclera: Conjunctivae normal.     Pupils: Pupils are equal, round, and reactive to light.  Neck:     Vascular: No carotid bruit.  Cardiovascular:     Rate and Rhythm: Normal rate and regular rhythm.     Pulses: Normal pulses.     Heart sounds: Normal heart sounds. No murmur heard.   No friction rub. No gallop.  Pulmonary:     Effort: Pulmonary effort is normal. No respiratory distress.     Breath sounds: Normal breath sounds. No stridor. No wheezing, rhonchi or rales.  Chest:     Chest wall: No tenderness.  Abdominal:     General: Abdomen is flat. Bowel sounds are normal. There is no distension.     Palpations: Abdomen is soft. There is no mass.     Tenderness: There is no abdominal tenderness. There is no right CVA tenderness, left CVA tenderness, guarding or rebound.     Hernia: No hernia is present.  Musculoskeletal:        General: No swelling, tenderness, deformity or signs of injury. Normal range of motion.     Cervical back: Normal range of motion and neck supple. No rigidity or tenderness.     Right lower leg: No edema.     Left lower leg: No edema.  Lymphadenopathy:     Cervical: No cervical adenopathy.  Skin:    General: Skin is warm and dry.     Capillary Refill: Capillary refill takes less than 2 seconds.     Coloration: Skin is not jaundiced or pale.      Findings: No bruising, erythema, lesion or rash.  Neurological:     General: No focal deficit present.     Mental Status: He is alert and oriented to person, place, and time. Mental status is at baseline.     Cranial Nerves:  No cranial nerve deficit.     Motor: No weakness.     Gait: Gait normal.  Psychiatric:        Mood and Affect: Mood normal.        Behavior: Behavior normal.        Thought Content: Thought content normal.        Judgment: Judgment normal.    BP 120/88   Pulse 82   Temp 98.3 F (36.8 C) (Temporal)   Resp 18   Ht 5\' 3"  (1.6 m)   Wt 180 lb 6.4 oz (81.8 kg)   SpO2 99%   BMI 31.96 kg/m  Wt Readings from Last 3 Encounters:  04/25/21 180 lb 6.4 oz (81.8 kg)  06/20/20 191 lb 8 oz (86.9 kg)  04/08/20 192 lb (87.1 kg)     Health Maintenance Due  Topic Date Due   INFLUENZA VACCINE  02/20/2021    There are no preventive care reminders to display for this patient.  Lab Results  Component Value Date   TSH 1.390 04/08/2020   Lab Results  Component Value Date   WBC 4.0 04/08/2020   HGB 13.1 04/08/2020   HCT 39.9 04/08/2020   MCV 92 04/08/2020   PLT 162 04/08/2020   Lab Results  Component Value Date   NA 139 04/08/2020   K 4.7 04/08/2020   CO2 22 04/08/2020   GLUCOSE 117 (H) 04/08/2020   BUN 15 04/08/2020   CREATININE 1.17 04/08/2020   BILITOT <0.2 04/08/2020   ALKPHOS 62 04/08/2020   AST 19 04/08/2020   ALT 35 04/08/2020   PROT 7.3 04/08/2020   ALBUMIN 4.7 04/08/2020   CALCIUM 9.7 04/08/2020   Lab Results  Component Value Date   CHOL 177 04/08/2020   Lab Results  Component Value Date   HDL 48 04/08/2020   Lab Results  Component Value Date   LDLCALC 119 (H) 04/08/2020   Lab Results  Component Value Date   TRIG 50 04/08/2020   Lab Results  Component Value Date   CHOLHDL 3.7 04/08/2020   Lab Results  Component Value Date   HGBA1C 6.9 (H) 04/08/2020      Assessment & Plan:   Problem List Items Addressed This Visit    None   No orders of the defined types were placed in this encounter.   Follow-up: No follow-ups on file.    04/10/2020, NP

## 2021-04-26 ENCOUNTER — Other Ambulatory Visit: Payer: Self-pay | Admitting: Registered Nurse

## 2021-04-26 DIAGNOSIS — E119 Type 2 diabetes mellitus without complications: Secondary | ICD-10-CM

## 2021-04-26 MED ORDER — METFORMIN HCL 1000 MG PO TABS
1000.0000 mg | ORAL_TABLET | Freq: Two times a day (BID) | ORAL | 0 refills | Status: DC
Start: 2021-04-26 — End: 2021-07-18

## 2021-04-27 ENCOUNTER — Telehealth: Payer: Self-pay | Admitting: Registered Nurse

## 2021-04-27 NOTE — Telephone Encounter (Signed)
Caller name: Hong Moring  On DPR? :yes/no: Yes  Call back number:  [phone]  Provider they see:   Janeece Agee  Reason for call: Patient states that Richard added metformin to his list of prescriptions and he would like to discuss this with him

## 2021-04-27 NOTE — Telephone Encounter (Signed)
Called and spoke with patient about concerns and patient stated that this is the first time that he has had to take medication for his diabetes and did not realize it was that bad. Is there anything he can do to get his glucose/A1C down without meds. Please advise

## 2021-04-27 NOTE — Telephone Encounter (Signed)
I have called patient and he understands the importance of being on the metformin. Patient has been scheduled for a follow up appt for 3 months to recheck.

## 2021-04-27 NOTE — Telephone Encounter (Signed)
See other note -   Meds are a must given his numbers. Hopefully does not have to be on them forever.  Thanks,  Luan Pulling

## 2021-04-27 NOTE — Progress Notes (Signed)
If he has not yet been on it, it's first line. If he has any side effects, we can try another agent. This should be done in conjunction with diet and exercise, which alone will not be enough to lower his numbers. Hopefpully once we get the numbers back down, he can maintain them with healthy diet and exercise.  Thanks,  Luan Pulling

## 2021-05-04 ENCOUNTER — Telehealth: Payer: Self-pay | Admitting: Adult Health

## 2021-05-04 NOTE — Telephone Encounter (Signed)
Patient arrived to the lobby today requesting a sooner apt than the one sch 11/29 due to DOT requirements. Nothing available prior to what he already has sch. Best call back is [phone]

## 2021-05-04 NOTE — Telephone Encounter (Signed)
Spoke with pt. He reports he is doing well on CPAP. He agrees to a 15 min visit, needs AM, will come Monday 10/17 at least 15 min early for 8:15 appt. He was very Adult nurse. Canceled 11/29 appt.

## 2021-05-08 ENCOUNTER — Ambulatory Visit: Payer: 59 | Admitting: Adult Health

## 2021-05-08 ENCOUNTER — Encounter: Payer: Self-pay | Admitting: Adult Health

## 2021-05-08 VITALS — BP 125/89 | HR 79 | Ht 63.0 in | Wt 181.0 lb

## 2021-05-08 DIAGNOSIS — Z9989 Dependence on other enabling machines and devices: Secondary | ICD-10-CM

## 2021-05-08 DIAGNOSIS — G4733 Obstructive sleep apnea (adult) (pediatric): Secondary | ICD-10-CM | POA: Diagnosis not present

## 2021-05-08 NOTE — Progress Notes (Signed)
PATIENT: Ricky Diaz DOB: 22-Jan-1976  REASON FOR VISIT: follow up HISTORY FROM: patient HISTORY OF PRESENT ILLNESS: Today 05/08/21:  Ricky Diaz is a 45 year old male with a history of OSA on CPAP. He returns today for follow-up. Reports CPAP is working well. Here for annual DOT compliance.       REVIEW OF SYSTEMS: Out of a complete 14 system review of symptoms, the patient complains only of the following symptoms, and all other reviewed systems are negative.  ESS 1  ALLERGIES: Allergies  Allergen Reactions   Other     HOME MEDICATIONS: Outpatient Medications Prior to Visit  Medication Sig Dispense Refill   amLODipine (NORVASC) 10 MG tablet Take 1 tablet (10 mg total) by mouth daily. 90 tablet 3   labetalol (NORMODYNE) 200 MG tablet Take 1 tablet (200 mg total) by mouth daily. 90 tablet 3   lisinopril (ZESTRIL) 10 MG tablet Take 1 tablet (10 mg total) by mouth daily. 90 tablet 3   metFORMIN (GLUCOPHAGE) 1000 MG tablet Take 1 tablet (1,000 mg total) by mouth 2 (two) times daily with a meal. 180 tablet 0   spironolactone (ALDACTONE) 25 MG tablet Take 1 tablet (25 mg total) by mouth daily. 90 tablet 3   No facility-administered medications prior to visit.    PAST MEDICAL HISTORY: Past Medical History:  Diagnosis Date   Glucose intolerance (impaired glucose tolerance)    Hyperlipidemia    Hypertension    onset age 44.   OSA on CPAP 07/23/2012   Sleep difficulties     PAST SURGICAL HISTORY: Past Surgical History:  Procedure Laterality Date   NO PAST SURGERIES      FAMILY HISTORY: Family History  Problem Relation Age of Onset   Hypertension Brother    Sleep apnea Brother    Sleep apnea Brother     SOCIAL HISTORY: Social History   Socioeconomic History   Marital status: Married    Spouse name: [name]   Number of children: 4   Years of education: 14   Highest education level: Not on file  Occupational History    Employer: [employer]  Tobacco Use   Smoking status: Never   Smokeless tobacco: Never  Vaping Use   Vaping Use: Never used  Substance and Sexual Activity   Alcohol use: Not Currently    Comment: rare   Drug use: No   Sexual activity: Yes    Partners: Female  Other Topics Concern   Not on file  Social History Narrative   Marital status: married x 2007. From [place]; [place] since 2000.      Children: 4 children; no grandchildren.      Lives: with wife ([name]) , children.      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      Tobacco: none      Alcohol: none      Drugs: none      Exercise:  Sporadic.   Patient is right-handed.   Patient has a college education.   Patient no longer drinks caffeine   Social Determinants of Health   Financial Resource Strain: Not on file  Food Insecurity: Not on file  Transportation Needs: Not on file  Physical Activity: Not on file  Stress: Not on file  Social Connections: Not on file  Intimate Partner Violence: Not on file      PHYSICAL EXAM  Vitals:   05/08/21 0758  BP: 125/89  Pulse: 79  Weight: 181 lb (82.1 kg)  Height: 5\' 3"  (1.6 m)   Body mass index is 32.06 kg/m.  Generalized: Well developed, in no acute distress  Chest: Lungs clear to auscultation bilaterally  Neurological examination  Mentation: Alert oriented to time, place, history taking. Follows all commands speech and language fluent Cranial nerve II-XII: Extraocular movements were full, visual field were full on confrontational test Head turning and shoulder shrug  were normal and symmetric. Motor: The motor testing reveals 5 over 5 strength of all 4 extremities. Good symmetric motor tone is noted throughout.  Sensory: Sensory testing is intact to soft touch on all 4 extremities. No evidence of extinction is noted.  Gait and station: Gait is normal.    DIAGNOSTIC DATA (LABS, IMAGING, TESTING) - I reviewed patient records, labs, notes, testing and imaging  myself where available.  Lab Results  Component Value Date   WBC 3.8 (L) 04/25/2021   HGB 13.3 04/25/2021   HCT 39.8 04/25/2021   MCV 91.2 04/25/2021   PLT 195.0 04/25/2021      Component Value Date/Time   NA 138 04/25/2021 0848   NA 139 04/08/2020 0945   K 4.3 04/25/2021 0848   CL 103 04/25/2021 0848   CO2 26 04/25/2021 0848   GLUCOSE 129 (H) 04/25/2021 0848   BUN 19 04/25/2021 0848   BUN 15 04/08/2020 0945   CREATININE 1.05 04/25/2021 0848   CREATININE 1.14 04/02/2016 0958   CALCIUM 9.9 04/25/2021 0848   PROT 7.1 04/25/2021 0848   PROT 7.3 04/08/2020 0945   ALBUMIN 4.5 04/25/2021 0848   ALBUMIN 4.7 04/08/2020 0945   AST 18 04/25/2021 0848   ALT 33 04/25/2021 0848   ALKPHOS 58 04/25/2021 0848   BILITOT 0.4 04/25/2021 0848   BILITOT <0.2 04/08/2020 0945   GFRNONAA 74 04/08/2020 0945   GFRNONAA 79 04/02/2016 0958   GFRAA 86 04/08/2020 0945   GFRAA >89 04/02/2016 0958   Lab Results  Component Value Date   CHOL 169 04/25/2021   HDL 45.30 04/25/2021   LDLCALC 105 (H) 04/25/2021   TRIG 91.0 04/25/2021   CHOLHDL 4 04/25/2021   Lab Results  Component Value Date   HGBA1C 11.1 (H) 04/25/2021   Lab Results  Component Value Date   VITAMINB12 1,027 (H) 08/20/2015   Lab Results  Component Value Date   TSH 1.51 04/25/2021      ASSESSMENT AND PLAN 45 y.o. year old male  has a past medical history of Glucose intolerance (impaired glucose tolerance), Hyperlipidemia, Hypertension, OSA on CPAP (07/23/2012), and Sleep difficulties. here with:  OSA on CPAP  - CPAP compliance excellent - Good treatment of AHI  - Encourage patient to use CPAP nightly and > 4 hours each night - F/U in 1 year or sooner if needed     09/20/2012, MSN, NP-C 05/08/2021, 8:29 AM [facility] [address] [place], [place] [phone]

## 2021-05-08 NOTE — Patient Instructions (Signed)
Continue using CPAP nightly and greater than 4 hours each night °If your symptoms worsen or you develop new symptoms please let us know.  ° °

## 2021-06-20 ENCOUNTER — Ambulatory Visit: Payer: 59 | Admitting: Adult Health

## 2021-07-18 ENCOUNTER — Other Ambulatory Visit: Payer: Self-pay | Admitting: Registered Nurse

## 2021-07-18 DIAGNOSIS — E119 Type 2 diabetes mellitus without complications: Secondary | ICD-10-CM

## 2021-08-01 ENCOUNTER — Encounter: Payer: Self-pay | Admitting: Registered Nurse

## 2021-08-01 ENCOUNTER — Ambulatory Visit: Payer: 59 | Admitting: Registered Nurse

## 2021-08-01 VITALS — BP 132/88 | HR 87 | Temp 98.0°F | Resp 17 | Wt 187.0 lb

## 2021-08-01 DIAGNOSIS — E119 Type 2 diabetes mellitus without complications: Secondary | ICD-10-CM | POA: Diagnosis not present

## 2021-08-01 DIAGNOSIS — I10 Essential (primary) hypertension: Secondary | ICD-10-CM

## 2021-08-01 LAB — POCT GLYCOSYLATED HEMOGLOBIN (HGB A1C): Hemoglobin A1C: 6.6 % — AB (ref 4.0–5.6)

## 2021-08-01 MED ORDER — LISINOPRIL 10 MG PO TABS: 10.0000 mg | ORAL_TABLET | Freq: Every day | ORAL | 3 refills | Status: DC

## 2021-08-01 MED ORDER — METFORMIN HCL 1000 MG PO TABS: 1000.0000 mg | ORAL_TABLET | Freq: Every day | ORAL | 1 refills | Status: DC

## 2021-08-01 MED ORDER — ROSUVASTATIN CALCIUM 5 MG PO TABS: 5.0000 mg | ORAL_TABLET | Freq: Every day | ORAL | 3 refills | Status: DC

## 2021-08-01 MED ORDER — SPIRONOLACTONE 25 MG PO TABS: 25.0000 mg | ORAL_TABLET | Freq: Every day | ORAL | 3 refills | Status: DC

## 2021-08-01 MED ORDER — LABETALOL HCL 200 MG PO TABS: 200.0000 mg | ORAL_TABLET | Freq: Every day | ORAL | 3 refills | Status: DC

## 2021-08-01 MED ORDER — AMLODIPINE BESYLATE 10 MG PO TABS: 10.0000 mg | ORAL_TABLET | Freq: Every day | ORAL | 3 refills | Status: DC

## 2021-08-01 NOTE — Progress Notes (Signed)
Established Patient Office Visit  Subjective:  Patient ID: Ricky Diaz, male    DOB: 05-10-1976  Age: 46 y.o. MRN: [id number]  CC:  Chief Complaint  Patient presents with   Diabetes    HPI Ricky Diaz presents for t2dm  Last A1c:  Lab Results  Component Value Date   HGBA1C 11.1 (H) 04/25/2021    Currently taking: metformin 1000mg  po qhs  Written as bid - notes taking it during the day makes him nauseous/dizzy. No new complications Reports good compliance with medications Diet has been improved since last visit Exercise habits have been steady   Past Medical History:  Diagnosis Date   Glucose intolerance (impaired glucose tolerance)    Hyperlipidemia    Hypertension    onset age 16.   OSA on CPAP 07/23/2012   Sleep difficulties     Past Surgical History:  Procedure Laterality Date   NO PAST SURGERIES      Family History  Problem Relation Age of Onset   Hypertension Brother    Sleep apnea Brother    Sleep apnea Brother     Social History   Socioeconomic History   Marital status: Married    Spouse name: [name]   Number of children: 4   Years of education: 14   Highest education level: Not on file  Occupational History    Employer: [employer]  Tobacco Use   Smoking status: Never   Smokeless tobacco: Never  Vaping Use   Vaping Use: Never used  Substance and Sexual Activity   Alcohol use: Not Currently    Comment: rare   Drug use: No   Sexual activity: Yes    Partners: Female  Other Topics Concern   Not on file  Social History Narrative   Marital status: married x 2007. From 2008; [place] since 2000.      Children: 4 children; no grandchildren.      Lives: with wife ([name]) , children.      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      Tobacco: none      Alcohol: none      Drugs: none      Exercise:  Sporadic.   Patient is right-handed.   Patient has a college education.   Patient no longer  drinks caffeine   Social Determinants of Health   Financial Resource Strain: Not on file  Food Insecurity: Not on file  Transportation Needs: Not on file  Physical Activity: Not on file  Stress: Not on file  Social Connections: Not on file  Intimate Partner Violence: Not on file    Outpatient Medications Prior to Visit  Medication Sig Dispense Refill   amLODipine (NORVASC) 10 MG tablet Take 1 tablet (10 mg total) by mouth daily. 90 tablet 3   labetalol (NORMODYNE) 200 MG tablet Take 1 tablet (200 mg total) by mouth daily. 90 tablet 3   lisinopril (ZESTRIL) 10 MG tablet Take 1 tablet (10 mg total) by mouth daily. 90 tablet 3   metFORMIN (GLUCOPHAGE) 1000 MG tablet TAKE 1 TABLET BY MOUTH 2 TIMES DAILY WITH A MEAL (Patient taking differently: 1 tab at bedtime) 180 tablet 0   spironolactone (ALDACTONE) 25 MG tablet Take 1 tablet (25 mg total) by mouth daily. 90 tablet 3   No facility-administered medications prior to visit.    Allergies  Allergen Reactions   Other     ROS Review of Systems  Constitutional: Negative.  HENT: Negative.    Eyes: Negative.   Respiratory: Negative.    Cardiovascular: Negative.   Gastrointestinal: Negative.   Genitourinary: Negative.   Musculoskeletal: Negative.   Skin: Negative.   Neurological: Negative.   Psychiatric/Behavioral: Negative.    All other systems reviewed and are negative.    Objective:    Physical Exam Constitutional:      General: He is not in acute distress.    Appearance: Normal appearance. He is normal weight. He is not ill-appearing, toxic-appearing or diaphoretic.  Cardiovascular:     Rate and Rhythm: Normal rate and regular rhythm.     Heart sounds: Normal heart sounds. No murmur heard.   No friction rub. No gallop.  Pulmonary:     Effort: Pulmonary effort is normal. No respiratory distress.     Breath sounds: Normal breath sounds. No stridor. No wheezing, rhonchi or rales.  Chest:     Chest wall: No tenderness.   Neurological:     General: No focal deficit present.     Mental Status: He is alert and oriented to person, place, and time. Mental status is at baseline.  Psychiatric:        Mood and Affect: Mood normal.        Behavior: Behavior normal.        Thought Content: Thought content normal.        Judgment: Judgment normal.    BP 132/88    Pulse 87    Temp 98 F (36.7 C)    Resp 17    Wt 187 lb (84.8 kg)    SpO2 98%    BMI 33.13 kg/m  Wt Readings from Last 3 Encounters:  08/01/21 187 lb (84.8 kg)  05/08/21 181 lb (82.1 kg)  04/25/21 180 lb 6.4 oz (81.8 kg)     Health Maintenance Due  Topic Date Due   COVID-19 Vaccine (1) Never done   Pneumococcal Vaccine 41-19 Years old (1 - PCV) Never done    There are no preventive care reminders to display for this patient.  Lab Results  Component Value Date   TSH 1.51 04/25/2021   Lab Results  Component Value Date   WBC 3.8 (L) 04/25/2021   HGB 13.3 04/25/2021   HCT 39.8 04/25/2021   MCV 91.2 04/25/2021   PLT 195.0 04/25/2021   Lab Results  Component Value Date   NA 138 04/25/2021   K 4.3 04/25/2021   CO2 26 04/25/2021   GLUCOSE 129 (H) 04/25/2021   BUN 19 04/25/2021   CREATININE 1.05 04/25/2021   BILITOT 0.4 04/25/2021   ALKPHOS 58 04/25/2021   AST 18 04/25/2021   ALT 33 04/25/2021   PROT 7.1 04/25/2021   ALBUMIN 4.5 04/25/2021   CALCIUM 9.9 04/25/2021   GFR 84.62 04/25/2021   Lab Results  Component Value Date   CHOL 169 04/25/2021   Lab Results  Component Value Date   HDL 45.30 04/25/2021   Lab Results  Component Value Date   LDLCALC 105 (H) 04/25/2021   Lab Results  Component Value Date   TRIG 91.0 04/25/2021   Lab Results  Component Value Date   CHOLHDL 4 04/25/2021   Lab Results  Component Value Date   HGBA1C 11.1 (H) 04/25/2021      Assessment & Plan:   Problem List Items Addressed This Visit   None Visit Diagnoses     Type 2 diabetes mellitus without complication, without long-term  current use of insulin (HCC)    -  Primary   Relevant Medications   rosuvastatin (CRESTOR) 5 MG tablet   metFORMIN (GLUCOPHAGE) 1000 MG tablet   lisinopril (ZESTRIL) 10 MG tablet   Other Relevant Orders   POCT HgB A1C   Essential hypertension       Relevant Medications   rosuvastatin (CRESTOR) 5 MG tablet   lisinopril (ZESTRIL) 10 MG tablet   amLODipine (NORVASC) 10 MG tablet   labetalol (NORMODYNE) 200 MG tablet   spironolactone (ALDACTONE) 25 MG tablet       Meds ordered this encounter  Medications   rosuvastatin (CRESTOR) 5 MG tablet    Sig: Take 1 tablet (5 mg total) by mouth daily.    Dispense:  90 tablet    Refill:  3    Order Specific Question:   Supervising Provider    Answer:   Neva SeatGREENE, JEFFREY R [[id number]]   metFORMIN (GLUCOPHAGE) 1000 MG tablet    Sig: Take 1 tablet (1,000 mg total) by mouth at bedtime. 1 tab at bedtime    Dispense:  90 tablet    Refill:  1    Order Specific Question:   Supervising Provider    Answer:   Neva SeatGREENE, JEFFREY R [[id number]]   lisinopril (ZESTRIL) 10 MG tablet    Sig: Take 1 tablet (10 mg total) by mouth daily.    Dispense:  90 tablet    Refill:  3    Order Specific Question:   Supervising Provider    Answer:   Neva SeatGREENE, JEFFREY R [[id number]]   amLODipine (NORVASC) 10 MG tablet    Sig: Take 1 tablet (10 mg total) by mouth daily.    Dispense:  90 tablet    Refill:  3    Order Specific Question:   Supervising Provider    Answer:   Neva SeatGREENE, JEFFREY R [[id number]]   labetalol (NORMODYNE) 200 MG tablet    Sig: Take 1 tablet (200 mg total) by mouth daily.    Dispense:  90 tablet    Refill:  3    Order Specific Question:   Supervising Provider    Answer:   Neva SeatGREENE, JEFFREY R [[id number]]   spironolactone (ALDACTONE) 25 MG tablet    Sig: Take 1 tablet (25 mg total) by mouth daily.    Dispense:  90 tablet    Refill:  3    Order Specific Question:   Supervising Provider    Answer:   Neva SeatGREENE, JEFFREY R [[id number]]    Follow-up: Return in about 6 months (around 01/29/2022)  for CPE and labs.   PLAN Start rosuvastatin to lower CV risk Sugars greatly improved - at goal of below 7.0 Continue nighttime metformin Return in 6 mo for CPE and labs Patient encouraged to call clinic with any questions, comments, or concerns.  Janeece Ageeichard Chiron Campione, NP

## 2021-08-01 NOTE — Patient Instructions (Addendum)
[name] [facility] seen great improvement on the A1c! We're at our goal of below 7.0 with today's result of 6.6!  Continue the metformin at bedtime. It's working as intended. Continue healthy diet and regular exercise.  I want to add a medication called rosuvastatin 5 mg to take with the dose of metformin each evening. This will also lower risk for adverse cardiovascular events.   Let's plan to recheck on the sugars again in about 6 months. We can do a physical and lab work at that time.  Thank you  Rich

## 2022-01-08 ENCOUNTER — Telehealth: Payer: Self-pay | Admitting: Adult Health

## 2022-01-08 DIAGNOSIS — G4733 Obstructive sleep apnea (adult) (pediatric): Secondary | ICD-10-CM

## 2022-01-08 NOTE — Telephone Encounter (Signed)
Pt called and LVM stating that he is needing his cpap machine replaced. Please advise.

## 2022-01-08 NOTE — Telephone Encounter (Signed)
LMVM for pt to return call.  Did he know how hold the machine is currently,  if 46 yrs old may qualify for new machine.  If not repair would need to be done by [facility].  Call back at his convenience. Set up -12-06-2017.

## 2022-01-08 NOTE — Telephone Encounter (Signed)
Pt returned call, he states that the spap machine is stating motor life exceeded.  It is still working but needs order for new machne.  I told him that they may order HST to check and see if anything has changed.  He verbalized understanding. DME [facility].

## 2022-01-09 NOTE — Addendum Note (Signed)
Addended by: Enedina Finner on: 01/09/2022 08:39 AM   Modules accepted: Orders

## 2022-01-09 NOTE — Telephone Encounter (Signed)
Home sleep test ordered 

## 2022-01-31 ENCOUNTER — Telehealth: Payer: Self-pay | Admitting: Adult Health

## 2022-01-31 NOTE — Telephone Encounter (Signed)
unable to leave message the mailbox is full  [employer] no Berkley Harvey req spoke to [facility] ref # [id number]

## 2022-02-13 NOTE — Telephone Encounter (Signed)
unable to eave vmail

## 2022-04-03 ENCOUNTER — Telehealth: Payer: Self-pay | Admitting: *Deleted

## 2022-04-03 ENCOUNTER — Ambulatory Visit: Payer: 59 | Admitting: Adult Health

## 2022-04-03 ENCOUNTER — Encounter: Payer: Self-pay | Admitting: Adult Health

## 2022-04-03 VITALS — BP 139/95 | HR 81 | Ht 64.0 in | Wt 184.0 lb

## 2022-04-03 DIAGNOSIS — G4733 Obstructive sleep apnea (adult) (pediatric): Secondary | ICD-10-CM

## 2022-04-03 DIAGNOSIS — Z9989 Dependence on other enabling machines and devices: Secondary | ICD-10-CM | POA: Diagnosis not present

## 2022-04-03 NOTE — Progress Notes (Signed)
PATIENT: Ricky Diaz DOB: 11/08/1975  REASON FOR VISIT: follow up HISTORY FROM: patient HISTORY OF PRESENT ILLNESS:Dr. Dohmeier  Chief Complaint  Patient presents with   Follow-up    Pt in 19  Pt here for CPAP f/u   Pt is asking for new CPAP machine . Pt set up date 11/2017. The machine is showing exceeded motor life .    Today 04/03/22:  Ricky Diaz is a 46 year old male with a history of obstructive sleep apnea on CPAP.  He returns today for follow-up.  His download is below.  He does state that his CPAP machine is staying exceeded motor life.  However his set up date is not clear.  He has not discussed this with his DME company.  He returns today for evaluation.    05/08/21: Ricky Diaz is a 46 year old male with a history of OSA on CPAP. He returns today for follow-up. Reports CPAP is working well. Here for annual DOT compliance.       REVIEW OF SYSTEMS: Out of a complete 14 system review of symptoms, the patient complains only of the following symptoms, and all other reviewed systems are negative.  ESS 1  ALLERGIES: Allergies  Allergen Reactions   Other     HOME MEDICATIONS: Outpatient Medications Prior to Visit  Medication Sig Dispense Refill   amLODipine (NORVASC) 10 MG tablet Take 1 tablet (10 mg total) by mouth daily. 90 tablet 3   labetalol (NORMODYNE) 200 MG tablet Take 1 tablet (200 mg total) by mouth daily. 90 tablet 3   lisinopril (ZESTRIL) 10 MG tablet Take 1 tablet (10 mg total) by mouth daily. 90 tablet 3   metFORMIN (GLUCOPHAGE) 1000 MG tablet Take 1 tablet (1,000 mg total) by mouth at bedtime. 1 tab at bedtime 90 tablet 1   rosuvastatin (CRESTOR) 5 MG tablet Take 1 tablet (5 mg total) by mouth daily. 90 tablet 3   spironolactone (ALDACTONE) 25 MG tablet Take 1 tablet (25 mg total) by mouth daily. 90 tablet 3   No facility-administered medications prior to visit.    PAST MEDICAL HISTORY: Past Medical History:  Diagnosis Date   Glucose  intolerance (impaired glucose tolerance)    Hyperlipidemia    Hypertension    onset age 49.   OSA on CPAP 07/23/2012   Sleep difficulties     PAST SURGICAL HISTORY: Past Surgical History:  Procedure Laterality Date   NO PAST SURGERIES      FAMILY HISTORY: Family History  Problem Relation Age of Onset   Hypertension Brother    Sleep apnea Brother    Sleep apnea Brother     SOCIAL HISTORY: Social History   Socioeconomic History   Marital status: Married    Spouse name: [name]   Number of children: 4   Years of education: 14   Highest education level: Not on file  Occupational History    Employer: [employer]  Tobacco Use   Smoking status: Never   Smokeless tobacco: Never  Vaping Use   Vaping Use: Never used  Substance and Sexual Activity   Alcohol use: Not Currently    Comment: rare   Drug use: No   Sexual activity: Yes    Partners: Female  Other Topics Concern   Not on file  Social History Narrative   Marital status: married x 2007. From [place]; [place] since 2000.      Children: 4 children; no grandchildren.  Lives: with wife ([name]) , children.      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      Tobacco: none      Alcohol: none      Drugs: none      Exercise:  Sporadic.   Patient is right-handed.   Patient has a college education.   Patient no longer drinks caffeine   Social Determinants of Health   Financial Resource Strain: Low Risk  (04/06/2019)   Overall Financial Resource Strain (CARDIA)    Difficulty of Paying Living Expenses: Not hard at all  Food Insecurity: No Food Insecurity (04/06/2019)   Hunger Vital Sign    Worried About Running Out of Food in the Last Year: Never true    Ran Out of Food in the Last Year: Never true  Transportation Needs: No Transportation Needs (04/06/2019)   PRAPARE - Administrator, Civil Service (Medical): No    Lack of Transportation (Non-Medical): No  Physical  Activity: Insufficiently Active (04/06/2019)   Exercise Vital Sign    Days of Exercise per Week: 4 days    Minutes of Exercise per Session: 30 min  Stress: No Stress Concern Present (04/06/2019)   [employer] of Occupational Health - Occupational Stress Questionnaire    Feeling of Stress : Only a little  Social Connections: Unknown (04/06/2019)   Social Connection and Isolation Panel [NHANES]    Frequency of Communication with Friends and Family: Three times a week    Frequency of Social Gatherings with Friends and Family: Twice a week    Attends Religious Services: Patient refused    Active Member of Clubs or Organizations: Patient refused    Attends Banker Meetings: Patient refused    Marital Status: Married  Catering manager Violence: Not At Risk (04/06/2019)   Humiliation, Afraid, Rape, and Kick questionnaire    Fear of Current or Ex-Partner: No    Emotionally Abused: No    Physically Abused: No    Sexually Abused: No      PHYSICAL EXAM  Vitals:   04/03/22 0826  BP: (!) 139/95  Pulse: 81  Weight: 184 lb (83.5 kg)  Height: 5\' 4"  (1.626 m)   Body mass index is 31.58 kg/m.  Generalized: Well developed, in no acute distress  Chest: Lungs clear to auscultation bilaterally  Neurological examination  Mentation: Alert oriented to time, place, history taking. Follows all commands speech and language fluent Gait and station: Gait is normal.    DIAGNOSTIC DATA (LABS, IMAGING, TESTING) - I reviewed patient records, labs, notes, testing and imaging myself where available.  Lab Results  Component Value Date   WBC 3.8 (L) 04/25/2021   HGB 13.3 04/25/2021   HCT 39.8 04/25/2021   MCV 91.2 04/25/2021   PLT 195.0 04/25/2021      Component Value Date/Time   NA 138 04/25/2021 0848   NA 139 04/08/2020 0945   K 4.3 04/25/2021 0848   CL 103 04/25/2021 0848   CO2 26 04/25/2021 0848   GLUCOSE 129 (H) 04/25/2021 0848   BUN 19 04/25/2021 0848   BUN 15  04/08/2020 0945   CREATININE 1.05 04/25/2021 0848   CREATININE 1.14 04/02/2016 0958   CALCIUM 9.9 04/25/2021 0848   PROT 7.1 04/25/2021 0848   PROT 7.3 04/08/2020 0945   ALBUMIN 4.5 04/25/2021 0848   ALBUMIN 4.7 04/08/2020 0945   AST 18 04/25/2021 0848   ALT 33 04/25/2021 0848   ALKPHOS 58 04/25/2021  0848   BILITOT 0.4 04/25/2021 0848   BILITOT <0.2 04/08/2020 0945   GFRNONAA 74 04/08/2020 0945   GFRNONAA 79 04/02/2016 0958   GFRAA 86 04/08/2020 0945   GFRAA >89 04/02/2016 0958   Lab Results  Component Value Date   CHOL 169 04/25/2021   HDL 45.30 04/25/2021   LDLCALC 105 (H) 04/25/2021   TRIG 91.0 04/25/2021   CHOLHDL 4 04/25/2021   Lab Results  Component Value Date   HGBA1C 6.6 (A) 08/01/2021   Lab Results  Component Value Date   VITAMINB12 1,027 (H) 08/20/2015   Lab Results  Component Value Date   TSH 1.51 04/25/2021      ASSESSMENT AND PLAN 46 y.o. year old male  has a past medical history of Glucose intolerance (impaired glucose tolerance), Hyperlipidemia, Hypertension, OSA on CPAP (07/23/2012), and Sleep difficulties. here with:  OSA on CPAP  - CPAP compliance excellent - Good treatment of AHI  - Encourage patient to use CPAP nightly and > 4 hours each night - Advised we would call  DME company in regards to his machine.  RN called and Patsy Lager stated that his setup date was 2021 and he was not due for new machine. They will reach out to patient regarding message about motor life. - F/U in 1 year or sooner if needed     Butch Penny, MSN, NP-C 04/03/2022, 8:38 AM [facility] [address] [place], [place] [address] [phone]

## 2022-04-03 NOTE — Telephone Encounter (Signed)
spoke to [facility] they are not sure of start date .They have two different dates . [facility] states will reach out to patient today because of  message on CPAP machine . [facility] states just because it says exceeded motor life patient may have at Least 1000 hours left on CPAP machine

## 2022-04-04 ENCOUNTER — Other Ambulatory Visit: Payer: Self-pay

## 2022-04-04 DIAGNOSIS — E119 Type 2 diabetes mellitus without complications: Secondary | ICD-10-CM

## 2022-04-04 MED ORDER — METFORMIN HCL 1000 MG PO TABS: 1000.0000 mg | ORAL_TABLET | Freq: Every day | ORAL | 1 refills | Status: DC

## 2022-04-25 ENCOUNTER — Ambulatory Visit: Payer: 59 | Admitting: Family Medicine

## 2022-04-25 ENCOUNTER — Encounter: Payer: Self-pay | Admitting: Family Medicine

## 2022-04-25 VITALS — BP 120/86 | HR 82 | Temp 98.8°F | Ht 64.0 in | Wt 184.0 lb

## 2022-04-25 DIAGNOSIS — I10 Essential (primary) hypertension: Secondary | ICD-10-CM

## 2022-04-25 DIAGNOSIS — E119 Type 2 diabetes mellitus without complications: Secondary | ICD-10-CM | POA: Diagnosis not present

## 2022-04-25 DIAGNOSIS — Z23 Encounter for immunization: Secondary | ICD-10-CM

## 2022-04-25 LAB — LIPID PANEL
Cholesterol: 146 mg/dL (ref 0–200)
HDL: 50.9 mg/dL (ref >39.00)
LDL Cholesterol: 85 mg/dL (ref 0–99)
NonHDL: 95.45
Total CHOL/HDL Ratio: 3
Triglycerides: 51 mg/dL (ref 0.0–149.0)
VLDL: 10.2 mg/dL (ref 0.0–40.0)

## 2022-04-25 LAB — MICROALBUMIN / CREATININE URINE RATIO
Creatinine,U: 64.8 mg/dL
Microalb Creat Ratio: 1.1 mg/g (ref 0.0–30.0)
Microalb, Ur: <0.7 mg/dL (ref 0.0–1.9)

## 2022-04-25 LAB — COMPREHENSIVE METABOLIC PANEL
ALT: 24 U/L (ref 0–53)
AST: 20 U/L (ref 0–37)
Albumin: 4.5 g/dL (ref 3.5–5.2)
Alkaline Phosphatase: 54 U/L (ref 39–117)
BUN: 21 mg/dL (ref 6–23)
CO2: 28 mEq/L (ref 19–32)
Calcium: 10 mg/dL (ref 8.4–10.5)
Chloride: 102 mEq/L (ref 96–112)
Creatinine, Ser: 1.06 mg/dL (ref 0.40–1.50)
GFR: 83.07 mL/min (ref >60.00)
Glucose, Bld: 102 mg/dL — ABNORMAL HIGH (ref 70–99)
Potassium: 4.1 mEq/L (ref 3.5–5.1)
Sodium: 138 mEq/L (ref 135–145)
Total Bilirubin: 0.4 mg/dL (ref 0.2–1.2)
Total Protein: 7.6 g/dL (ref 6.0–8.3)

## 2022-04-25 LAB — HEMOGLOBIN A1C: Hgb A1c MFr Bld: 9.8 % — ABNORMAL HIGH (ref 4.6–6.5)

## 2022-04-25 MED ORDER — SPIRONOLACTONE 25 MG PO TABS: 25.0000 mg | ORAL_TABLET | Freq: Every day | ORAL | 3 refills | Status: DC

## 2022-04-25 MED ORDER — LABETALOL HCL 100 MG PO TABS: 100.0000 mg | ORAL_TABLET | Freq: Two times a day (BID) | ORAL | 1 refills | Status: DC

## 2022-04-25 MED ORDER — ATORVASTATIN CALCIUM 10 MG PO TABS: 10.0000 mg | ORAL_TABLET | Freq: Every day | ORAL | 0 refills | Status: DC

## 2022-04-25 NOTE — Patient Instructions (Addendum)
I will check labs today, but try different statin - lipitor once per week, then increase up to daily if tolerated. If side effects on that med return to discuss other options.  No other med changes today.  If any return of dizziness - be seen to look into causes.  Return to the clinic or go to the nearest emergency room if any of your symptoms worsen or new symptoms occur.

## 2022-04-25 NOTE — Progress Notes (Signed)
Subjective:  Patient ID: Ricky Diaz, male    DOB: Jun 15, 1976  Age: 46 y.o. 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  Last visit in January. On OSA for CPAP. Diabetes complicated by hyperglycemia in October 2022, but stable on January labs at 6.6. His metformin 1000 mg nightly, reported nausea/dizziness with daytime dosing at his last visit with Richard. Prior on statin, on ace-I. Stopped statin - ran out - but felt dizzy? Was taking in morning. Also noted with evening dose.  Recent DOT physical - needs updated labs and CPAP compliance report.  Home readings: Fasting: 108-125. No sx lows.  Postprandial: 93-125.  No med side effects.   Microalbumin: due.  Optho, foot exam, pneumovax:  Optho last year. Yearly follow up discussed.   Lab Results  Component Value Date   HGBA1C 6.6 (A) 08/01/2021   HGBA1C 11.1 (H) 04/25/2021   HGBA1C 6.9 (H) 04/08/2020   Lab Results  Component Value Date   LDLCALC 105 (H) 04/25/2021   CREATININE 1.05 04/25/2021   Hypertension: With OSA on CPAP. - has obtained compliance report for DOT. Uses nightly. Feels rested.  Lisinopril 10mg  qd. Amlodipine 10mg  qd. Labetalol 200mg  qd in am. Spirinolactone 25mg  qd. No lows.  Home readings: normal. 120/80 range.  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   OSA on CPAP 07/23/2012   Sleep  difficulties    Past Surgical History:  Procedure Laterality Date   NO PAST SURGERIES     Allergies  Allergen Reactions   Other    Prior to Admission medications   Medication Sig Start Date End Date Taking? Authorizing Provider  amLODipine (NORVASC) 10 MG tablet Take 1 tablet (10 mg total) by mouth daily. 08/01/21  Yes 08/08/2012, NP  labetalol (NORMODYNE) 200 MG tablet Take 1 tablet (200 mg total) by mouth daily. 08/01/21  Yes 09/20/2012, NP  lisinopril (ZESTRIL) 10 MG tablet Take 1 tablet (10 mg total) by mouth daily. 08/01/21  Yes Janeece Agee, NP  metFORMIN (GLUCOPHAGE) 1000 MG tablet Take 1 tablet (1,000 mg total) by mouth at bedtime. 1 tab at bedtime 04/04/22  Yes Janeece Agee, MD  spironolactone (ALDACTONE) 25 MG tablet Take 1 tablet (25 mg total) by mouth daily. 08/01/21  Yes Janeece Agee, NP  rosuvastatin (CRESTOR) 5 MG tablet Take 1 tablet (5 mg total) by mouth daily. Patient not taking: Reported on 04/25/2022 08/01/21   09/29/21, NP   Social History   Socioeconomic History   Marital status: Married    Spouse name: [name]   Number of children: 4   Years of education: 14   Highest education level: Not on file  Occupational History    Employer: [employer]  Tobacco Use   Smoking status: Never   Smokeless  tobacco: Never  Vaping Use   Vaping Use: Never used  Substance and Sexual Activity   Alcohol use: Not Currently    Comment: rare   Drug use: No   Sexual activity: Yes    Partners: Female  Other Topics Concern   Not on file  Social History Narrative   Marital status: married x 2007. From [place]; [place] since 2000.      Children: 4 children; no grandchildren.      Lives: with wife ([name]) , children.      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      Tobacco: none      Alcohol: none      Drugs: none      Exercise:  Sporadic.   Patient is right-handed.   Patient has a college education.   Patient no  longer drinks caffeine   Social Determinants of Health   Financial Resource Strain: Low Risk  (04/06/2019)   Overall Financial Resource Strain (CARDIA)    Difficulty of Paying Living Expenses: Not hard at all  Food Insecurity: No Food Insecurity (04/06/2019)   Hunger Vital Sign    Worried About Running Out of Food in the Last Year: Never true    Ran Out of Food in the Last Year: Never true  Transportation Needs: No Transportation Needs (04/06/2019)   PRAPARE - Hydrologist (Medical): No    Lack of Transportation (Non-Medical): No  Physical Activity: Insufficiently Active (04/06/2019)   Exercise Vital Sign    Days of Exercise per Week: 4 days    Minutes of Exercise per Session: 30 min  Stress: No Stress Concern Present (04/06/2019)   [place]    Feeling of Stress : Only a little  Social Connections: Unknown (04/06/2019)   Social Connection and Isolation Panel [NHANES]    Frequency of Communication with Friends and Family: Three times a week    Frequency of Social Gatherings with Friends and Family: Twice a week    Attends Religious Services: Patient refused    Active Member of Clubs or Organizations: Patient refused    Attends Archivist Meetings: Patient refused    Marital Status: Married  Human resources officer Violence: Not At Risk (04/06/2019)   Humiliation, Afraid, Rape, and Kick questionnaire    Fear of Current or Ex-Partner: No    Emotionally Abused: No    Physically Abused: No    Sexually Abused: No    Review of Systems  Constitutional:  Negative for fatigue and unexpected weight change.  Eyes:  Negative for visual disturbance.  Respiratory:  Negative for cough, chest tightness and shortness of breath.   Cardiovascular:  Negative for chest pain, palpitations and leg swelling.  Gastrointestinal:  Negative for abdominal pain and blood in stool.  Neurological:  Negative for  dizziness, light-headedness and headaches.     Objective:   Vitals:   04/25/22 0950  BP: 120/86  Pulse: 82  Temp: 98.8 F (37.1 C)  SpO2: 98%  Weight: 184 lb (83.5 kg)  Height: 5\' 4"  (1.626 m)     Physical Exam Vitals reviewed.  Constitutional:      Appearance: He is well-developed.  HENT:     Head: Normocephalic and atraumatic.  Neck:     Vascular: No carotid bruit or JVD.  Cardiovascular:     Rate and Rhythm: Normal rate and regular rhythm.     Heart sounds:  Normal heart sounds. No murmur heard. Pulmonary:     Effort: Pulmonary effort is normal.     Breath sounds: Normal breath sounds. No rales.  Musculoskeletal:     Right lower leg: No edema.     Left lower leg: No edema.  Skin:    General: Skin is warm and dry.  Neurological:     Mental Status: He is alert and oriented to person, place, and time.  Psychiatric:        Mood and Affect: Mood normal.        Assessment & Plan:  Ricky Diaz is a 46 y.o. male . Type 2 diabetes mellitus without complication, without long-term current use of insulin (HCC) - Plan: Hemoglobin A1c, atorvastatin (LIPITOR) 10 MG tablet, Microalbumin / creatinine urine ratio, Comprehensive metabolic panel, Lipid panel  - Stable, tolerating current regimen. Medications recently  refilled. Labs pending as above.   -Trial of different statin, initially Lipitor once per week with increasing doses as tolerated towards daily dosing.  RTC precautions if new side effects.  Need for influenza vaccination - Plan: Flu Vaccine QUAD 6+ mos PF IM (Fluarix Quad PF)  Essential hypertension - Plan: spironolactone (ALDACTONE) 25 MG tablet, labetalol (NORMODYNE) 100 MG tablet, Comprehensive metabolic panel  -Stable on current regimen, discussed typical dosing of labetalol -  twice daily dosing, changed from 200 mg to 100 mg to reflect that dosing.  RTC precautions if new side effects or uncontrolled readings on these changes.  Meds ordered this  encounter  Medications   atorvastatin (LIPITOR) 10 MG tablet    Sig: Take 1 tablet (10 mg total) by mouth daily. Initially once per week and increase to daily as tolerated.    Dispense:  90 tablet    Refill:  0   spironolactone (ALDACTONE) 25 MG tablet    Sig: Take 1 tablet (25 mg total) by mouth daily.    Dispense:  90 tablet    Refill:  3   labetalol (NORMODYNE) 100 MG tablet    Sig: Take 1 tablet (100 mg total) by mouth 2 (two) times daily.    Dispense:  180 tablet    Refill:  1   Patient Instructions  I will check labs today, but try different statin - lipitor once per week, then increase up to daily if tolerated. If side effects on that med return to discuss other options.  No other med changes today.  If any return of dizziness - be seen to look into causes.  Return to the clinic or go to the nearest emergency room if any of your symptoms worsen or new symptoms occur.     Signed,   [name], MD [facility], [facility] 04/25/22 11:06 AM

## 2022-04-27 ENCOUNTER — Encounter: Payer: 59 | Admitting: Registered Nurse

## 2022-04-30 ENCOUNTER — Encounter: Payer: 59 | Admitting: Registered Nurse

## 2022-05-01 ENCOUNTER — Telehealth: Payer: Self-pay | Admitting: Registered Nurse

## 2022-05-01 NOTE — Telephone Encounter (Signed)
I have already see phone note

## 2022-05-01 NOTE — Telephone Encounter (Signed)
Caller name: Kasson  On DPR? :yes/no: Yes  Call back number: [phone]  Provider they see: Maximiano Coss  Reason for call: calling to check on lab results from 04/25/22. Specifically A1C

## 2022-05-01 NOTE — Progress Notes (Signed)
Informed pt  to keep taking his Metformin as directed and Dr Carlota Raspberry will recheck in 3 months . Pt states he is dropping off a medical card for Dr Carlota Raspberry to sign for his employment with [employer] for his diabetes

## 2022-05-14 ENCOUNTER — Ambulatory Visit: Payer: 59 | Admitting: Adult Health

## 2022-07-09 ENCOUNTER — Encounter: Payer: Self-pay | Admitting: Family Medicine

## 2022-07-09 ENCOUNTER — Ambulatory Visit (INDEPENDENT_AMBULATORY_CARE_PROVIDER_SITE_OTHER): Payer: 59 | Admitting: Family Medicine

## 2022-07-09 VITALS — BP 124/84 | HR 72 | Temp 99.0°F | Ht 64.0 in | Wt 184.8 lb

## 2022-07-09 DIAGNOSIS — G4733 Obstructive sleep apnea (adult) (pediatric): Secondary | ICD-10-CM | POA: Diagnosis not present

## 2022-07-09 DIAGNOSIS — E119 Type 2 diabetes mellitus without complications: Secondary | ICD-10-CM

## 2022-07-09 DIAGNOSIS — N529 Male erectile dysfunction, unspecified: Secondary | ICD-10-CM | POA: Diagnosis not present

## 2022-07-09 DIAGNOSIS — I10 Essential (primary) hypertension: Secondary | ICD-10-CM

## 2022-07-09 MED ORDER — AMLODIPINE BESYLATE 10 MG PO TABS: 10.0000 mg | ORAL_TABLET | Freq: Every day | ORAL | 3 refills | Status: DC

## 2022-07-09 MED ORDER — LISINOPRIL 10 MG PO TABS: 10.0000 mg | ORAL_TABLET | Freq: Every day | ORAL | 3 refills | Status: DC

## 2022-07-09 MED ORDER — LABETALOL HCL 100 MG PO TABS: 100.0000 mg | ORAL_TABLET | Freq: Two times a day (BID) | ORAL | 1 refills | Status: DC

## 2022-07-09 MED ORDER — ATORVASTATIN CALCIUM 10 MG PO TABS: 10.0000 mg | ORAL_TABLET | Freq: Every day | ORAL | 0 refills | Status: DC

## 2022-07-09 MED ORDER — METFORMIN HCL 1000 MG PO TABS: 1000.0000 mg | ORAL_TABLET | Freq: Every day | ORAL | 1 refills | Status: DC

## 2022-07-09 NOTE — Patient Instructions (Addendum)
Try setting alarm for your metformin nightly to help remember it.  Continue cholesterol pill daily.  Cut back on soda and sweet tea - water is best.  Lab visit in 3 weeks. Depending on results we can decide on med changes.  Appointment with me in 4 months.  See info below but let me know if you would like to try a medication like Viagra. Treating blood pressure and diabetes is important as that can affect circulation as well.   Erectile Dysfunction Erectile dysfunction (ED) is the inability to get or keep an erection in order to have sexual intercourse. ED is considered a symptom of an underlying disorder and is not considered a disease. ED may include: Inability to get an erection. Lack of enough hardness of the erection to allow penetration. Loss of erection before sex is finished. What are the causes? This condition may be caused by: Physical causes, such as: Artery problems. This may include heart disease, high blood pressure, atherosclerosis, and diabetes. Hormonal problems, such as low testosterone. Obesity. Nerve problems. This may include back or pelvic injuries, multiple sclerosis, Parkinson's disease, spinal cord injury, and stroke. Certain medicines, such as: Pain relievers. Antidepressants. Blood pressure medicines and water pills (diuretics). Cancer medicines. Antihistamines. Muscle relaxants. Lifestyle factors, such as: Use of drugs such as marijuana, cocaine, or opioids. Excessive use of alcohol. Smoking. Lack of physical activity or exercise. Psychological causes, such as: Anxiety or stress. Sadness or depression. Exhaustion. Fear about sexual performance. Guilt. What are the signs or symptoms? Symptoms of this condition include: Inability to get an erection. Lack of enough hardness of the erection to allow penetration. Loss of the erection before sex is finished. Sometimes having normal erections, but with frequent unsatisfactory episodes. Low sexual  satisfaction in either partner due to erection problems. A curved penis occurring with erection. The curve may cause pain, or the penis may be too curved to allow for intercourse. Never having nighttime or morning erections. How is this diagnosed? This condition is often diagnosed by: Performing a physical exam to find other diseases or specific problems with the penis. Asking you detailed questions about the problem. Doing tests, such as: Blood tests to check for diabetes mellitus or high cholesterol, or to measure hormone levels. Other tests to check for underlying health conditions. An ultrasound exam to check for scarring. A test to check blood flow to the penis. Doing a sleep study at home to measure nighttime erections. How is this treated? This condition may be treated by: Medicines, such as: Medicine taken by mouth to help you achieve an erection (oral medicine). Hormone replacement therapy to replace low testosterone levels. Medicine that is injected into the penis. Your health care provider may instruct you how to give yourself these injections at home. Medicine that is delivered with a short applicator tube. The tube is inserted into the opening at the tip of the penis, which is the opening of the urethra. A tiny pellet of medicine is put in the urethra. The pellet dissolves and enhances erectile function. This is also called MUSE (medicated urethral system for erections) therapy. Vacuum pump. This is a pump with a ring on it. The pump and ring are placed on the penis and used to create pressure that helps the penis become erect. Penile implant surgery. In this procedure, you may receive: An inflatable implant. This consists of cylinders, a pump, and a reservoir. The cylinders can be inflated with a fluid that helps to create an erection, and  they can be deflated after intercourse. A semi-rigid implant. This consists of two silicone rubber rods. The rods provide some rigidity. They  are also flexible, so the penis can both curve downward in its normal position and become straight for sexual intercourse. Blood vessel surgery to improve blood flow to the penis. During this procedure, a blood vessel from a different part of the body is placed into the penis to allow blood to flow around (bypass) damaged or blocked blood vessels. Lifestyle changes, such as exercising more, losing weight, and quitting smoking. Follow these instructions at home: Medicines  Take over-the-counter and prescription medicines only as told by your health care provider. Do not increase the dosage without first discussing it with your health care provider. If you are using self-injections, do injections as directed by your health care provider. Make sure you avoid any veins that are on the surface of the penis. After giving an injection, apply pressure to the injection site for 5 minutes. Talk to your health care provider about how to prevent headaches while taking ED medicines. These medicines may cause a sudden headache due to the increase in blood flow in your body. General instructions Exercise regularly, as directed by your health care provider. Work with your health care provider to lose weight, if needed. Do not use any products that contain nicotine or tobacco. These products include cigarettes, chewing tobacco, and vaping devices, such as e-cigarettes. If you need help quitting, ask your health care provider. Before using a vacuum pump, read the instructions that come with the pump and discuss any questions with your health care provider. Keep all follow-up visits. This is important. Contact a health care provider if: You feel nauseous. You are vomiting. You get sudden headaches while taking ED medicines. You have any concerns about your sexual health. Get help right away if: You are taking oral or injectable medicines and you have an erection that lasts longer than 4 hours. If your health care  provider is unavailable, go to the nearest emergency room for evaluation. An erection that lasts much longer than 4 hours can result in permanent damage to your penis. You have severe pain in your groin or abdomen. You develop redness or severe swelling of your penis. You have redness spreading at your groin or lower abdomen. You are unable to urinate. You experience chest pain or a rapid heartbeat (palpitations) after taking oral medicines. These symptoms may represent a serious problem that is an emergency. Do not wait to see if the symptoms will go away. Get medical help right away. Call your local emergency services (911 in the [place] Do not drive yourself to the hospital. Summary Erectile dysfunction (ED) is the inability to get or keep an erection during sexual intercourse. This condition is diagnosed based on a physical exam, your symptoms, and tests to determine the cause. Treatment varies depending on the cause and may include medicines, hormone therapy, surgery, or a vacuum pump. You may need follow-up visits to make sure that you are using your medicines or devices correctly. Get help right away if you are taking or injecting medicines and you have an erection that lasts longer than 4 hours. This information is not intended to replace advice given to you by your health care provider. Make sure you discuss any questions you have with your health care provider. Document Revised: 10/05/2020 Document Reviewed: 10/05/2020 Elsevier Patient Education  2023 [employer].

## 2022-07-09 NOTE — Progress Notes (Signed)
Subjective:  Patient ID: Ricky Diaz, male    DOB: [birth year]  Age: 46 y.o. MRN: [id number]  CC:  Chief Complaint  Patient presents with   Diabetes   Hypertension    Pt states all is well    HPI Ricky Diaz presents for   Diabetes: Last discussed October 4.  Metformin 1000 mg nightly.  Nausea/dizziness with daytime dosing. Possible side effects with previous statin, started on lipitor 10 mg with initial intermittent dosing at his October 4 visit. - taking daily without side effects.  A1c was elevated prior but blood sugar in the office was 102 and his electrolytes looked okay.  He has been out of his meds for a month.  Continued on same dosing with plan for recheck labs. Taking metformin 1000mg  at night. Some missed days - about once per week. Some trouble with erections at times - only on occasion.  Both onset and maintenance.  Concerned it may by d/t metformin.  No CP with exertion or intercourse. No prior  Sodas - 2-3 per day. 1 sweet tea per day - plans to stop these. Home readings 95 -111.   Microalbumin: Normal ratio 04/25/2022 Optho, foot exam, pneumovax:   Lab Results  Component Value Date   HGBA1C 9.8 (H) 04/25/2022   HGBA1C 6.6 (A) 08/01/2021   HGBA1C 11.1 (H) 04/25/2021   Lab Results  Component Value Date   MICROALBUR <0.7 04/25/2022   LDLCALC 85 04/25/2022   CREATININE 1.06 04/25/2022   Hypertension: With OSA on CPAP, hyperaldosteronism.  Labetalol dosing intervals adjusted last visit from 200 mg to 100 mg twice daily.  Continued on lisinopril 10 mg daily, amlodipine 10 mg daily and spironolactone 25 mg daily.no new side effects with meds.  Home readings: 125/81, using CPAP daily. Feels good/well rested.   BP Readings from Last 3 Encounters:  07/09/22 124/84  04/25/22 120/86  04/03/22 (!) 139/95   Lab Results  Component Value Date   CREATININE 1.06 04/25/2022   Lab Results  Component Value Date   K 4.1 04/25/2022   History Patient Active  Problem List   Diagnosis Date Noted   Primary hyperaldosteronism (HCC) 06/23/2015   Sleep difficulties    Hypokalemia 06/09/2014   HTN (hypertension) 08/06/2012   Past Medical History:  Diagnosis Date   Glucose intolerance (impaired glucose tolerance)    Hyperlipidemia    Hypertension    onset age 50.   OSA on CPAP 07/23/2012   Sleep difficulties    Past Surgical History:  Procedure Laterality Date   NO PAST SURGERIES     Allergies  Allergen Reactions   Other    Prior to Admission medications   Medication Sig Start Date End Date Taking? Authorizing Provider  amLODipine (NORVASC) 10 MG tablet Take 1 tablet (10 mg total) by mouth daily. 08/01/21  Yes 09/29/21, NP  atorvastatin (LIPITOR) 10 MG tablet Take 1 tablet (10 mg total) by mouth daily. Initially once per week and increase to daily as tolerated. 04/25/22  Yes 06/25/22, MD  labetalol (NORMODYNE) 100 MG tablet Take 1 tablet (100 mg total) by mouth 2 (two) times daily. 04/25/22  Yes 06/25/22, MD  lisinopril (ZESTRIL) 10 MG tablet Take 1 tablet (10 mg total) by mouth daily. 08/01/21  Yes 09/29/21, NP  metFORMIN (GLUCOPHAGE) 1000 MG tablet Take 1 tablet (1,000 mg total) by mouth at bedtime. 1 tab at bedtime 04/04/22  Yes 04/06/22, MD  spironolactone (ALDACTONE) 25 MG  tablet Take 1 tablet (25 mg total) by mouth daily. 04/25/22  Yes Shade FloodGreene, Garen Woolbright R, MD   Social History   Socioeconomic History   Marital status: Married    Spouse name: [name]   Number of children: 4   Years of education: 14   Highest education level: Not on file  Occupational History    Employer: [employer]  Tobacco Use   Smoking status: Never   Smokeless tobacco: Never  Vaping Use   Vaping Use: Never used  Substance and Sexual Activity   Alcohol use: Not Currently    Comment: rare   Drug use: No   Sexual activity: Yes    Partners: Female  Other Topics Concern   Not on file  Social History  Narrative   Marital status: married x 2007. From [place] since [place].      Children: 4 children; no grandchildren.      Lives: with wife ([name]) , children.      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      Tobacco: none      Alcohol: none      Drugs: none      Exercise:  Sporadic.   Patient is right-handed.   Patient has a college education.   Patient no longer drinks caffeine   Social Determinants of Health   Financial Resource Strain: Low Risk  (04/06/2019)   Overall Financial Resource Strain (CARDIA)    Difficulty of Paying Living Expenses: Not hard at all  Food Insecurity: No Food Insecurity (04/06/2019)   Hunger Vital Sign    Worried About Running Out of Food in the Last Year: Never true    Ran Out of Food in the Last Year: Never true  Transportation Needs: No Transportation Needs (04/06/2019)   PRAPARE - Administrator, Civil ServiceTransportation    Lack of Transportation (Medical): No    Lack of Transportation (Non-Medical): No  Physical Activity: Insufficiently Active (04/06/2019)   Exercise Vital Sign    Days of Exercise per Week: 4 days    Minutes of Exercise per Session: 30 min  Stress: No Stress Concern Present (04/06/2019)   [employer] - Occupational Stress Questionnaire    Feeling of Stress : Only a little  Social Connections: Unknown (04/06/2019)   Social Connection and Isolation Panel [NHANES]    Frequency of Communication with Friends and Family: Three times a week    Frequency of Social Gatherings with Friends and Family: Twice a week    Attends Religious Services: Patient refused    Active Member of Clubs or Organizations: Patient refused    Attends BankerClub or Organization Meetings: Patient refused    Marital Status: Married  Catering managerntimate Partner Violence: Not At Risk (04/06/2019)   Humiliation, Afraid, Rape, and Kick questionnaire    Fear of Current or Ex-Partner: No    Emotionally Abused: No    Physically Abused: No    Sexually Abused:  No    Review of Systems  Constitutional:  Negative for fatigue and unexpected weight change.  Eyes:  Negative for visual disturbance.  Respiratory:  Negative for cough, chest tightness and shortness of breath.   Cardiovascular:  Negative for chest pain, palpitations and leg swelling.  Gastrointestinal:  Negative for abdominal pain and blood in stool.  Neurological:  Negative for dizziness, light-headedness and headaches.     Objective:   Vitals:   07/09/22 0953  BP: 124/84  Pulse: 72  Temp: 99 F (  37.2 C)  SpO2: 97%  Weight: 184 lb 12.8 oz (83.8 kg)  Height: 5\' 4"  (1.626 m)     Physical Exam Vitals reviewed.  Constitutional:      Appearance: He is well-developed.  HENT:     Head: Normocephalic and atraumatic.  Neck:     Vascular: No carotid bruit or JVD.  Cardiovascular:     Rate and Rhythm: Normal rate and regular rhythm.     Heart sounds: Normal heart sounds. No murmur heard. Pulmonary:     Effort: Pulmonary effort is normal.     Breath sounds: Normal breath sounds. No rales.  Musculoskeletal:     Right lower leg: No edema.     Left lower leg: No edema.  Skin:    General: Skin is warm and dry.  Neurological:     Mental Status: He is alert and oriented to person, place, and time.  Psychiatric:        Mood and Affect: Mood normal.        Assessment & Plan:  Ferdinand Yadav is a 46 y.o. male . Type 2 diabetes mellitus without complication, without long-term current use of insulin (HCC) - Plan: atorvastatin (LIPITOR) 10 MG tablet, metFORMIN (GLUCOPHAGE) 1000 MG tablet, Comprehensive metabolic panel, Lipid panel, Hemoglobin A1c  -Uncontrolled by last A1c.  Home readings look okay and had missed medication previously.  Not quite due for A1c yet, lab only visit in 3 to 4 weeks.  Medication adjustment as needed at that time.  Stressed importance of avoiding sugar-containing beverages.  67-month follow-up  Essential hypertension - Plan: labetalol (NORMODYNE) 100  MG tablet, amLODipine (NORVASC) 10 MG tablet, lisinopril (ZESTRIL) 10 MG tablet, Comprehensive metabolic panel, Lipid panel  -With hyperaldosteronism, check labs next few weeks.  Tolerating current regimen, continue same.  Erectile dysfunction, unspecified erectile dysfunction type  -New issue, unlikely metformin cause.  Discussed control of hypertension, diabetes for steroids.  Handout given, option of low-dose Viagra with potential side effects and risks discussed.  Declined at this time.  He will let me know if you would like to try it.  RTC precautions.  OSA on CPAP Stable, compliant.  Meds ordered this encounter  Medications   atorvastatin (LIPITOR) 10 MG tablet    Sig: Take 1 tablet (10 mg total) by mouth daily. Initially once per week and increase to daily as tolerated.    Dispense:  90 tablet    Refill:  0   labetalol (NORMODYNE) 100 MG tablet    Sig: Take 1 tablet (100 mg total) by mouth 2 (two) times daily.    Dispense:  180 tablet    Refill:  1   metFORMIN (GLUCOPHAGE) 1000 MG tablet    Sig: Take 1 tablet (1,000 mg total) by mouth at bedtime. 1 tab at bedtime    Dispense:  90 tablet    Refill:  1   amLODipine (NORVASC) 10 MG tablet    Sig: Take 1 tablet (10 mg total) by mouth daily.    Dispense:  90 tablet    Refill:  3   lisinopril (ZESTRIL) 10 MG tablet    Sig: Take 1 tablet (10 mg total) by mouth daily.    Dispense:  90 tablet    Refill:  3   Patient Instructions  Try setting alarm for your metformin nightly to help remember it.  Continue cholesterol pill daily.  Cut back on soda and sweet tea - water is best.  Lab visit in 3 weeks.  Depending on results we can decide on med changes.  Appointment with me in 4 months.  See info below but let me know if you would like to try a medication like Viagra. Treating blood pressure and diabetes is important as that can affect circulation as well.   Erectile Dysfunction Erectile dysfunction (ED) is the inability to get or  keep an erection in order to have sexual intercourse. ED is considered a symptom of an underlying disorder and is not considered a disease. ED may include: Inability to get an erection. Lack of enough hardness of the erection to allow penetration. Loss of erection before sex is finished. What are the causes? This condition may be caused by: Physical causes, such as: Artery problems. This may include heart disease, high blood pressure, atherosclerosis, and diabetes. Hormonal problems, such as low testosterone. Obesity. Nerve problems. This may include back or pelvic injuries, multiple sclerosis, Parkinson's disease, spinal cord injury, and stroke. Certain medicines, such as: Pain relievers. Antidepressants. Blood pressure medicines and water pills (diuretics). Cancer medicines. Antihistamines. Muscle relaxants. Lifestyle factors, such as: Use of drugs such as marijuana, cocaine, or opioids. Excessive use of alcohol. Smoking. Lack of physical activity or exercise. Psychological causes, such as: Anxiety or stress. Sadness or depression. Exhaustion. Fear about sexual performance. Guilt. What are the signs or symptoms? Symptoms of this condition include: Inability to get an erection. Lack of enough hardness of the erection to allow penetration. Loss of the erection before sex is finished. Sometimes having normal erections, but with frequent unsatisfactory episodes. Low sexual satisfaction in either partner due to erection problems. A curved penis occurring with erection. The curve may cause pain, or the penis may be too curved to allow for intercourse. Never having nighttime or morning erections. How is this diagnosed? This condition is often diagnosed by: Performing a physical exam to find other diseases or specific problems with the penis. Asking you detailed questions about the problem. Doing tests, such as: Blood tests to check for diabetes mellitus or high cholesterol, or  to measure hormone levels. Other tests to check for underlying health conditions. An ultrasound exam to check for scarring. A test to check blood flow to the penis. Doing a sleep study at home to measure nighttime erections. How is this treated? This condition may be treated by: Medicines, such as: Medicine taken by mouth to help you achieve an erection (oral medicine). Hormone replacement therapy to replace low testosterone levels. Medicine that is injected into the penis. Your health care provider may instruct you how to give yourself these injections at home. Medicine that is delivered with a short applicator tube. The tube is inserted into the opening at the tip of the penis, which is the opening of the urethra. A tiny pellet of medicine is put in the urethra. The pellet dissolves and enhances erectile function. This is also called MUSE (medicated urethral system for erections) therapy. Vacuum pump. This is a pump with a ring on it. The pump and ring are placed on the penis and used to create pressure that helps the penis become erect. Penile implant surgery. In this procedure, you may receive: An inflatable implant. This consists of cylinders, a pump, and a reservoir. The cylinders can be inflated with a fluid that helps to create an erection, and they can be deflated after intercourse. A semi-rigid implant. This consists of two silicone rubber rods. The rods provide some rigidity. They are also flexible, so the penis can both curve downward in  its normal position and become straight for sexual intercourse. Blood vessel surgery to improve blood flow to the penis. During this procedure, a blood vessel from a different part of the body is placed into the penis to allow blood to flow around (bypass) damaged or blocked blood vessels. Lifestyle changes, such as exercising more, losing weight, and quitting smoking. Follow these instructions at home: Medicines  Take over-the-counter and  prescription medicines only as told by your health care provider. Do not increase the dosage without first discussing it with your health care provider. If you are using self-injections, do injections as directed by your health care provider. Make sure you avoid any veins that are on the surface of the penis. After giving an injection, apply pressure to the injection site for 5 minutes. Talk to your health care provider about how to prevent headaches while taking ED medicines. These medicines may cause a sudden headache due to the increase in blood flow in your body. General instructions Exercise regularly, as directed by your health care provider. Work with your health care provider to lose weight, if needed. Do not use any products that contain nicotine or tobacco. These products include cigarettes, chewing tobacco, and vaping devices, such as e-cigarettes. If you need help quitting, ask your health care provider. Before using a vacuum pump, read the instructions that come with the pump and discuss any questions with your health care provider. Keep all follow-up visits. This is important. Contact a health care provider if: You feel nauseous. You are vomiting. You get sudden headaches while taking ED medicines. You have any concerns about your sexual health. Get help right away if: You are taking oral or injectable medicines and you have an erection that lasts longer than 4 hours. If your health care provider is unavailable, go to the nearest emergency room for evaluation. An erection that lasts much longer than 4 hours can result in permanent damage to your penis. You have severe pain in your groin or abdomen. You develop redness or severe swelling of your penis. You have redness spreading at your groin or lower abdomen. You are unable to urinate. You experience chest pain or a rapid heartbeat (palpitations) after taking oral medicines. These symptoms may represent a serious problem that is an  emergency. Do not wait to see if the symptoms will go away. Get medical help right away. Call your local emergency services (911 in the [place] Do not drive yourself to the hospital. Summary Erectile dysfunction (ED) is the inability to get or keep an erection during sexual intercourse. This condition is diagnosed based on a physical exam, your symptoms, and tests to determine the cause. Treatment varies depending on the cause and may include medicines, hormone therapy, surgery, or a vacuum pump. You may need follow-up visits to make sure that you are using your medicines or devices correctly. Get help right away if you are taking or injecting medicines and you have an erection that lasts longer than 4 hours. This information is not intended to replace advice given to you by your health care provider. Make sure you discuss any questions you have with your health care provider. Document Revised: 10/05/2020 Document Reviewed: 10/05/2020 Elsevier Patient Education  2023 [employer]       Signed,   [name], MD [facility], [facility] 07/09/22 10:58 AM

## 2022-07-24 ENCOUNTER — Other Ambulatory Visit (INDEPENDENT_AMBULATORY_CARE_PROVIDER_SITE_OTHER): Payer: 59

## 2022-07-24 ENCOUNTER — Telehealth: Payer: Self-pay

## 2022-07-24 DIAGNOSIS — E119 Type 2 diabetes mellitus without complications: Secondary | ICD-10-CM

## 2022-07-24 DIAGNOSIS — I10 Essential (primary) hypertension: Secondary | ICD-10-CM

## 2022-07-24 LAB — COMPREHENSIVE METABOLIC PANEL
ALT: 71 U/L — ABNORMAL HIGH (ref 0–53)
AST: 27 U/L (ref 0–37)
Albumin: 4.5 g/dL (ref 3.5–5.2)
Alkaline Phosphatase: 69 U/L (ref 39–117)
BUN: 13 mg/dL (ref 6–23)
CO2: 29 mEq/L (ref 19–32)
Calcium: 10.4 mg/dL (ref 8.4–10.5)
Chloride: 101 mEq/L (ref 96–112)
Creatinine, Ser: 1.11 mg/dL (ref 0.40–1.50)
GFR: 78.47 mL/min (ref >60.00)
Glucose, Bld: 126 mg/dL — ABNORMAL HIGH (ref 70–99)
Potassium: 4.2 mEq/L (ref 3.5–5.1)
Sodium: 137 mEq/L (ref 135–145)
Total Bilirubin: 0.4 mg/dL (ref 0.2–1.2)
Total Protein: 7.6 g/dL (ref 6.0–8.3)

## 2022-07-24 LAB — LIPID PANEL
Cholesterol: 135 mg/dL (ref 0–200)
HDL: 49 mg/dL (ref >39.00)
LDL Cholesterol: 64 mg/dL (ref 0–99)
NonHDL: 86.47
Total CHOL/HDL Ratio: 3
Triglycerides: 114 mg/dL (ref 0.0–149.0)
VLDL: 22.8 mg/dL (ref 0.0–40.0)

## 2022-07-24 LAB — HEMOGLOBIN A1C: Hgb A1c MFr Bld: 7.2 % — ABNORMAL HIGH (ref 4.6–6.5)

## 2022-07-24 NOTE — Telephone Encounter (Signed)
Informed pt of lab results  

## 2022-07-24 NOTE — Telephone Encounter (Signed)
-----   Message from Wendie Agreste, MD sent at 07/24/2022  3:13 PM EST ----- Lab letter  A1c was better than 3 months ago, near goal.  Cholesterol levels looked okay, one of the liver tests was borderline elevated, but other electrolytes looked okay.  Can recheck at future visit.  Let me know if you have questions.  Dr. Carlota Raspberry

## 2022-10-09 ENCOUNTER — Other Ambulatory Visit: Payer: Self-pay | Admitting: Family Medicine

## 2022-10-09 DIAGNOSIS — E119 Type 2 diabetes mellitus without complications: Secondary | ICD-10-CM

## 2022-10-15 ENCOUNTER — Telehealth: Payer: Self-pay | Admitting: Adult Health

## 2022-10-15 DIAGNOSIS — G4733 Obstructive sleep apnea (adult) (pediatric): Secondary | ICD-10-CM

## 2022-10-15 NOTE — Telephone Encounter (Signed)
Pt states he has reached out to DME [facility]) he is being told they has not received anything re: his need for CPAP and supplies, please call

## 2022-10-16 NOTE — Telephone Encounter (Addendum)
I returned call to pt. He is to call me back.  I see from [name] (he has 2 accounts, same serial #.  Set up 08-22-2015 with [facility] pt. Then there is [facility] 11-26-2017.   Serial # [id number].

## 2022-10-17 NOTE — Addendum Note (Signed)
Addended by: Brandon Melnick on: 10/17/2022 02:14 PM   Modules accepted: Orders

## 2022-10-17 NOTE — Telephone Encounter (Signed)
I called pt and he will bring his machine tomorrow morning to get a more recent DL.  Will fax this to [place].

## 2022-10-17 NOTE — Telephone Encounter (Signed)
I called [facility], she was able verify that pt is eligible to get a new machine.  Need order, compliance and f3f notes. (Last seen 03-2022). The order was done.

## 2022-10-17 NOTE — Addendum Note (Signed)
Addended by: Brandon Melnick on: 10/17/2022 03:40 PM   Modules accepted: Orders

## 2022-11-12 ENCOUNTER — Ambulatory Visit: Payer: 59 | Admitting: Family Medicine

## 2022-11-12 ENCOUNTER — Encounter: Payer: Self-pay | Admitting: Family Medicine

## 2022-11-12 VITALS — BP 118/78 | HR 86 | Temp 97.9°F | Ht 64.0 in | Wt 188.6 lb

## 2022-11-12 DIAGNOSIS — I10 Essential (primary) hypertension: Secondary | ICD-10-CM | POA: Diagnosis not present

## 2022-11-12 DIAGNOSIS — E119 Type 2 diabetes mellitus without complications: Secondary | ICD-10-CM

## 2022-11-12 DIAGNOSIS — R7989 Other specified abnormal findings of blood chemistry: Secondary | ICD-10-CM

## 2022-11-12 LAB — COMPREHENSIVE METABOLIC PANEL
ALT: 38 U/L (ref 0–53)
AST: 22 U/L (ref 0–37)
Albumin: 4.5 g/dL (ref 3.5–5.2)
Alkaline Phosphatase: 63 U/L (ref 39–117)
BUN: 12 mg/dL (ref 6–23)
CO2: 26 mEq/L (ref 19–32)
Calcium: 9.8 mg/dL (ref 8.4–10.5)
Chloride: 103 mEq/L (ref 96–112)
Creatinine, Ser: 1.02 mg/dL (ref 0.40–1.50)
GFR: 86.66 mL/min (ref >60.00)
Glucose, Bld: 135 mg/dL — ABNORMAL HIGH (ref 70–99)
Potassium: 4.2 mEq/L (ref 3.5–5.1)
Sodium: 136 mEq/L (ref 135–145)
Total Bilirubin: 0.2 mg/dL (ref 0.2–1.2)
Total Protein: 7.5 g/dL (ref 6.0–8.3)

## 2022-11-12 LAB — HEMOGLOBIN A1C: Hgb A1c MFr Bld: 7.7 % — ABNORMAL HIGH (ref 4.6–6.5)

## 2022-11-12 MED ORDER — ATORVASTATIN CALCIUM 10 MG PO TABS: ORAL_TABLET | ORAL | 1 refills | Status: DC

## 2022-11-12 MED ORDER — METFORMIN HCL 500 MG PO TABS
500.0000 mg | ORAL_TABLET | Freq: Every day | ORAL | 2 refills | Status: DC
Start: 2022-11-12 — End: 2023-08-12

## 2022-11-12 MED ORDER — LABETALOL HCL 100 MG PO TABS: 100.0000 mg | ORAL_TABLET | Freq: Two times a day (BID) | ORAL | 1 refills | Status: DC

## 2022-11-12 NOTE — Patient Instructions (Signed)
Try lower dose of metformin at 500 mg once per day.  Watch blood sugars and if those start running higher including any upper 100s or 200s, let me know and we can change dosing.  I will check labs today.  92-month follow-up.  No other med changes for now.  Take care.

## 2022-11-12 NOTE — Progress Notes (Addendum)
Subjective:  Patient ID: Ricky Diaz, male    DOB: 1976-05-04  Age: 47 y.o. MRN: [id number]  CC:  Chief Complaint  Patient presents with   Hypertension    Pt states no concerns     HPI Ricky Diaz presents for   Diabetes: With hyperglycemia.  Improving A1c in January, near goal.  Borderline elevated LFT. Metformin 1000 mg at night with episodic missed doses when discussed in December.  Tolerating daily Lipitor at that time.  Has had nausea/dizziness with daytime dosing of metformin previously.  Avoidance of sugar containing beverages discussed previously. Home readings:  Fasting:79-90 Postprandial:95-110  Few weeks ago low of 59, 64. Stopped metformin at that time. Intermittent dosing metformin - only taking when in 90's - 3-4 days per week.  Microalbumin: nl ratio 04/25/22.  Optho, foot exam, pneumovax: utd.   Wt Readings from Last 3 Encounters:  11/12/22 188 lb 9.6 oz (85.5 kg)  07/09/22 184 lb 12.8 oz (83.8 kg)  04/25/22 184 lb (83.5 kg)    Lab Results  Component Value Date   HGBA1C 7.2 (H) 07/24/2022   HGBA1C 9.8 (H) 04/25/2022   HGBA1C 6.6 (A) 08/01/2021   Lab Results  Component Value Date   MICROALBUR <0.7 04/25/2022   LDLCALC 64 07/24/2022   CREATININE 1.11 07/24/2022   Hypertension: With hyperaldosteronism.  Treated with labetalol, amlodipine, lisinopril, spironolactone.  No new medication side effects or missed doses.  Home readings: none.  BP Readings from Last 3 Encounters:  11/12/22 118/78  07/09/22 124/84  04/25/22 120/86   Lab Results  Component Value Date   CREATININE 1.11 07/24/2022    Hyperlipidemia: Lipitor 10 mg daily without new arthralgia/myalgia, daily dosing.  Stable LDL few months ago.  Mild elevated ALT at 71 on last labs, up from 24 previously but AST was normal.  Plan for repeat today. No n/v/abd pain/jaundice/scleral icterus. No regular alcohol.  Lab Results  Component Value Date   CHOL 135 07/24/2022   HDL 49.00  07/24/2022   LDLCALC 64 07/24/2022   TRIG 114.0 07/24/2022   CHOLHDL 3 07/24/2022   Lab Results  Component Value Date   ALT 71 (H) 07/24/2022   AST 27 07/24/2022   ALKPHOS 69 07/24/2022   BILITOT 0.4 07/24/2022      History Patient Active Problem List   Diagnosis Date Noted   Type 2 diabetes mellitus without complication, without long-term current use of insulin 11/12/2022   LFT elevation 11/12/2022   Primary hyperaldosteronism 06/23/2015   Sleep difficulties    Hypokalemia 06/09/2014   Essential hypertension 08/06/2012    Past Medical History:  Diagnosis Date   Glucose intolerance (impaired glucose tolerance)    Hyperlipidemia    Hypertension    onset age 48.   OSA on CPAP 07/23/2012   Sleep difficulties       Review of Systems  Constitutional:  Negative for fatigue and unexpected weight change.  Eyes:  Negative for visual disturbance.  Respiratory:  Negative for cough, chest tightness and shortness of breath.   Cardiovascular:  Negative for chest pain, palpitations and leg swelling.  Gastrointestinal:  Negative for abdominal pain and blood in stool.  Neurological:  Negative for dizziness, light-headedness and headaches.     Objective:   Vitals:   11/12/22 0849  BP: 118/78  Pulse: 86  Temp: 97.9 F (36.6 C)  TempSrc: Temporal  SpO2: 97%  Weight: 188 lb 9.6 oz (85.5 kg)  Height:  (1.626 m)  Physical Exam Vitals reviewed.  Constitutional:      Appearance: He is well-developed.  HENT:     Head: Normocephalic and atraumatic.  Neck:     Vascular: No carotid bruit or JVD.  Cardiovascular:     Rate and Rhythm: Normal rate and regular rhythm.     Heart sounds: Normal heart sounds. No murmur heard. Pulmonary:     Effort: Pulmonary effort is normal.     Breath sounds: Normal breath sounds. No rales.  Musculoskeletal:     Right lower leg: No edema.     Left lower leg: No edema.  Skin:    General: Skin is warm and dry.  Neurological:      Mental Status: He is alert and oriented to person, place, and time.  Psychiatric:        Mood and Affect: Mood normal.        Assessment & Plan:  Ricky Diaz is a 47 y.o. male . Essential hypertension Assessment & Plan: With hyperaldosteronism, stable current regimen, continue same.  Check creatinine, labs, 18-month follow-up  Orders: -     Comprehensive metabolic panel -     Labetalol HCl; Take 1 tablet (100 mg total) by mouth 2 (two) times daily.  Dispense: 180 tablet; Refill: 1  Type 2 diabetes mellitus without complication, without long-term current use of insulin Assessment & Plan: Well-controlled with few episodes of hypoglycemia.  Will lower dose of metformin to 500 mg daily for now, check A1c, RTC precautions if hyperglycemia with these changes.  26-month follow-up  Orders: -     Hemoglobin A1c -     Atorvastatin Calcium; TAKE 1 TABLET BY MOUTH DAILY. INITIALLY ONCE PER WEEK AND INCREASE TO DAILY AS TOLERATED.  Dispense: 90 tablet; Refill: 1 -     metFORMIN HCl; Take 1 tablet (500 mg total) by mouth daily.  Dispense: 90 tablet; Refill: 2  LFT elevation Assessment & Plan: Currently on statin, isolated elevated LFT on prior labs, repeat testing.  Asymptomatic.  RTC precautions     Patient Instructions  Try lower dose of metformin at 500 mg once per day.  Watch blood sugars and if those start running higher including any upper 100s or 200s, let me know and we can change dosing.  I will check labs today.  64-month follow-up.  No other med changes for now.  Take care.     Signed,   [name], MD [facility], [facility] 11/12/22 9:32 AM

## 2022-11-12 NOTE — Assessment & Plan Note (Signed)
Well-controlled with few episodes of hypoglycemia.  Will lower dose of metformin to 500 mg daily for now, check A1c, RTC precautions if hyperglycemia with these changes.  7-month follow-up

## 2022-11-12 NOTE — Assessment & Plan Note (Signed)
With hyperaldosteronism, stable current regimen, continue same.  Check creatinine, labs, 83-month follow-up

## 2022-11-12 NOTE — Assessment & Plan Note (Signed)
Currently on statin, isolated elevated LFT on prior labs, repeat testing.  Asymptomatic.  RTC precautions

## 2023-02-11 ENCOUNTER — Ambulatory Visit: Payer: 59 | Admitting: Family Medicine

## 2023-02-11 ENCOUNTER — Encounter: Payer: Self-pay | Admitting: Family Medicine

## 2023-02-11 VITALS — BP 124/70 | HR 78 | Temp 98.0°F | Ht 64.0 in | Wt 187.0 lb

## 2023-02-11 DIAGNOSIS — E785 Hyperlipidemia, unspecified: Secondary | ICD-10-CM

## 2023-02-11 DIAGNOSIS — E1165 Type 2 diabetes mellitus with hyperglycemia: Secondary | ICD-10-CM | POA: Diagnosis not present

## 2023-02-11 DIAGNOSIS — I10 Essential (primary) hypertension: Secondary | ICD-10-CM

## 2023-02-11 DIAGNOSIS — Z1211 Encounter for screening for malignant neoplasm of colon: Secondary | ICD-10-CM

## 2023-02-11 DIAGNOSIS — Z7984 Long term (current) use of oral hypoglycemic drugs: Secondary | ICD-10-CM

## 2023-02-11 LAB — COMPREHENSIVE METABOLIC PANEL
ALT: 39 U/L (ref 0–53)
AST: 18 U/L (ref 0–37)
Albumin: 4.4 g/dL (ref 3.5–5.2)
Alkaline Phosphatase: 62 U/L (ref 39–117)
BUN: 16 mg/dL (ref 6–23)
CO2: 27 mEq/L (ref 19–32)
Calcium: 9.7 mg/dL (ref 8.4–10.5)
Chloride: 103 mEq/L (ref 96–112)
Creatinine, Ser: 1.06 mg/dL (ref 0.40–1.50)
GFR: 82.61 mL/min (ref >60.00)
Glucose, Bld: 172 mg/dL — ABNORMAL HIGH (ref 70–99)
Potassium: 4.1 mEq/L (ref 3.5–5.1)
Sodium: 138 mEq/L (ref 135–145)
Total Bilirubin: 0.4 mg/dL (ref 0.2–1.2)
Total Protein: 7.3 g/dL (ref 6.0–8.3)

## 2023-02-11 LAB — LIPID PANEL
Cholesterol: 150 mg/dL (ref 0–200)
HDL: 46.3 mg/dL (ref >39.00)
LDL Cholesterol: 85 mg/dL (ref 0–99)
NonHDL: 104.03
Total CHOL/HDL Ratio: 3
Triglycerides: 97 mg/dL (ref 0.0–149.0)
VLDL: 19.4 mg/dL (ref 0.0–40.0)

## 2023-02-11 LAB — HEMOGLOBIN A1C: Hgb A1c MFr Bld: 8.3 % — ABNORMAL HIGH (ref 4.6–6.5)

## 2023-02-11 MED ORDER — ATORVASTATIN CALCIUM 10 MG PO TABS
ORAL_TABLET | ORAL | 1 refills | Status: DC
Start: 2023-02-11 — End: 2023-07-11

## 2023-02-11 MED ORDER — LABETALOL HCL 100 MG PO TABS
100.0000 mg | ORAL_TABLET | Freq: Two times a day (BID) | ORAL | 1 refills | Status: DC
Start: 2023-02-11 — End: 2023-08-22

## 2023-02-11 MED ORDER — SPIRONOLACTONE 25 MG PO TABS: 25.0000 mg | ORAL_TABLET | Freq: Every day | ORAL | 3 refills | Status: DC

## 2023-02-11 NOTE — Progress Notes (Signed)
Subjective:  Patient ID: Ricky Diaz, male    DOB: 10-26-1975  Age: 47 y.o. MRN: [id number]  CC:  Chief Complaint  Patient presents with   Medical Management of Chronic Issues    Pt doing well, BG at home has been in range, 105 highest 97, 93, and 88 as the lowest     HPI Ricky Diaz presents for   Diabetes:  With hyperglycemia.  Initially improved A1c from 9.8-7.2 to January, then slight increase back to 7.7 in April.  Had been taking metformin 1000 mg at night with nausea/dizziness with daytime dosing previously.  Was still intermittently dosing metformin at his April visit, only when his blood sugar was elevated, taking approximately 3 to 4 days/week.  We decreased his metformin dose to 500 mg daily with consistent use recommended. He is on statin with Lipitor daily.  Mild elevated LFTs previously. On ACE inhibitor with lisinopril  Home readings fasting: 88-105 Postprandial: unknown - random. Highest 105.  Symptomatic lows: none  Microalbumin: Normal ratio 04/25/2022 Optho, foot exam, pneumovax:  Optho exam last year - advised to schedule appt.  Covid booster: recommended.   Publishing copy, [employer] - staying busy.   No FH  of colon CA, no personal hx of polyps, or bleeding.  Screening options with colonoscopy versus Cologuard discussed. Discussed timing of repeat testing intervals if normal, as well as potential need for diagnostic Colonoscopy if positive Cologuard. Understanding expressed, and chose Colonoscopy.    Lab Results  Component Value Date   HGBA1C 7.7 (H) 11/12/2022   HGBA1C 7.2 (H) 07/24/2022   HGBA1C 9.8 (H) 04/25/2022   Lab Results  Component Value Date   MICROALBUR <0.7 04/25/2022   LDLCALC 64 07/24/2022   CREATININE 1.02 11/12/2022   Hyperlipidemia: Lipitor 10 mg daily, elevated LFTs previously, stable and normal in April.no new med side effects.  Lab Results  Component Value Date   CHOL 135 07/24/2022   HDL 49.00 07/24/2022   LDLCALC  64 07/24/2022   TRIG 114.0 07/24/2022   CHOLHDL 3 07/24/2022   Lab Results  Component Value Date   ALT 38 11/12/2022   AST 22 11/12/2022   ALKPHOS 63 11/12/2022   BILITOT 0.2 11/12/2022   Hypertension: With hyperaldosteronism.  Treated with labetalol, amlodipine, lisinopril, spironolactone. Home readings:  135/69,  no new med side effects.  BP Readings from Last 3 Encounters:  02/11/23 124/70  11/12/22 118/78  07/09/22 124/84   Lab Results  Component Value Date   CREATININE 1.02 11/12/2022       History Patient Active Problem List   Diagnosis Date Noted   Type 2 diabetes mellitus without complication, without long-term current use of insulin (HCC) 11/12/2022   LFT elevation 11/12/2022   Primary hyperaldosteronism (HCC) 06/23/2015   Sleep difficulties    Hypokalemia 06/09/2014   Essential hypertension 08/06/2012   Past Medical History:  Diagnosis Date   Glucose intolerance (impaired glucose tolerance)    Hyperlipidemia    Hypertension    onset age 25.   OSA on CPAP 07/23/2012   Sleep difficulties    Past Surgical History:  Procedure Laterality Date   NO PAST SURGERIES     Allergies  Allergen Reactions   Other    Prior to Admission medications   Medication Sig Start Date End Date Taking? Authorizing Provider  amLODipine (NORVASC) 10 MG tablet Take 1 tablet (10 mg total) by mouth daily. 07/09/22   Shade Flood, MD  atorvastatin (LIPITOR) 10 MG tablet TAKE  1 TABLET BY MOUTH DAILY. INITIALLY ONCE PER WEEK AND INCREASE TO DAILY AS TOLERATED. 11/12/22   Shade Flood, MD  labetalol (NORMODYNE) 100 MG tablet Take 1 tablet (100 mg total) by mouth 2 (two) times daily. 11/12/22   Shade Flood, MD  lisinopril (ZESTRIL) 10 MG tablet Take 1 tablet (10 mg total) by mouth daily. 07/09/22   Shade Flood, MD  metFORMIN (GLUCOPHAGE) 500 MG tablet Take 1 tablet (500 mg total) by mouth daily. 11/12/22   Shade Flood, MD  spironolactone (ALDACTONE) 25 MG  tablet Take 1 tablet (25 mg total) by mouth daily. 04/25/22   Shade Flood, MD   Social History   Socioeconomic History   Marital status: Married    Spouse name: [name]   Number of children: 4   Years of education: 14   Highest education level: Not on file  Occupational History    Employer: [employer]  Tobacco Use   Smoking status: Never   Smokeless tobacco: Never  Vaping Use   Vaping status: Never Used  Substance and Sexual Activity   Alcohol use: Not Currently    Comment: rare   Drug use: No   Sexual activity: Yes    Partners: Female  Other Topics Concern   Not on file  Social History Narrative   Marital status: married x 2007. From [place]; [place] since 2000.      Children: 4 children; no grandchildren.      Lives: with wife ([name]) , children.      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      Tobacco: none      Alcohol: none      Drugs: none      Exercise:  Sporadic.   Patient is right-handed.   Patient has a college education.   Patient no longer drinks caffeine   Social Determinants of Health   Financial Resource Strain: Low Risk  (04/06/2019)   Overall Financial Resource Strain (CARDIA)    Difficulty of Paying Living Expenses: Not hard at all  Food Insecurity: No Food Insecurity (04/06/2019)   Hunger Vital Sign    Worried About Running Out of Food in the Last Year: Never true    Ran Out of Food in the Last Year: Never true  Transportation Needs: No Transportation Needs (04/06/2019)   PRAPARE - Administrator, Civil Service (Medical): No    Lack of Transportation (Non-Medical): No  Physical Activity: Insufficiently Active (04/06/2019)   Exercise Vital Sign    Days of Exercise per Week: 4 days    Minutes of Exercise per Session: 30 min  Stress: No Stress Concern Present (04/06/2019)   [employer] of Occupational Health - Occupational Stress Questionnaire    Feeling of Stress : Only a little  Social  Connections: Unknown (04/06/2019)   Social Connection and Isolation Panel [NHANES]    Frequency of Communication with Friends and Family: Three times a week    Frequency of Social Gatherings with Friends and Family: Twice a week    Attends Religious Services: Patient declined    Database administrator or Organizations: Patient declined    Attends Banker Meetings: Patient declined    Marital Status: Married  Catering manager Violence: Not At Risk (04/06/2019)   Humiliation, Afraid, Rape, and Kick questionnaire    Fear of Current or Ex-Partner: No    Emotionally Abused: No    Physically Abused:  No    Sexually Abused: No    Review of Systems  Constitutional:  Negative for fatigue and unexpected weight change.  Eyes:  Negative for visual disturbance.  Respiratory:  Negative for cough, chest tightness and shortness of breath.   Cardiovascular:  Negative for chest pain, palpitations and leg swelling.  Gastrointestinal:  Negative for abdominal pain and blood in stool.  Neurological:  Negative for dizziness, light-headedness and headaches.     Objective:   Vitals:   02/11/23 0918  BP: 124/70  Pulse: 78  Temp: 98 F (36.7 C)  TempSrc: Temporal  SpO2: 96%  Weight: 187 lb (84.8 kg)  Height: 5\' 4"  (1.626 m)     Physical Exam Vitals reviewed.  Constitutional:      Appearance: He is well-developed.  HENT:     Head: Normocephalic and atraumatic.  Neck:     Vascular: No carotid bruit or JVD.  Cardiovascular:     Rate and Rhythm: Normal rate and regular rhythm.     Heart sounds: Normal heart sounds. No murmur heard. Pulmonary:     Effort: Pulmonary effort is normal.     Breath sounds: Normal breath sounds. No rales.  Abdominal:     General: There is no distension.     Tenderness: There is no abdominal tenderness.  Musculoskeletal:     Right lower leg: No edema.     Left lower leg: No edema.  Skin:    General: Skin is warm and dry.  Neurological:     Mental  Status: He is alert and oriented to person, place, and time.  Psychiatric:        Mood and Affect: Mood normal.        Assessment & Plan:  Ricky Diaz is a 47 y.o. male . Type 2 diabetes mellitus with hyperglycemia, without long-term current use of insulin (HCC) - Plan: Hemoglobin A1c  -  Stable, tolerating current regimen. Medications with refills. Labs pending as above.  Elevated A1c last visit.  Compliant with metformin 500 mg daily dosing.  Low home readings.  Check against A1c and if discrepancy may need new meter or may need to be checking at different times.  19-month follow-up for now.  Screen for colon cancer - Plan: Ambulatory referral to Gastroenterology  Essential hypertension - Plan: labetalol (NORMODYNE) 100 MG tablet, spironolactone (ALDACTONE) 25 MG tablet  -Stable, continue same regimen.  Monitoring labs ordered.  Hyperlipidemia, unspecified hyperlipidemia type - Plan: Comprehensive metabolic panel, Lipid panel, atorvastatin (LIPITOR) 10 MG tablet  -Tolerating statin, continue same dose with labs as above.  Prior elevated LFTs had normalized last visit.  Repeat testing.  Meds ordered this encounter  Medications   atorvastatin (LIPITOR) 10 MG tablet    Sig: TAKE 1 TABLET BY MOUTH DAILY.    Dispense:  90 tablet    Refill:  1   labetalol (NORMODYNE) 100 MG tablet    Sig: Take 1 tablet (100 mg total) by mouth 2 (two) times daily.    Dispense:  180 tablet    Refill:  1   spironolactone (ALDACTONE) 25 MG tablet    Sig: Take 1 tablet (25 mg total) by mouth daily.    Dispense:  90 tablet    Refill:  3   Patient Instructions  Thank you for coming in today.  No medication changes at this time.  If any concerns on labs I will let you know.  31-month follow-up and if still doing well at that time  may be able to space out visit further.  I will refer you to gastroenterology for colon cancer screening.  Please let me know if there are questions and take care.      Signed,   [name], MD [facility], [facility] 02/11/23 9:57 AM

## 2023-02-11 NOTE — Patient Instructions (Signed)
Thank you for coming in today.  No medication changes at this time.  If any concerns on labs I will let you know.  70-month follow-up and if still doing well at that time may be able to space out visit further.  I will refer you to gastroenterology for colon cancer screening.  Please let me know if there are questions and take care.

## 2023-02-20 ENCOUNTER — Telehealth: Payer: Self-pay

## 2023-02-20 NOTE — Telephone Encounter (Signed)
I have informed pt of lab results and he has a 3 month f/u apt scheduled . He states he will increase his Metformin to BID and if he develops any side effects he will call the office .

## 2023-02-20 NOTE — Telephone Encounter (Signed)
  Unfortunately blood sugar control has worsened and A1c needs to be below 7 ideally.  Other than elevated blood sugar the electrolytes, kidney, liver test looked okay.  Cholesterol levels looked okay.   Increase metformin to twice per day and make sure you consistently use that medication.  Recheck levels in 3 months.  Let me know if you have questions.   Dr. Neva Seat

## 2023-03-04 NOTE — Progress Notes (Unsigned)
PATIENT: Ricky Diaz DOB: Mar 19, 1976  REASON FOR VISIT: follow up HISTORY FROM: patient HISTORY OF PRESENT ILLNESS:Dr. Dohmeier  No chief complaint on file.   Today 03/04/23:  Ricky Diaz is a 47 y.o. male with a history of OSA on CPAP. Returns today for follow-up.        04/03/22: Ricky Diaz is a 47 year old male with a history of obstructive sleep apnea on CPAP.  He returns today for follow-up.  His download is below.  He does state that his CPAP machine is staying exceeded motor life.  However his set up date is not clear.  He has not discussed this with his DME company.  He returns today for evaluation.    05/08/21: Ricky Diaz is a 47 year old male with a history of OSA on CPAP. He returns today for follow-up. Reports CPAP is working well. Here for annual DOT compliance.       REVIEW OF SYSTEMS: Out of a complete 14 system review of symptoms, the patient complains only of the following symptoms, and all other reviewed systems are negative.  ESS 1  ALLERGIES: Allergies  Allergen Reactions   Other     HOME MEDICATIONS: Outpatient Medications Prior to Visit  Medication Sig Dispense Refill   amLODipine (NORVASC) 10 MG tablet Take 1 tablet (10 mg total) by mouth daily. 90 tablet 3   atorvastatin (LIPITOR) 10 MG tablet TAKE 1 TABLET BY MOUTH DAILY. 90 tablet 1   labetalol (NORMODYNE) 100 MG tablet Take 1 tablet (100 mg total) by mouth 2 (two) times daily. 180 tablet 1   lisinopril (ZESTRIL) 10 MG tablet Take 1 tablet (10 mg total) by mouth daily. 90 tablet 3   metFORMIN (GLUCOPHAGE) 500 MG tablet Take 1 tablet (500 mg total) by mouth daily. 90 tablet 2   spironolactone (ALDACTONE) 25 MG tablet Take 1 tablet (25 mg total) by mouth daily. 90 tablet 3   No facility-administered medications prior to visit.    PAST MEDICAL HISTORY: Past Medical History:  Diagnosis Date   Glucose intolerance (impaired glucose tolerance)    Hyperlipidemia     Hypertension    onset age 49.   OSA on CPAP 07/23/2012   Sleep difficulties     PAST SURGICAL HISTORY: Past Surgical History:  Procedure Laterality Date   NO PAST SURGERIES      FAMILY HISTORY: Family History  Problem Relation Age of Onset   Hypertension Brother    Sleep apnea Brother    Sleep apnea Brother     SOCIAL HISTORY: Social History   Socioeconomic History   Marital status: Married    Spouse name: Ricky Diaz   Number of children: 4   Years of education: 14   Highest education level: Not on file  Occupational History    Employer: [employer]  Tobacco Use   Smoking status: Never   Smokeless tobacco: Never  Vaping Use   Vaping status: Never Used  Substance and Sexual Activity   Alcohol use: Not Currently    Comment: rare   Drug use: No   Sexual activity: Yes    Partners: Female  Other Topics Concern   Not on file  Social History Narrative   Marital status: married x 2007. From [place]; [place] since 2000.      Children: 4 children; no grandchildren.      Lives: with wife (Ricky Diaz) , children.      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      Tobacco: none      Alcohol: none      Drugs: none      Exercise:  Sporadic.   Patient is right-handed.   Patient has a college education.   Patient no longer drinks caffeine   Social Determinants of Health   Financial Resource Strain: Low Risk  (04/06/2019)   Overall Financial Resource Strain (CARDIA)    Difficulty of Paying Living Expenses: Not hard at all  Food Insecurity: No Food Insecurity (04/06/2019)   Hunger Vital Sign    Worried About Running Out of Food in the Last Year: Never true    Ran Out of Food in the Last Year: Never true  Transportation Needs: No Transportation Needs (04/06/2019)   PRAPARE - Administrator, Civil Service (Medical): No    Lack of Transportation (Non-Medical): No  Physical Activity: Insufficiently Active (04/06/2019)   Exercise Vital Sign     Days of Exercise per Week: 4 days    Minutes of Exercise per Session: 30 min  Stress: No Stress Concern Present (04/06/2019)   [employer] of Occupational Health - Occupational Stress Questionnaire    Feeling of Stress : Only a little  Social Connections: Unknown (04/06/2019)   Social Connection and Isolation Panel [NHANES]    Frequency of Communication with Friends and Family: Three times a week    Frequency of Social Gatherings with Friends and Family: Twice a week    Attends Religious Services: Patient declined    Database administrator or Organizations: Patient declined    Attends Banker Meetings: Patient declined    Marital Status: Married  Catering manager Violence: Not At Risk (04/06/2019)   Humiliation, Afraid, Rape, and Kick questionnaire    Fear of Current or Ex-Partner: No    Emotionally Abused: No    Physically Abused: No    Sexually Abused: No      PHYSICAL EXAM  There were no vitals filed for this visit.  There is no height or weight on file to calculate BMI.  Generalized: Well developed, in no acute distress  Chest: Lungs clear to auscultation bilaterally  Neurological examination  Mentation: Alert oriented to time, place, history taking. Follows all commands speech and language fluent Gait and station: Gait is normal.    DIAGNOSTIC DATA (LABS, IMAGING, TESTING) - I reviewed patient records, labs, notes, testing and imaging myself where available.  Lab Results  Component Value Date   WBC 3.8 (L) 04/25/2021   HGB 13.3 04/25/2021   HCT 39.8 04/25/2021   MCV 91.2 04/25/2021   PLT 195.0 04/25/2021      Component Value Date/Time   NA 138 02/11/2023 1006   NA 139 04/08/2020 0945   K 4.1 02/11/2023 1006   CL 103 02/11/2023 1006   CO2 27 02/11/2023 1006   GLUCOSE 172 (H) 02/11/2023 1006   BUN 16 02/11/2023 1006   BUN 15 04/08/2020 0945   CREATININE 1.06 02/11/2023 1006   CREATININE 1.14 04/02/2016 0958   CALCIUM 9.7 02/11/2023  1006   PROT 7.3 02/11/2023 1006   PROT 7.3 04/08/2020 0945   ALBUMIN 4.4 02/11/2023 1006   ALBUMIN 4.7 04/08/2020 0945   AST 18 02/11/2023 1006   ALT 39 02/11/2023 1006   ALKPHOS 62 02/11/2023 1006   BILITOT 0.4 02/11/2023 1006   BILITOT <0.2 04/08/2020 0945   GFRNONAA 74 04/08/2020 0945   GFRNONAA 79 04/02/2016 0958   GFRAA 86 04/08/2020 0945  GFRAA >89 04/02/2016 0958   Lab Results  Component Value Date   CHOL 150 02/11/2023   HDL 46.30 02/11/2023   LDLCALC 85 02/11/2023   TRIG 97.0 02/11/2023   CHOLHDL 3 02/11/2023   Lab Results  Component Value Date   HGBA1C 8.3 (H) 02/11/2023   Lab Results  Component Value Date   VITAMINB12 1,027 (H) 08/20/2015   Lab Results  Component Value Date   TSH 1.51 04/25/2021      ASSESSMENT AND PLAN 47 y.o. year old male  has a past medical history of Glucose intolerance (impaired glucose tolerance), Hyperlipidemia, Hypertension, OSA on CPAP (07/23/2012), and Sleep difficulties. here with:  OSA on CPAP  - CPAP compliance excellent - Good treatment of AHI  - Encourage patient to use CPAP nightly and > 4 hours each night - Advised we would call  DME company in regards to his machine.  RN called and Patsy Lager stated that his setup date was 2021 and he was not due for new machine. They will reach out to patient regarding message about motor life. - F/U in 1 year or sooner if needed     Butch Penny, MSN, NP-C 03/04/2023, 11:32 AM [facility] [address] [place], [place] [address] [phone]

## 2023-03-05 ENCOUNTER — Encounter: Payer: Self-pay | Admitting: Adult Health

## 2023-03-05 ENCOUNTER — Ambulatory Visit: Payer: 59 | Admitting: Adult Health

## 2023-03-05 VITALS — BP 122/84 | HR 75 | Ht 63.0 in | Wt 186.0 lb

## 2023-03-05 DIAGNOSIS — G4733 Obstructive sleep apnea (adult) (pediatric): Secondary | ICD-10-CM

## 2023-03-12 ENCOUNTER — Telehealth: Payer: Self-pay | Admitting: *Deleted

## 2023-03-12 NOTE — Telephone Encounter (Signed)
Spoke with pt. He wants to continue using [facility]. He will watch for a call from [employer].  I encouraged him to either call us or [facility] if he has not heard from them within 1 week.  He verbalized understanding and appreciation for the call.  Order sent to [facility].

## 2023-03-12 NOTE — Telephone Encounter (Signed)
-----   Message from Butch Penny sent at 03/05/2023 12:18 PM EDT ----- Please resend DME order for new machine

## 2023-03-28 NOTE — Telephone Encounter (Signed)
Order confirmed by [facility] today.

## 2023-05-20 ENCOUNTER — Encounter: Payer: Self-pay | Admitting: Family Medicine

## 2023-05-20 ENCOUNTER — Ambulatory Visit: Payer: 59 | Admitting: Family Medicine

## 2023-05-20 VITALS — BP 128/74 | HR 86 | Temp 98.3°F | Ht 63.0 in | Wt 188.2 lb

## 2023-05-20 DIAGNOSIS — Z23 Encounter for immunization: Secondary | ICD-10-CM

## 2023-05-20 DIAGNOSIS — Z7984 Long term (current) use of oral hypoglycemic drugs: Secondary | ICD-10-CM | POA: Diagnosis not present

## 2023-05-20 DIAGNOSIS — E1165 Type 2 diabetes mellitus with hyperglycemia: Secondary | ICD-10-CM | POA: Diagnosis not present

## 2023-05-20 LAB — COMPREHENSIVE METABOLIC PANEL
ALT: 34 U/L (ref 0–53)
AST: 19 U/L (ref 0–37)
Albumin: 4.5 g/dL (ref 3.5–5.2)
Alkaline Phosphatase: 62 U/L (ref 39–117)
BUN: 14 mg/dL (ref 6–23)
CO2: 28 meq/L (ref 19–32)
Calcium: 10.1 mg/dL (ref 8.4–10.5)
Chloride: 102 meq/L (ref 96–112)
Creatinine, Ser: 1.17 mg/dL (ref 0.40–1.50)
GFR: 73.24 mL/min (ref >60.00)
Glucose, Bld: 167 mg/dL — ABNORMAL HIGH (ref 70–99)
Potassium: 4.4 meq/L (ref 3.5–5.1)
Sodium: 137 meq/L (ref 135–145)
Total Bilirubin: 0.3 mg/dL (ref 0.2–1.2)
Total Protein: 7.5 g/dL (ref 6.0–8.3)

## 2023-05-20 LAB — HEMOGLOBIN A1C: Hgb A1c MFr Bld: 7.5 % — ABNORMAL HIGH (ref 4.6–6.5)

## 2023-05-20 NOTE — Patient Instructions (Signed)
Thanks for coming in today.  Depending on your labs we can discuss if change in metformin dose is needed, but okay to stay at once per day dosing for now based on your home readings.  Recheck in 3 months but let me know if there are any questions in the meantime.  You can have the COVID-19 vaccine booster at your local pharmacy.  Have a safe trip!

## 2023-05-20 NOTE — Progress Notes (Signed)
Subjective:  Patient ID: Ricky Diaz, male    DOB: [birth year]  Age: 47 y.o. MRN: [id number]  CC:  Chief Complaint  Patient presents with   Medical Management of Chronic Issues    Pt is doing well, no concerns from pt at this time     HPI [name] presents for   Diabetes: With hyperglycemia.  Improving A1c earlier this year, and has crept up since that time.  Has had to take metformin at night due to nausea with daytime dosing previously.  Intermittent dosing of metformin previously, discussed continued daily dosing need, and 500 mg daily with consistent dosing discussed at his April visit. He is on statin with Lipitor daily.  ACE inhibitor with lisinopril.  Hyperaldosteronism, on labetalol, amlodipine, spironolactone addition to the lisinopril.  Discussed at his July visit.  OSA on CPAP, met with sleep specialist in August. Elevated A1c in July, recommended increasing metformin to 2/day. Still taking one per day. Unaware of need for change to BID.  Home readings fasting: 95-98 postprandial:110-115.  Symptomatic lows:none  Microalbumin: Normal ratio 04/25/2022. Optho, foot exam, pneumovax:  Optho: will be [place], plans optho appt once he returns. Advised to schedule today.  Flu vaccine - today.  Covid vaccine booster- recommended at pharmacy.   Lab Results  Component Value Date   HGBA1C 8.3 (H) 02/11/2023   HGBA1C 7.7 (H) 11/12/2022   HGBA1C 7.2 (H) 07/24/2022   Lab Results  Component Value Date   MICROALBUR <0.7 04/25/2022   LDLCALC 85 02/11/2023   CREATININE 1.06 02/11/2023     History Patient Active Problem List   Diagnosis Date Noted   Type 2 diabetes mellitus without complication, without long-term current use of insulin (HCC) 11/12/2022   LFT elevation 11/12/2022   Primary hyperaldosteronism (HCC) 06/23/2015   Sleep difficulties    Hypokalemia 06/09/2014   Essential hypertension 08/06/2012   Past Medical History:  Diagnosis  Date   Glucose intolerance (impaired glucose tolerance)    Hyperlipidemia    Hypertension    onset age 45.   OSA on CPAP 07/23/2012   Sleep difficulties    Past Surgical History:  Procedure Laterality Date   NO PAST SURGERIES     Allergies  Allergen Reactions   Other    Prior to Admission medications   Medication Sig Start Date End Date Taking? Authorizing Provider  amLODipine (NORVASC) 10 MG tablet Take 1 tablet (10 mg total) by mouth daily. 07/09/22  Yes Shade Flood, MD  atorvastatin (LIPITOR) 10 MG tablet TAKE 1 TABLET BY MOUTH DAILY. 02/11/23  Yes Shade Flood, MD  labetalol (NORMODYNE) 100 MG tablet Take 1 tablet (100 mg total) by mouth 2 (two) times daily. 02/11/23  Yes Shade Flood, MD  lisinopril (ZESTRIL) 10 MG tablet Take 1 tablet (10 mg total) by mouth daily. 07/09/22  Yes Shade Flood, MD  metFORMIN (GLUCOPHAGE) 500 MG tablet Take 1 tablet (500 mg total) by mouth daily. Patient taking differently: Take 1,000 mg by mouth daily. 11/12/22  Yes Shade Flood, MD  spironolactone (ALDACTONE) 25 MG tablet Take 1 tablet (25 mg total) by mouth daily. 02/11/23  Yes Shade Flood, MD   Social History   Socioeconomic History   Marital status: Married    Spouse name: Ricky Diaz   Number of children: 4   Years of education: 14   Highest education level: Not on file  Occupational History    Employer: [employer]  Tobacco Use   Smoking status: Never   Smokeless tobacco: Never  Vaping Use   Vaping status: Never Used  Substance and Sexual Activity   Alcohol use: Not Currently    Comment: rare   Drug use: No   Sexual activity: Yes    Partners: Female  Other Topics Concern   Not on file  Social History Narrative   Marital status: married x 2007. From [place]; [place] since 2000.      Children: 4 children; no grandchildren.      Lives: with wife (Ricky Diaz) , children.      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       Tobacco: none      Alcohol: none      Drugs: none      Exercise:  Sporadic.   Patient is right-handed.   Patient has a college education.   Patient no longer drinks caffeine   Social Determinants of Health   Financial Resource Strain: Low Risk  (04/06/2019)   Overall Financial Resource Strain (CARDIA)    Difficulty of Paying Living Expenses: Not hard at all  Food Insecurity: No Food Insecurity (04/06/2019)   Hunger Vital Sign    Worried About Running Out of Food in the Last Year: Never true    Ran Out of Food in the Last Year: Never true  Transportation Needs: No Transportation Needs (04/06/2019)   PRAPARE - Administrator, Civil Service (Medical): No    Lack of Transportation (Non-Medical): No  Physical Activity: Insufficiently Active (04/06/2019)   Exercise Vital Sign    Days of Exercise per Week: 4 days    Minutes of Exercise per Session: 30 min  Stress: No Stress Concern Present (04/06/2019)   [employer] of Occupational Health - Occupational Stress Questionnaire    Feeling of Stress : Only a little  Social Connections: Unknown (04/06/2019)   Social Connection and Isolation Panel [NHANES]    Frequency of Communication with Friends and Family: Three times a week    Frequency of Social Gatherings with Friends and Family: Twice a week    Attends Religious Services: Patient declined    Database administrator or Organizations: Patient declined    Attends Banker Meetings: Patient declined    Marital Status: Married  Catering manager Violence: Not At Risk (04/06/2019)   Humiliation, Afraid, Rape, and Kick questionnaire    Fear of Current or Ex-Partner: No    Emotionally Abused: No    Physically Abused: No    Sexually Abused: No    Review of Systems  Constitutional:  Negative for fatigue and unexpected weight change.  Eyes:  Negative for visual disturbance.  Respiratory:  Negative for cough, chest tightness and shortness of breath.   Cardiovascular:   Negative for chest pain, palpitations and leg swelling.  Gastrointestinal:  Negative for abdominal pain and blood in stool.  Neurological:  Negative for dizziness, light-headedness and headaches.     Objective:   Vitals:   05/20/23 0840  BP: 128/74  Pulse: 86  Temp: 98.3 F (36.8 C)  TempSrc: Temporal  SpO2: 97%  Weight: 188 lb 3.2 oz (85.4 kg)  Height: 5\' 3"  (1.6 m)     Physical Exam Vitals reviewed.  Constitutional:      Appearance: He is well-developed.  HENT:     Head: Normocephalic and atraumatic.  Neck:     Vascular: No carotid bruit or JVD.  Cardiovascular:  Rate and Rhythm: Normal rate and regular rhythm.     Heart sounds: Normal heart sounds. No murmur heard. Pulmonary:     Effort: Pulmonary effort is normal.     Breath sounds: Normal breath sounds. No rales.  Musculoskeletal:     Right lower leg: No edema.     Left lower leg: No edema.  Skin:    General: Skin is warm and dry.  Neurological:     Mental Status: He is alert and oriented to person, place, and time.  Psychiatric:        Mood and Affect: Mood normal.     Assessment & Plan:  Bronislaw Mesic is a 47 y.o. male . Type 2 diabetes mellitus with hyperglycemia, without long-term current use of insulin (HCC) - Plan: Hemoglobin A1c, Comprehensive metabolic panel  -Increase in A1c last visit but home readings are reassuring recently.  Still on metformin once per day.  Check electrolytes, A1c and adjust regimen accordingly.  He will be out of country for 1 month.  Plans on scheduling ophthalmology exam upon his return.  COVID booster recommended.  No other med changes at this time, 46-month follow-up for physical.  Flu vaccine need - Plan: Flu vaccine trivalent PF, 6mos and older(Flulaval,Afluria,Fluarix,Fluzone)   No orders of the defined types were placed in this encounter.  Patient Instructions  Thanks for coming in today.  Depending on your labs we can discuss if change in metformin dose is  needed, but okay to stay at once per day dosing for now based on your home readings.  Recheck in 3 months but let me know if there are any questions in the meantime.  You can have the COVID-19 vaccine booster at your local pharmacy.  Have a safe trip!      Signed,   [name], MD [facility], [facility] 05/20/23 9:13 AM

## 2023-05-23 ENCOUNTER — Telehealth: Payer: Self-pay

## 2023-05-23 NOTE — Telephone Encounter (Signed)
-----   Message from Shade Flood sent at 05/22/2023  9:27 PM EDT ----- Call patient.  Blood sugar control better than in July but still elevated A1c is 7.5 with goal of less than 7.  Blood sugar 167 in the office but other electrolytes looked okay.  He should be taking 2 metformin per day, discussed at last visit, check A1c in 3 months, let me know if there are questions.

## 2023-05-24 NOTE — Telephone Encounter (Signed)
Unable to leave vm (full)  Letter sent

## 2023-05-24 NOTE — Telephone Encounter (Signed)
 Called patient to discuss lab work, no answer, left a message for the patient to call back to discuss

## 2023-07-11 ENCOUNTER — Telehealth: Payer: Self-pay

## 2023-07-11 ENCOUNTER — Other Ambulatory Visit: Payer: Self-pay | Admitting: Family Medicine

## 2023-07-11 DIAGNOSIS — I10 Essential (primary) hypertension: Secondary | ICD-10-CM

## 2023-07-11 DIAGNOSIS — E785 Hyperlipidemia, unspecified: Secondary | ICD-10-CM

## 2023-07-11 MED ORDER — ATORVASTATIN CALCIUM 10 MG PO TABS: ORAL_TABLET | ORAL | 1 refills | Status: DC

## 2023-07-11 MED ORDER — LISINOPRIL 10 MG PO TABS: 10.0000 mg | ORAL_TABLET | Freq: Every day | ORAL | 3 refills | Status: DC

## 2023-07-11 NOTE — Telephone Encounter (Signed)
Copied from CRM [id number]. Topic: Clinical - Medication Refill >> Jul 11, 2023  3:00 PM Sim Boast F wrote: Most Recent Primary Care Visit:  Provider: [name] R  Department: [facility]  Visit Type: OFFICE VISIT  Date: 05/20/2023  Medication: lisinopril   Has the patient contacted their pharmacy?  (Agent: If no, request that the patient contact the pharmacy for the refill. If patient does not wish to contact the pharmacy document the reason why and proceed with request.) (Agent: If yes, when and what did the pharmacy advise?)  Is this the correct pharmacy for this prescription?  If no, delete pharmacy and type the correct one.  This is the patient's preferred pharmacy:  [facility] [address] [place] - [address] [place] [address] Phone: [phone] Fax: [phone]   Has the prescription been filled recently?   Is the patient out of the medication?   Has the patient been seen for an appointment in the last year OR does the patient have an upcoming appointment?   Can we respond through MyChart?   Agent: Please be advised that Rx refills may take up to 3 business days. We ask that you follow-up with your pharmacy.

## 2023-07-11 NOTE — Telephone Encounter (Signed)
Copied from CRM [id number]. Topic: Clinical - Lab/Test Results >> Jul 11, 2023  2:59 PM Sim Boast F wrote: Reason for CRM: [place] and would like a call regarding his lab results.

## 2023-07-11 NOTE — Telephone Encounter (Signed)
Copied from CRM [id number]. Topic: Clinical - Medication Refill >> Jul 11, 2023  3:00 PM Sim Boast F wrote: Most Recent Primary Care Visit:  Provider: [name] R  Department: [facility]  Visit Type: OFFICE VISIT  Date: 05/20/2023  Medication: Lisinopril and Atorvastatin   Has the patient contacted their pharmacy? Yes (Agent: If no, request that the patient contact the pharmacy for the refill. If patient does not wish to contact the pharmacy document the reason why and proceed with request.) (Agent: If yes, when and what did the pharmacy advise?)  Is this the correct pharmacy for this prescription? Yes If no, delete pharmacy and type the correct one.  This is the patient's preferred pharmacy:   [facility] [address] [place] - [address] [place] [address] Phone: [phone] Fax: [phone]   Has the prescription been filled recently? Yes  Is the patient out of the medication? Yes  Has the patient been seen for an appointment in the last year OR does the patient have an upcoming appointment? Yes  Can we respond through MyChart? No  Agent: Please be advised that Rx refills may take up to 3 business days. We ask that you follow-up with your pharmacy.

## 2023-07-11 NOTE — Telephone Encounter (Signed)
Spoke with pt about lab results Pt has no concerns or questions

## 2023-08-06 ENCOUNTER — Other Ambulatory Visit: Payer: Self-pay | Admitting: Family Medicine

## 2023-08-06 DIAGNOSIS — I10 Essential (primary) hypertension: Secondary | ICD-10-CM

## 2023-08-11 ENCOUNTER — Other Ambulatory Visit: Payer: Self-pay | Admitting: Family Medicine

## 2023-08-11 DIAGNOSIS — E119 Type 2 diabetes mellitus without complications: Secondary | ICD-10-CM

## 2023-08-12 ENCOUNTER — Other Ambulatory Visit: Payer: Self-pay

## 2023-08-12 DIAGNOSIS — E119 Type 2 diabetes mellitus without complications: Secondary | ICD-10-CM

## 2023-08-12 MED ORDER — METFORMIN HCL 1000 MG PO TABS: 1000.0000 mg | ORAL_TABLET | Freq: Every day | ORAL | 1 refills | Status: DC

## 2023-08-22 ENCOUNTER — Ambulatory Visit: Payer: 59 | Admitting: Family Medicine

## 2023-08-22 ENCOUNTER — Encounter: Payer: Self-pay | Admitting: Family Medicine

## 2023-08-22 VITALS — BP 128/84 | HR 82 | Temp 98.1°F | Ht 63.25 in | Wt 178.0 lb

## 2023-08-22 DIAGNOSIS — Z0000 Encounter for general adult medical examination without abnormal findings: Secondary | ICD-10-CM | POA: Diagnosis not present

## 2023-08-22 DIAGNOSIS — Z7984 Long term (current) use of oral hypoglycemic drugs: Secondary | ICD-10-CM

## 2023-08-22 DIAGNOSIS — I10 Essential (primary) hypertension: Secondary | ICD-10-CM

## 2023-08-22 DIAGNOSIS — E119 Type 2 diabetes mellitus without complications: Secondary | ICD-10-CM | POA: Diagnosis not present

## 2023-08-22 DIAGNOSIS — Z125 Encounter for screening for malignant neoplasm of prostate: Secondary | ICD-10-CM | POA: Diagnosis not present

## 2023-08-22 DIAGNOSIS — Z23 Encounter for immunization: Secondary | ICD-10-CM

## 2023-08-22 DIAGNOSIS — Z1329 Encounter for screening for other suspected endocrine disorder: Secondary | ICD-10-CM

## 2023-08-22 DIAGNOSIS — E785 Hyperlipidemia, unspecified: Secondary | ICD-10-CM | POA: Diagnosis not present

## 2023-08-22 LAB — CBC WITH DIFFERENTIAL/PLATELET
Basophils Absolute: 0 10*3/uL (ref 0.0–0.1)
Basophils Relative: 0.7 % (ref 0.0–3.0)
Eosinophils Absolute: 0 10*3/uL (ref 0.0–0.7)
Eosinophils Relative: 0.8 % (ref 0.0–5.0)
HCT: 40.6 % (ref 39.0–52.0)
Hemoglobin: 13.3 g/dL (ref 13.0–17.0)
Lymphocytes Relative: 33.5 % (ref 12.0–46.0)
Lymphs Abs: 1.3 10*3/uL (ref 0.7–4.0)
MCHC: 32.7 g/dL (ref 30.0–36.0)
MCV: 92.4 fL (ref 78.0–100.0)
Monocytes Absolute: 0.4 10*3/uL (ref 0.1–1.0)
Monocytes Relative: 11.4 % (ref 3.0–12.0)
Neutro Abs: 2 10*3/uL (ref 1.4–7.7)
Neutrophils Relative %: 53.6 % (ref 43.0–77.0)
Platelets: 210 10*3/uL (ref 150.0–400.0)
RBC: 4.39 Mil/uL (ref 4.22–5.81)
RDW: 12.9 % (ref 11.5–15.5)
WBC: 3.8 10*3/uL — ABNORMAL LOW (ref 4.0–10.5)

## 2023-08-22 LAB — TSH: TSH: 1.08 u[IU]/mL (ref 0.35–5.50)

## 2023-08-22 LAB — LIPID PANEL
Cholesterol: 117 mg/dL (ref 0–200)
HDL: 50.5 mg/dL (ref >39.00)
LDL Cholesterol: 56 mg/dL (ref 0–99)
NonHDL: 66.61
Total CHOL/HDL Ratio: 2
Triglycerides: 51 mg/dL (ref 0.0–149.0)
VLDL: 10.2 mg/dL (ref 0.0–40.0)

## 2023-08-22 LAB — COMPREHENSIVE METABOLIC PANEL
ALT: 20 U/L (ref 0–53)
AST: 17 U/L (ref 0–37)
Albumin: 4.5 g/dL (ref 3.5–5.2)
Alkaline Phosphatase: 61 U/L (ref 39–117)
BUN: 12 mg/dL (ref 6–23)
CO2: 28 meq/L (ref 19–32)
Calcium: 10 mg/dL (ref 8.4–10.5)
Chloride: 102 meq/L (ref 96–112)
Creatinine, Ser: 0.97 mg/dL (ref 0.40–1.50)
GFR: 91.55 mL/min (ref >60.00)
Glucose, Bld: 181 mg/dL — ABNORMAL HIGH (ref 70–99)
Potassium: 4.3 meq/L (ref 3.5–5.1)
Sodium: 138 meq/L (ref 135–145)
Total Bilirubin: 0.5 mg/dL (ref 0.2–1.2)
Total Protein: 7 g/dL (ref 6.0–8.3)

## 2023-08-22 LAB — PSA: PSA: 0.37 ng/mL (ref 0.10–4.00)

## 2023-08-22 LAB — HEMOGLOBIN A1C: Hgb A1c MFr Bld: 12.3 % — ABNORMAL HIGH (ref 4.6–6.5)

## 2023-08-22 MED ORDER — LABETALOL HCL 100 MG PO TABS: 100.0000 mg | ORAL_TABLET | Freq: Two times a day (BID) | ORAL | 1 refills | Status: DC

## 2023-08-22 NOTE — Patient Instructions (Addendum)
If blood sugar still elevated, we could increase the dose of metformin, but use the extended release dose - once per day. I will let you know once I see labs.  I will order the Cologuard colon cancer screening test.  Tetanus and pneumonia vaccines were updated today.  Please call and schedule eye doctor appointment.  Try gentle range of motion of neck and stretches of upper back after work.  Warm towel or hot shower can sometimes be helpful to if there is some muscle spasm.  Follow-up if that pain does not improve or any worsening.  No medication changes at this time.  Take care!  Preventive Care 94-17 Years Old, Male Preventive care refers to lifestyle choices and visits with your health care provider that can promote health and wellness. Preventive care visits are also called wellness exams. What can I expect for my preventive care visit? Counseling During your preventive care visit, your health care provider may ask about your: Medical history, including: Past medical problems. Family medical history. Current health, including: Emotional well-being. Home life and relationship well-being. Sexual activity. Lifestyle, including: Alcohol, nicotine or tobacco, and drug use. Access to firearms. Diet, exercise, and sleep habits. Safety issues such as seatbelt and bike helmet use. Sunscreen use. Work and work Astronomer. Physical exam Your health care provider will check your: Height and weight. These may be used to calculate your BMI (body mass index). BMI is a measurement that tells if you are at a healthy weight. Waist circumference. This measures the distance around your waistline. This measurement also tells if you are at a healthy weight and may help predict your risk of certain diseases, such as type 2 diabetes and high blood pressure. Heart rate and blood pressure. Body temperature. Skin for abnormal spots. What immunizations do I need?  Vaccines are usually given at various  ages, according to a schedule. Your health care provider will recommend vaccines for you based on your age, medical history, and lifestyle or other factors, such as travel or where you work. What tests do I need? Screening Your health care provider may recommend screening tests for certain conditions. This may include: Lipid and cholesterol levels. Diabetes screening. This is done by checking your blood sugar (glucose) after you have not eaten for a while (fasting). Hepatitis B test. Hepatitis C test. HIV (human immunodeficiency virus) test. STI (sexually transmitted infection) testing, if you are at risk. Lung cancer screening. Prostate cancer screening. Colorectal cancer screening. Talk with your health care provider about your test results, treatment options, and if necessary, the need for more tests. Follow these instructions at home: Eating and drinking  Eat a diet that includes fresh fruits and vegetables, whole grains, lean protein, and low-fat dairy products. Take vitamin and mineral supplements as recommended by your health care provider. Do not drink alcohol if your health care provider tells you not to drink. If you drink alcohol: Limit how much you have to 0-2 drinks a day. Know how much alcohol is in your drink. In the [place] one drink equals one 12 oz bottle of beer (355 mL), one 5 oz glass of wine (148 mL), or one 1 oz glass of hard liquor (44 mL). Lifestyle Brush your teeth every morning and night with fluoride toothpaste. Floss one time each day. Exercise for at least 30 minutes 5 or more days each week. Do not use any products that contain nicotine or tobacco. These products include cigarettes, chewing tobacco, and vaping devices, such as e-cigarettes.  If you need help quitting, ask your health care provider. Do not use drugs. If you are sexually active, practice safe sex. Use a condom or other form of protection to prevent STIs. Take aspirin only as told by your health  care provider. Make sure that you understand how much to take and what form to take. Work with your health care provider to find out whether it is safe and beneficial for you to take aspirin daily. Find healthy ways to manage stress, such as: Meditation, yoga, or listening to music. Journaling. Talking to a trusted person. Spending time with friends and family. Minimize exposure to UV radiation to reduce your risk of skin cancer. Safety Always wear your seat belt while driving or riding in a vehicle. Do not drive: If you have been drinking alcohol. Do not ride with someone who has been drinking. When you are tired or distracted. While texting. If you have been using any mind-altering substances or drugs. Wear a helmet and other protective equipment during sports activities. If you have firearms in your house, make sure you follow all gun safety procedures. What's next? Go to your health care provider once a year for an annual wellness visit. Ask your health care provider how often you should have your eyes and teeth checked. Stay up to date on all vaccines. This information is not intended to replace advice given to you by your health care provider. Make sure you discuss any questions you have with your health care provider. Document Revised: 01/04/2021 Document Reviewed: 01/04/2021 Elsevier Patient Education  2024 [employer].

## 2023-08-22 NOTE — Progress Notes (Signed)
Subjective:  Patient ID: Ricky Diaz, male    DOB: 1975/10/15  Age: 48 y.o. MRN: [id number]  CC:  Chief Complaint  Patient presents with   Annual Exam    Pt is doing well, no concerns, fasting     HPI Ricky Diaz presents for Annual Exam PCP, me Neuro, Butch Penny, NP, OSA on CPAP. Using consistently. Feels rested. Working with sleep specialist regarding new machine.   Diabetes: With hyperglycemia.  Previous nausea with daytime dosing of metformin, 500 mg daily.  Twice daily dosing trial discussed for improved control. Still only taking once per day. In the afternoon. No side effects. If taking in am - felt weak. Not feeling with afternoon dose.  On statin with Lipitor, ACE inhibitor with lisinopril. Home readings: Fasting - 117 Postprandial - 120-130 No symptomatic lows.   Microalbumin: nl ratio in 2023.  Optho, foot exam, pneumovax:  Optho, he had planned on visit once [place] from [place] when last discussed in October. - has not yet scheduled - plans to schedule today.  Pneumococcal vaccine: today.     Lab Results  Component Value Date   HGBA1C 7.5 (H) 05/20/2023   HGBA1C 8.3 (H) 02/11/2023   HGBA1C 7.7 (H) 11/12/2022   Lab Results  Component Value Date   MICROALBUR <0.7 04/25/2022   LDLCALC 85 02/11/2023   CREATININE 1.17 05/20/2023    Hypertension: With OSA on CPAP, and primary hyperaldosteronism. Treated with labetalol, amlodipine, spironolactone, lisinopril. No new side effects.  Home readings:123/82-85 BP Readings from Last 3 Encounters:  08/22/23 128/84  05/20/23 128/74  03/05/23 122/84   Lab Results  Component Value Date   CREATININE 1.17 05/20/2023   Hyperlipidemia: Lipitor 10mg  every day. No new myaligias. Occasional rare soreness mid back after work.  Lab Results  Component Value Date   CHOL 150 02/11/2023   HDL 46.30 02/11/2023   LDLCALC 85 02/11/2023   TRIG 97.0 02/11/2023   CHOLHDL 3 02/11/2023   Lab Results   Component Value Date   ALT 34 05/20/2023   AST 19 05/20/2023   ALKPHOS 62 05/20/2023   BILITOT 0.3 05/20/2023      08/22/2023    8:53 AM 05/20/2023    8:40 AM 02/11/2023    9:11 AM 11/12/2022    8:50 AM 07/09/2022    9:51 AM  Depression screen PHQ 2/9  Decreased Interest 0 0 0 0 0  Down, Depressed, Hopeless 0 0 0 0 0  PHQ - 2 Score 0 0 0 0 0  Altered sleeping 0 0 0 1 0  Tired, decreased energy 0 0 0 0 1  Change in appetite 0 0 0 0 0  Feeling bad or failure about yourself  0 0 0 0 0  Trouble concentrating 0 0 0 0 0  Moving slowly or fidgety/restless 0 0 0 0 0  Suicidal thoughts 0 0 0 0 0  PHQ-9 Score 0 0 0 1 1  Difficult doing work/chores    Not difficult at all     Health Maintenance  Topic Date Due   Pneumococcal Vaccine 73-75 Years old (1 of 2 - PCV) Never done   OPHTHALMOLOGY EXAM  Never done   Colonoscopy  Never done   COVID-19 Vaccine (2 - 2024-25 season) 03/24/2023   Diabetic kidney evaluation - Urine ACR  04/26/2027 (Originally 04/26/2023)   DTaP/Tdap/Td (2 - Td or Tdap) 09/02/2023   HEMOGLOBIN A1C  11/18/2023   FOOT EXAM  02/11/2024   Diabetic  kidney evaluation - eGFR measurement  05/19/2024   INFLUENZA VACCINE  Completed   Hepatitis C Screening  Completed   HIV Screening  Completed   HPV VACCINES  Aged Out  Colon cancer screening, denies family history of colon cancer or personal history of bleeding or polyps.  Screening options with colonoscopy versus Cologuard discussed. Discussed timing of repeat testing intervals if normal, as well as potential need for diagnostic Colonoscopy if positive Cologuard. Understanding expressed, and chose Cologuard.  Prostate: does not have family history of prostate cancer The natural history of prostate cancer and ongoing controversy regarding screening and potential treatment outcomes of prostate cancer has been discussed with the patient. The meaning of a false positive PSA and a false negative PSA has been discussed. He  indicates understanding of the limitations of this screening test and wishes to proceed with screening PSA testing. No results found for: "PSA1", "PSA"    Immunization History  Administered Date(s) Administered   Influenza, Seasonal, Injecte, Preservative Fre 08/06/2012, 05/20/2023   Influenza,inj,Quad PF,6+ Mos 07/24/2013, 04/02/2016, 03/25/2018, 04/06/2019, 04/08/2020, 04/25/2021, 04/25/2022   Influenza,inj,quad, With Preservative 04/22/2018   Janssen (J&J) SARS-COV-2 Vaccination 12/07/2019   PPD Test 04/12/2013   Tdap 09/01/2013  Prevnar today.  Tdap today. Due soon.   No results found. Plans for optho appt soon.   Dental: saw dentist in [place] recently.   Alcohol: rare - holidays.   Tobacco: none.   Exercise: walking, treadmill. 3 times per week. 45 min to 1 hour. Physical job   History Patient Active Problem List   Diagnosis Date Noted   Type 2 diabetes mellitus without complication, without long-term current use of insulin (HCC) 11/12/2022   LFT elevation 11/12/2022   Primary hyperaldosteronism (HCC) 06/23/2015   Sleep difficulties    Hypokalemia 06/09/2014   Essential hypertension 08/06/2012   Past Medical History:  Diagnosis Date   Glucose intolerance (impaired glucose tolerance)    Hyperlipidemia    Hypertension    onset age 89.   OSA on CPAP 07/23/2012   Sleep difficulties    Past Surgical History:  Procedure Laterality Date   NO PAST SURGERIES     Allergies  Allergen Reactions   Other    Prior to Admission medications   Medication Sig Start Date End Date Taking? Authorizing Provider  amLODipine (NORVASC) 10 MG tablet TAKE 1 TABLET BY MOUTH EVERY DAY 08/06/23   Shade Flood, MD  atorvastatin (LIPITOR) 10 MG tablet TAKE 1 TABLET BY MOUTH DAILY. 07/11/23   Shade Flood, MD  labetalol (NORMODYNE) 100 MG tablet Take 1 tablet (100 mg total) by mouth 2 (two) times daily. 02/11/23   Shade Flood, MD  lisinopril (ZESTRIL) 10 MG tablet Take 1  tablet (10 mg total) by mouth daily. 07/11/23   Shade Flood, MD  metFORMIN (GLUCOPHAGE) 1000 MG tablet Take 1 tablet (1,000 mg total) by mouth daily. 08/12/23   Shade Flood, MD  spironolactone (ALDACTONE) 25 MG tablet Take 1 tablet (25 mg total) by mouth daily. 02/11/23   Shade Flood, MD   Social History   Socioeconomic History   Marital status: Married    Spouse name: [name]   Number of children: 4   Years of education: 14   Highest education level: Not on file  Occupational History    Employer: [employer]  Tobacco Use   Smoking status: Never   Smokeless tobacco: Never  Vaping Use   Vaping status: Never Used  Substance and Sexual Activity   Alcohol use: Yes    Comment: rare/social   Drug use: No   Sexual activity: Yes    Partners: Female  Other Topics Concern   Not on file  Social History Narrative   Marital status: married x 2007. From [place]; [place] since 2000.      Children: 4 children; no grandchildren.      Lives: with wife ([name]) , children.      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      Tobacco: none      Alcohol: none      Drugs: none      Exercise:  Sporadic.   Patient is right-handed.   Patient has a college education.   Patient no longer drinks caffeine   Social Drivers of Corporate investment banker Strain: Low Risk  (04/06/2019)   Overall Financial Resource Strain (CARDIA)    Difficulty of Paying Living Expenses: Not hard at all  Food Insecurity: No Food Insecurity (04/06/2019)   Hunger Vital Sign    Worried About Running Out of Food in the Last Year: Never true    Ran Out of Food in the Last Year: Never true  Transportation Needs: No Transportation Needs (04/06/2019)   PRAPARE - Administrator, Civil Service (Medical): No    Lack of Transportation (Non-Medical): No  Physical Activity: Insufficiently Active (04/06/2019)   Exercise Vital Sign    Days of Exercise per Week: 4 days     Minutes of Exercise per Session: 30 min  Stress: No Stress Concern Present (04/06/2019)   [employer] of Occupational Health - Occupational Stress Questionnaire    Feeling of Stress : Only a little  Social Connections: Unknown (04/06/2019)   Social Connection and Isolation Panel [NHANES]    Frequency of Communication with Friends and Family: Three times a week    Frequency of Social Gatherings with Friends and Family: Twice a week    Attends Religious Services: Patient declined    Database administrator or Organizations: Patient declined    Attends Banker Meetings: Patient declined    Marital Status: Married  Catering manager Violence: Not At Risk (04/06/2019)   Humiliation, Afraid, Rape, and Kick questionnaire    Fear of Current or Ex-Partner: No    Emotionally Abused: No    Physically Abused: No    Sexually Abused: No    Review of Systems  Constitutional:  Negative for fatigue and unexpected weight change.  Eyes:  Negative for visual disturbance.  Respiratory:  Negative for cough, chest tightness and shortness of breath.   Cardiovascular:  Negative for chest pain, palpitations and leg swelling.  Gastrointestinal:  Negative for abdominal pain and blood in stool.  Neurological:  Negative for dizziness, light-headedness and headaches.   13 point review of systems per patient health survey noted.  Negative other than as indicated above or in HPI.    Objective:   Vitals:   08/22/23 0851  BP: 128/84  Pulse: 82  Temp: 98.1 F (36.7 C)  TempSrc: Temporal  SpO2: 98%  Weight: 178 lb (80.7 kg)  Height: 5' 3.25" (1.607 m)     Physical Exam Vitals reviewed.  Constitutional:      Appearance: He is well-developed.  HENT:     Head: Normocephalic and atraumatic.     Right Ear: External ear normal.     Left Ear: External ear normal.  Eyes:  Conjunctiva/sclera: Conjunctivae normal.     Pupils: Pupils are equal, round, and reactive to light.  Neck:      Thyroid: No thyromegaly.  Cardiovascular:     Rate and Rhythm: Normal rate and regular rhythm.     Heart sounds: Normal heart sounds.  Pulmonary:     Effort: Pulmonary effort is normal. No respiratory distress.     Breath sounds: Normal breath sounds. No wheezing.  Abdominal:     General: There is no distension.     Palpations: Abdomen is soft.     Tenderness: There is no abdominal tenderness.  Musculoskeletal:        General: No tenderness. Normal range of motion.     Cervical back: Normal range of motion and neck supple.     Comments: Reports area of prior discomfort in the right rhomboid region, trapezius area.  No bony tenderness.  Pain-free cervical motion, shoulder motion and no midline bony tenderness of cervical or thoracic spine.  Lymphadenopathy:     Cervical: No cervical adenopathy.  Skin:    General: Skin is warm and dry.  Neurological:     Mental Status: He is alert and oriented to person, place, and time.     Deep Tendon Reflexes: Reflexes are normal and symmetric.  Psychiatric:        Behavior: Behavior normal.        Assessment & Plan:  Ricky Diaz is a 47 y.o. male . Annual physical exam - Plan: labetalol (NORMODYNE) 100 MG tablet, CBC with Differential/Platelet, Comprehensive metabolic panel, Lipid panel, PSA, TSH, Hemoglobin A1c  - -anticipatory guidance as below in AVS, screening labs above. Health maintenance items as above in HPI discussed/recommended as applicable.   -Upper back symptoms likely rhomboid strain or spasm, possible trapezius spasm.  Reassuring exam.  Stretches, range of motion, symptomatic care with RTC precautions given.  Essential hypertension - Plan: labetalol (NORMODYNE) 100 MG tablet, CBC with Differential/Platelet  -Stable on current regimen, continue same.  Type 2 diabetes mellitus without complication, without long-term current use of insulin (HCC) - Plan: Comprehensive metabolic panel, Hemoglobin A1c  -Check labs, consider  extended release metformin total dose 1000 mg daily.  Will wait on A1c results first.  Hyperlipidemia, unspecified hyperlipidemia type - Plan: Comprehensive metabolic panel, Lipid panel  -Tolerating current regimen, continue same.  Check labs and adjust plan accordingly.  Screening for prostate cancer - Plan: PSA  Screening for thyroid disorder - Plan: TSH  Need for pneumococcal vaccination - Plan: Pneumococcal conjugate vaccine 20-valent (Prevnar 20)  Need for diphtheria-tetanus-pertussis (Tdap) vaccine - Plan: Tdap vaccine greater than or equal to 7yo IM   Meds ordered this encounter  Medications   labetalol (NORMODYNE) 100 MG tablet    Sig: Take 1 tablet (100 mg total) by mouth 2 (two) times daily.    Dispense:  180 tablet    Refill:  1   Patient Instructions  If blood sugar still elevated, we could increase the dose of metformin, but use the extended release dose - once per day. I will let you know once I see labs.  I will order the Cologuard colon cancer screening test.  Tetanus and pneumonia vaccines were updated today.  Please call and schedule eye doctor appointment.  Try gentle range of motion of neck and stretches of upper back after work.  Warm towel or hot shower can sometimes be helpful to if there is some muscle spasm.  Follow-up if that pain does not improve or  any worsening.  No medication changes at this time.  Take care!  Preventive Care 48-65 Years Old, Male Preventive care refers to lifestyle choices and visits with your health care provider that can promote health and wellness. Preventive care visits are also called wellness exams. What can I expect for my preventive care visit? Counseling During your preventive care visit, your health care provider may ask about your: Medical history, including: Past medical problems. Family medical history. Current health, including: Emotional well-being. Home life and relationship well-being. Sexual  activity. Lifestyle, including: Alcohol, nicotine or tobacco, and drug use. Access to firearms. Diet, exercise, and sleep habits. Safety issues such as seatbelt and bike helmet use. Sunscreen use. Work and work Astronomer. Physical exam Your health care provider will check your: Height and weight. These may be used to calculate your BMI (body mass index). BMI is a measurement that tells if you are at a healthy weight. Waist circumference. This measures the distance around your waistline. This measurement also tells if you are at a healthy weight and may help predict your risk of certain diseases, such as type 2 diabetes and high blood pressure. Heart rate and blood pressure. Body temperature. Skin for abnormal spots. What immunizations do I need?  Vaccines are usually given at various ages, according to a schedule. Your health care provider will recommend vaccines for you based on your age, medical history, and lifestyle or other factors, such as travel or where you work. What tests do I need? Screening Your health care provider may recommend screening tests for certain conditions. This may include: Lipid and cholesterol levels. Diabetes screening. This is done by checking your blood sugar (glucose) after you have not eaten for a while (fasting). Hepatitis B test. Hepatitis C test. HIV (human immunodeficiency virus) test. STI (sexually transmitted infection) testing, if you are at risk. Lung cancer screening. Prostate cancer screening. Colorectal cancer screening. Talk with your health care provider about your test results, treatment options, and if necessary, the need for more tests. Follow these instructions at home: Eating and drinking  Eat a diet that includes fresh fruits and vegetables, whole grains, lean protein, and low-fat dairy products. Take vitamin and mineral supplements as recommended by your health care provider. Do not drink alcohol if your health care provider  tells you not to drink. If you drink alcohol: Limit how much you have to 0-2 drinks a day. Know how much alcohol is in your drink. In the [place] one drink equals one 12 oz bottle of beer (355 mL), one 5 oz glass of wine (148 mL), or one 1 oz glass of hard liquor (44 mL). Lifestyle Brush your teeth every morning and night with fluoride toothpaste. Floss one time each day. Exercise for at least 30 minutes 5 or more days each week. Do not use any products that contain nicotine or tobacco. These products include cigarettes, chewing tobacco, and vaping devices, such as e-cigarettes. If you need help quitting, ask your health care provider. Do not use drugs. If you are sexually active, practice safe sex. Use a condom or other form of protection to prevent STIs. Take aspirin only as told by your health care provider. Make sure that you understand how much to take and what form to take. Work with your health care provider to find out whether it is safe and beneficial for you to take aspirin daily. Find healthy ways to manage stress, such as: Meditation, yoga, or listening to music. Journaling. Talking to a trusted  person. Spending time with friends and family. Minimize exposure to UV radiation to reduce your risk of skin cancer. Safety Always wear your seat belt while driving or riding in a vehicle. Do not drive: If you have been drinking alcohol. Do not ride with someone who has been drinking. When you are tired or distracted. While texting. If you have been using any mind-altering substances or drugs. Wear a helmet and other protective equipment during sports activities. If you have firearms in your house, make sure you follow all gun safety procedures. What's next? Go to your health care provider once a year for an annual wellness visit. Ask your health care provider how often you should have your eyes and teeth checked. Stay up to date on all vaccines. This information is not intended to  replace advice given to you by your health care provider. Make sure you discuss any questions you have with your health care provider. Document Revised: 01/04/2021 Document Reviewed: 01/04/2021 Elsevier Patient Education  2024 [employer]        Signed,   [name], MD [facility], [facility] 08/22/23 9:20 AM

## 2023-08-27 ENCOUNTER — Telehealth: Payer: Self-pay

## 2023-08-27 NOTE — Telephone Encounter (Signed)
-----   Message from Ricky Diaz sent at 08/25/2023  3:15 PM EST ----- Call patient.  Cholesterol levels, thyroid test, prostate tests were normal.  White blood cell count borderline low but stable from previous readings, other blood counts looked okay.  Blood sugar elevated but other electrolytes looked okay.   Unfortunately 52-month blood sugar test was very high at 12.3.  At that level we will need to make some other medication changes instead of just metformin .  Likely insulin but can discuss further in person.  Please schedule appointment with me in the next 2 weeks.  If any new symptoms during that time should be seen right away, but I do not expect that to occur.

## 2023-08-28 NOTE — Telephone Encounter (Signed)
 Called patient to discuss lab work, no answer, left a message for the patient to call back to discuss or review by MyChart if that is prefered.

## 2023-08-29 NOTE — Telephone Encounter (Signed)
 Copied from CRM [id number]. Topic: Clinical - Lab/Test Results >> Aug 28, 2023  4:12 PM Jayson Michael wrote: Reason for CRM: patient returning missed call from [place], regarding lab work callback  [phone]

## 2023-08-29 NOTE — Telephone Encounter (Signed)
 Pot has been notified  Pt has been scheduled for 2/19 due to his work schedule

## 2023-09-03 LAB — HM DIABETES EYE EXAM

## 2023-09-11 ENCOUNTER — Encounter: Payer: Self-pay | Admitting: Family Medicine

## 2023-09-11 ENCOUNTER — Telehealth (INDEPENDENT_AMBULATORY_CARE_PROVIDER_SITE_OTHER): Payer: 59 | Admitting: Family Medicine

## 2023-09-11 VITALS — BP 129/84

## 2023-09-11 DIAGNOSIS — Z7984 Long term (current) use of oral hypoglycemic drugs: Secondary | ICD-10-CM | POA: Diagnosis not present

## 2023-09-11 DIAGNOSIS — M549 Dorsalgia, unspecified: Secondary | ICD-10-CM

## 2023-09-11 DIAGNOSIS — E1165 Type 2 diabetes mellitus with hyperglycemia: Secondary | ICD-10-CM | POA: Diagnosis not present

## 2023-09-11 NOTE — Progress Notes (Signed)
 Virtual Visit via Video Note  I connected with Ricky Diaz on 09/11/23 at 8:27 AM by a video enabled telemedicine application and verified that I am speaking with the correct person using two identifiers.  Patient location: home, by self.  My location: office - [place].    I discussed the limitations, risks, security and privacy concerns of performing an evaluation and management service by telephone and the availability of in person appointments. I also discussed with the patient that there may be a patient responsible charge related to this service. The patient expressed understanding and agreed to proceed, consent obtained  Chief complaint:  Chief Complaint  Patient presents with   Results    Pt is doing well, just review results today, would like to discuss blood sugar elivation    History of Present Illness: Ricky Diaz is a 48 y.o. male  Diabetes: Uncontrolled with hyperglycemia on recent visit - A1c 12.3.  Had been taking metformin daily, off for 1 month when in [place].  Recent home readings lowest 93, high 159.  Metformin once per day. No readings in 200's, but no readings in [place].  Has not tolerated BID dosing of metformin - dizziness, diarrhea with am dosing, tolerating if once at night.   No change in thirst, no blurry vision, no urinary frequency.  No n/v/abd pain.   Saw optho on 08/26/22 - no retinopathy.  Upper back feels better.  Lab Results  Component Value Date   HGBA1C 12.3 (H) 08/22/2023   HGBA1C 7.5 (H) 05/20/2023   HGBA1C 8.3 (H) 02/11/2023   Lab Results  Component Value Date   MICROALBUR <0.7 04/25/2022   LDLCALC 56 08/22/2023   CREATININE 0.97 08/22/2023      Patient Active Problem List   Diagnosis Date Noted   Type 2 diabetes mellitus without complication, without long-term current use of insulin (HCC) 11/12/2022   LFT elevation 11/12/2022   Primary hyperaldosteronism (HCC) 06/23/2015   Sleep difficulties    Hypokalemia  06/09/2014   Essential hypertension 08/06/2012   Past Medical History:  Diagnosis Date   Glucose intolerance (impaired glucose tolerance)    Hyperlipidemia    Hypertension    onset age 89.   OSA on CPAP 07/23/2012   Sleep difficulties    Past Surgical History:  Procedure Laterality Date   NO PAST SURGERIES     Allergies  Allergen Reactions   Other    Prior to Admission medications   Medication Sig Start Date End Date Taking? Authorizing Provider  amLODipine (NORVASC) 10 MG tablet TAKE 1 TABLET BY MOUTH EVERY DAY 08/06/23  Yes Shade Flood, MD  atorvastatin (LIPITOR) 10 MG tablet TAKE 1 TABLET BY MOUTH DAILY. 07/11/23  Yes Shade Flood, MD  labetalol (NORMODYNE) 100 MG tablet Take 1 tablet (100 mg total) by mouth 2 (two) times daily. 08/22/23  Yes Shade Flood, MD  lisinopril (ZESTRIL) 10 MG tablet Take 1 tablet (10 mg total) by mouth daily. 07/11/23  Yes Shade Flood, MD  metFORMIN (GLUCOPHAGE) 1000 MG tablet Take 1 tablet (1,000 mg total) by mouth daily. 08/12/23  Yes Shade Flood, MD  spironolactone (ALDACTONE) 25 MG tablet Take 1 tablet (25 mg total) by mouth daily. 02/11/23  Yes Shade Flood, MD   Social History   Socioeconomic History   Marital status: Married    Spouse name: [name]   Number of children: 4   Years of education: 14   Highest education level: Not on  file  Occupational History    Employer: [employer]  Tobacco Use   Smoking status: Never   Smokeless tobacco: Never  Vaping Use   Vaping status: Never Used  Substance and Sexual Activity   Alcohol use: Yes    Comment: rare/social   Drug use: No   Sexual activity: Yes    Partners: Female  Other Topics Concern   Not on file  Social History Narrative   Marital status: married x 2007. From [place]; [place] since 2000.      Children: 4 children; no grandchildren.      Lives: with wife ([name]) , children.      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      Tobacco: none      Alcohol: none      Drugs: none      Exercise:  Sporadic.   Patient is right-handed.   Patient has a college education.   Patient no longer drinks caffeine   Social Drivers of Corporate investment banker Strain: Low Risk  (04/06/2019)   Overall Financial Resource Strain (CARDIA)    Difficulty of Paying Living Expenses: Not hard at all  Food Insecurity: No Food Insecurity (04/06/2019)   Hunger Vital Sign    Worried About Running Out of Food in the Last Year: Never true    Ran Out of Food in the Last Year: Never true  Transportation Needs: No Transportation Needs (04/06/2019)   PRAPARE - Administrator, Civil Service (Medical): No    Lack of Transportation (Non-Medical): No  Physical Activity: Insufficiently Active (04/06/2019)   Exercise Vital Sign    Days of Exercise per Week: 4 days    Minutes of Exercise per Session: 30 min  Stress: No Stress Concern Present (04/06/2019)   [employer] of Occupational Health - Occupational Stress Questionnaire    Feeling of Stress : Only a little  Social Connections: Unknown (04/06/2019)   Social Connection and Isolation Panel [NHANES]    Frequency of Communication with Friends and Family: Three times a week    Frequency of Social Gatherings with Friends and Family: Twice a week    Attends Religious Services: Patient declined    Database administrator or Organizations: Patient declined    Attends Banker Meetings: Patient declined    Marital Status: Married  Catering manager Violence: Not At Risk (04/06/2019)   Humiliation, Afraid, Rape, and Kick questionnaire    Fear of Current or Ex-Partner: No    Emotionally Abused: No    Physically Abused: No    Sexually Abused: No    Observations/Objective: Vitals:   09/11/23 0821  BP: 129/84  Nontoxic appearance on video.  Speaking full sentences, no respiratory distress.  Appropriate responses.  Assessment and Plan: Type 2 diabetes  mellitus with hyperglycemia, without long-term current use of insulin (HCC) - Plan: POCT glycosylated hemoglobin (Hb A1C), POCT glucose (manual entry) Uncontrolled with significant elevated A1c in the 12 range but this is not correlating with his home readings.  Blood sugar slightly elevated on his CMP last visit but not at the level of an A1c of 12.  He had been off medicine when in [place] but only on metformin once a day, doubt that he would have been that high of readings.  Due to the question regarding accuracy of either his home readings or the A1c, I will have him come in for a nurse visit next week  for a point-of-care A1c, point-of-care glucose to compare to his home meter for accuracy.  Depending on those readings we will then adjust medications.  May need different med given inability to tolerate higher dosing or twice daily dosing of metformin.  Asymptomatic at this time, RTC/ER precautions given.  Upper back pain has improved.  RTC precautions given  Follow Up Instructions: 1 week for nurse visit.   I discussed the assessment and treatment plan with the patient. The patient was provided an opportunity to ask questions and all were answered. The patient agreed with the plan and demonstrated an understanding of the instructions.   The patient was advised to call back or seek an in-person evaluation if the symptoms worsen or if the condition fails to improve as anticipated.   Shade Flood, MD

## 2023-09-11 NOTE — Progress Notes (Signed)
 Attempted call to schedule, LM

## 2023-09-12 NOTE — Progress Notes (Signed)
Called left message to schedule an appt.

## 2023-09-20 ENCOUNTER — Ambulatory Visit: Payer: 59

## 2023-09-20 ENCOUNTER — Encounter: Payer: Self-pay | Admitting: Family Medicine

## 2023-09-20 ENCOUNTER — Telehealth: Payer: Self-pay

## 2023-09-20 DIAGNOSIS — E1165 Type 2 diabetes mellitus with hyperglycemia: Secondary | ICD-10-CM

## 2023-09-20 LAB — GLUCOSE, POCT (MANUAL RESULT ENTRY): POC Glucose: 153 mg/dL — AB (ref 70–99)

## 2023-09-20 LAB — POCT GLYCOSYLATED HEMOGLOBIN (HGB A1C): Hemoglobin A1C: 9.7 % — AB (ref 4.0–5.6)

## 2023-09-20 MED ORDER — LANCETS MISC. MISC: 0 refills | Status: AC

## 2023-09-20 MED ORDER — BLOOD GLUCOSE TEST VI STRP: ORAL_STRIP | 0 refills | Status: DC

## 2023-09-20 MED ORDER — LANCET DEVICE MISC: 0 refills | Status: AC

## 2023-09-20 MED ORDER — BLOOD GLUCOSE MONITORING SUPPL DEVI: 0 refills | Status: AC

## 2023-09-20 NOTE — Telephone Encounter (Signed)
 Patient was very pleased to hear his A1c improvement and we did schedule an appointment for 3 weeks and informed him of new meter he will pick up tonight   Also sent new code to sign up for MyChart per patients request.

## 2023-09-20 NOTE — Telephone Encounter (Signed)
 Patient presented today per request from visit 09/11/2023 so we can compare in office glucometer and patients to each other, results showed patients meter read 184 while the office meter showed 153  Patient did request a new meter while in office today.  Patient had 1 banana 1 hour prior to testing today. A1c tested today was 9.7%

## 2023-09-20 NOTE — Telephone Encounter (Signed)
 Results for orders placed or performed in visit on 09/20/23  POCT glucose (manual entry)   Collection Time: 09/20/23  8:26 AM  Result Value Ref Range   POC Glucose 153 (A) 70 - 99 mg/dl  POCT glycosylated hemoglobin (Hb A1C)   Collection Time: 09/20/23  8:28 AM  Result Value Ref Range   Hemoglobin A1C 9.7 (A) 4.0 - 5.6 %   HbA1c POC (<> result, manual entry)     HbA1c, POC (prediabetic range)     HbA1c, POC (controlled diabetic range)       Noted. Aic 12.3 last month. Repeat in office better, but still high. New monitor rx sent. Let's follow up in next 2-3 weeks with home readings (1-2 per day, fasting or 2 hours after meals) and we can discuss other med options then. Thanks.

## 2023-09-20 NOTE — Progress Notes (Signed)
 Patient presented today per request of Dr Neva Seat from visit 09/11/2023 so we can compare in office glucometer and patients to each other, results showed when patients meter was 184 in office was 153 will send this reading to Dr Neva Seat for his opinion though patient did request a new meter while in office today.  Patient had 1 banana 1 hour prior to testing today. A1c tested today as well per provider request and that was 9.7%

## 2023-09-20 NOTE — Addendum Note (Signed)
 Addended by: [name] R on: 09/20/2023 03:41 PM   Modules accepted: Orders

## 2023-09-25 ENCOUNTER — Encounter: Payer: Self-pay | Admitting: Family Medicine

## 2023-10-08 ENCOUNTER — Telehealth: Payer: Self-pay | Admitting: *Deleted

## 2023-10-08 NOTE — Telephone Encounter (Signed)
 Pt here with new [employer] airsense 11 machine,  received 09-19-2023, did not start using,  I checked settings cpap 12, he will start using tonight 4+hrs nightly.  Made appt with MM/NP for 12/09/2023 at 0900. He verbalized understanding.

## 2023-10-10 ENCOUNTER — Ambulatory Visit: Payer: 59 | Admitting: Family Medicine

## 2023-10-10 ENCOUNTER — Encounter: Payer: Self-pay | Admitting: Family Medicine

## 2023-10-10 VITALS — BP 120/72 | HR 81 | Temp 98.5°F | Ht 63.25 in | Wt 180.6 lb

## 2023-10-10 DIAGNOSIS — Z7984 Long term (current) use of oral hypoglycemic drugs: Secondary | ICD-10-CM

## 2023-10-10 DIAGNOSIS — E1165 Type 2 diabetes mellitus with hyperglycemia: Secondary | ICD-10-CM | POA: Diagnosis not present

## 2023-10-10 DIAGNOSIS — Z1211 Encounter for screening for malignant neoplasm of colon: Secondary | ICD-10-CM

## 2023-10-10 LAB — GLUCOSE, POCT (MANUAL RESULT ENTRY): POC Glucose: 145 mg/dL — AB (ref 70–99)

## 2023-10-10 NOTE — Patient Instructions (Signed)
 Thank you for coming in today.  Your meter is reading accurately and is very similar to what we have in the office.  Based on your home readings I do not think we need to make any medication changes at this time.  We can repeat the 30-month blood sugar test in 2 months from now since we just did one last month.  If you notice any blood sugars in the 200s or any low blood sugars in the meantime please let me know.  Take care.

## 2023-10-10 NOTE — Progress Notes (Signed)
 Subjective:  Patient ID: Ricky Diaz, male    DOB: [birth year]  Age: 48 y.o. MRN: [id number]  CC:  Chief Complaint  Patient presents with   Diabetes    Pt is doing well, brought meter     HPI Ricky Diaz presents for   Diabetes: Uncontrolled with hyperglycemia, elevated A1c in January 2012 0.3, up from 7.5 in October of last year.  He had been temporarily off his metformin when in [place] but had been back on metformin daily. Last visit in February.  Readings were improved at that time, 93-1 59 with metformin once per day.  He has not tolerated twice daily dosing of metformin. Question of accuracy of previous A1c, repeated with point-of-care on 09/20/2023 at 9.7. He brought his meter today, home readings fasting: 115 range  Postprandial: 140 range. No 200's.  No symptomatic lows. Still taking metformin once per day. No diarrhea or side effects.   Today's reading is 143 in office on his reading.   Microalbumin: due. Normal in 04/2022.  Optho, foot exam, pneumovax:  Saw optho a month ago - no concerns.   Lab Results  Component Value Date   HGBA1C 9.7 (A) 09/20/2023   HGBA1C 12.3 (H) 08/22/2023   HGBA1C 7.5 (H) 05/20/2023   Lab Results  Component Value Date   MICROALBUR <0.7 04/25/2022   LDLCALC 56 08/22/2023   CREATININE 0.97 08/22/2023   HM: Covid booster at pharmacy last fall.  Screening options with colonoscopy versus Cologuard discussed. No FH of colon CA, no personal hx of polyps. Discussed timing of repeat testing intervals if normal, as well as potential need for diagnostic Colonoscopy if positive Cologuard. Understanding expressed, and chose Colonoscopy.   History Patient Active Problem List   Diagnosis Date Noted   Type 2 diabetes mellitus without complication, without long-term current use of insulin (HCC) 11/12/2022   LFT elevation 11/12/2022   Primary hyperaldosteronism (HCC) 06/23/2015   Sleep difficulties    Hypokalemia 06/09/2014   Essential  hypertension 08/06/2012   Past Medical History:  Diagnosis Date   Glucose intolerance (impaired glucose tolerance)    Hyperlipidemia    Hypertension    onset age 69.   OSA on CPAP 07/23/2012   Sleep difficulties    Past Surgical History:  Procedure Laterality Date   NO PAST SURGERIES     Allergies  Allergen Reactions   Other    Prior to Admission medications   Medication Sig Start Date End Date Taking? Authorizing Provider  amLODipine (NORVASC) 10 MG tablet TAKE 1 TABLET BY MOUTH EVERY DAY 08/06/23  Yes Shade Flood, MD  atorvastatin (LIPITOR) 10 MG tablet TAKE 1 TABLET BY MOUTH DAILY. 07/11/23  Yes Shade Flood, MD  Blood Glucose Monitoring Suppl DEVI May substitute to any manufacturer covered by patient's insurance. Check 1-2 times per day. 09/20/23  Yes Shade Flood, MD  Glucose Blood (BLOOD GLUCOSE TEST STRIPS) STRP May substitute to any manufacturer covered by patient's insurance. Check 1-2 times per day. 09/20/23  Yes Shade Flood, MD  labetalol (NORMODYNE) 100 MG tablet Take 1 tablet (100 mg total) by mouth 2 (two) times daily. 08/22/23  Yes Shade Flood, MD  Lancet Device MISC May substitute to any manufacturer covered by patient's insurance. Check 1-2 times per day. 09/20/23  Yes Shade Flood, MD  Lancets Misc. MISC Check 1-2 times per day. May substitute to any manufacturer covered by patient's insurance. 09/20/23  Yes Shade Flood, MD  lisinopril (  ZESTRIL) 10 MG tablet Take 1 tablet (10 mg total) by mouth daily. 07/11/23  Yes Shade Flood, MD  metFORMIN (GLUCOPHAGE) 1000 MG tablet Take 1 tablet (1,000 mg total) by mouth daily. 08/12/23  Yes Shade Flood, MD  spironolactone (ALDACTONE) 25 MG tablet Take 1 tablet (25 mg total) by mouth daily. 02/11/23  Yes Shade Flood, MD   Social History   Socioeconomic History   Marital status: Married    Spouse name: [name]   Number of children: 4   Years of education: 14   Highest  education level: Not on file  Occupational History    Employer: [employer]  Tobacco Use   Smoking status: Never   Smokeless tobacco: Never  Vaping Use   Vaping status: Never Used  Substance and Sexual Activity   Alcohol use: Yes    Comment: rare/social   Drug use: No   Sexual activity: Yes    Partners: Female  Other Topics Concern   Not on file  Social History Narrative   Marital status: married x 2007. From [place]; [place] since 2000.      Children: 4 children; no grandchildren.      Lives: with wife ([name]) , children.      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      Tobacco: none      Alcohol: none      Drugs: none      Exercise:  Sporadic.   Patient is right-handed.   Patient has a college education.   Patient no longer drinks caffeine   Social Drivers of Corporate investment banker Strain: Low Risk  (04/06/2019)   Overall Financial Resource Strain (CARDIA)    Difficulty of Paying Living Expenses: Not hard at all  Food Insecurity: No Food Insecurity (04/06/2019)   Hunger Vital Sign    Worried About Running Out of Food in the Last Year: Never true    Ran Out of Food in the Last Year: Never true  Transportation Needs: No Transportation Needs (04/06/2019)   PRAPARE - Administrator, Civil Service (Medical): No    Lack of Transportation (Non-Medical): No  Physical Activity: Insufficiently Active (04/06/2019)   Exercise Vital Sign    Days of Exercise per Week: 4 days    Minutes of Exercise per Session: 30 min  Stress: No Stress Concern Present (04/06/2019)   [employer] of Occupational Health - Occupational Stress Questionnaire    Feeling of Stress : Only a little  Social Connections: Unknown (04/06/2019)   Social Connection and Isolation Panel [NHANES]    Frequency of Communication with Friends and Family: Three times a week    Frequency of Social Gatherings with Friends and Family: Twice a week    Attends Religious  Services: Patient declined    Database administrator or Organizations: Patient declined    Attends Banker Meetings: Patient declined    Marital Status: Married  Catering manager Violence: Not At Risk (04/06/2019)   Humiliation, Afraid, Rape, and Kick questionnaire    Fear of Current or Ex-Partner: No    Emotionally Abused: No    Physically Abused: No    Sexually Abused: No    Review of Systems  Per HPI.  Objective:   Vitals:   10/10/23 0908  BP: 120/72  Pulse: 81  Temp: 98.5 F (36.9 C)  TempSrc: Temporal  SpO2: 98%  Weight: 180 lb 9.6 oz (81.9  kg)  Height: 5' 3.25" (1.607 m)     Physical Exam Vitals reviewed.  Constitutional:      Appearance: He is well-developed.  HENT:     Head: Normocephalic and atraumatic.  Neck:     Vascular: No carotid bruit or JVD.  Cardiovascular:     Rate and Rhythm: Normal rate and regular rhythm.     Heart sounds: Normal heart sounds. No murmur heard. Pulmonary:     Effort: Pulmonary effort is normal.     Breath sounds: Normal breath sounds. No rales.  Musculoskeletal:     Right lower leg: No edema.     Left lower leg: No edema.  Skin:    General: Skin is warm and dry.  Neurological:     Mental Status: He is alert and oriented to person, place, and time.  Psychiatric:        Mood and Affect: Mood normal.     Results for orders placed or performed in visit on 10/10/23  POCT glucose (manual entry)   Collection Time: 10/10/23  9:47 AM  Result Value Ref Range   POC Glucose 145 (A) 70 - 99 mg/dl   In office reading 244 at time of his meter reading 143.   Assessment & Plan:  Ricky Diaz is a 48 y.o. male . Type 2 diabetes mellitus with hyperglycemia, without long-term current use of insulin (HCC) - Plan: POCT glucose (manual entry), Microalbumin / creatinine urine ratio  -Tolerating current dose of metformin with overall stable readings at home.  Consistent with in office meter.  Will continue same regimen for  now, check A1c in 2 months and then decide if additional medication is needed.  Screen for colon cancer - Plan: Ambulatory referral to Gastroenterology  -Options discussed, he chose colonoscopy, referral placed.  No orders of the defined types were placed in this encounter.  Patient Instructions  Thank you for coming in today.  Your meter is reading accurately and is very similar to what we have in the office.  Based on your home readings I do not think we need to make any medication changes at this time.  We can repeat the 24-month blood sugar test in 2 months from now since we just did one last month.  If you notice any blood sugars in the 200s or any low blood sugars in the meantime please let me know.  Take care.     Signed,   [name], MD [facility], [facility] 10/10/23 10:02 AM

## 2023-10-11 LAB — MICROALBUMIN / CREATININE URINE RATIO
Creatinine,U: 144.8 mg/dL
Microalb Creat Ratio: 7.7 mg/g (ref 0.0–30.0)
Microalb, Ur: 1.1 mg/dL (ref 0.0–1.9)

## 2023-10-15 ENCOUNTER — Encounter: Payer: Self-pay | Admitting: Family Medicine

## 2023-10-31 ENCOUNTER — Telehealth: Payer: Self-pay

## 2023-10-31 NOTE — Telephone Encounter (Signed)
 Faxed off pt's order to [facility] [phone].

## 2023-11-27 ENCOUNTER — Encounter: Payer: Self-pay | Admitting: Family Medicine

## 2023-11-27 ENCOUNTER — Ambulatory Visit: Payer: 59 | Admitting: Family Medicine

## 2023-11-27 VITALS — BP 132/84 | HR 85 | Temp 98.7°F | Ht 63.25 in | Wt 181.0 lb

## 2023-11-27 DIAGNOSIS — E1165 Type 2 diabetes mellitus with hyperglycemia: Secondary | ICD-10-CM

## 2023-11-27 DIAGNOSIS — Z7984 Long term (current) use of oral hypoglycemic drugs: Secondary | ICD-10-CM | POA: Diagnosis not present

## 2023-11-27 LAB — COMPREHENSIVE METABOLIC PANEL WITH GFR
ALT: 34 U/L (ref 0–53)
AST: 20 U/L (ref 0–37)
Albumin: 4.7 g/dL (ref 3.5–5.2)
Alkaline Phosphatase: 62 U/L (ref 39–117)
BUN: 18 mg/dL (ref 6–23)
CO2: 28 meq/L (ref 19–32)
Calcium: 10.1 mg/dL (ref 8.4–10.5)
Chloride: 101 meq/L (ref 96–112)
Creatinine, Ser: 1.03 mg/dL (ref 0.40–1.50)
GFR: 85.03 mL/min (ref >60.00)
Glucose, Bld: 131 mg/dL — ABNORMAL HIGH (ref 70–99)
Potassium: 4 meq/L (ref 3.5–5.1)
Sodium: 137 meq/L (ref 135–145)
Total Bilirubin: 0.5 mg/dL (ref 0.2–1.2)
Total Protein: 7.6 g/dL (ref 6.0–8.3)

## 2023-11-27 LAB — HEMOGLOBIN A1C: Hgb A1c MFr Bld: 7.8 % — ABNORMAL HIGH (ref 4.6–6.5)

## 2023-11-27 NOTE — Patient Instructions (Addendum)
 Thank you for coming in today. No med changes at this time. If the A1c is elevated we can discuss changes, but stay on metformin  once per day for now.   Gastroenterology has been trying to contact you to schedule colonoscopy.  Please contact them at number below: [facility] [phone] option 1  Take care!

## 2023-11-27 NOTE — Progress Notes (Signed)
 Subjective:  Patient ID: Ricky Diaz, male    DOB: October 02, 1975  Age: 48 y.o. MRN: [id number]  CC:  Chief Complaint  Patient presents with   Medical Management of Chronic Issues    Pt here for med mgnt Pt reports no concerns    HPI [name] presents for   Diabetes: Complicated by hyperglycemia, worsened control from October through January with A1c jumping from 7.5-12.3.  However significant improvement from January 30 through February 28 with repeat A1c of 9.7.  Had been temporarily off his medication [place] but then back on metformin  daily.  Improving readings in February, metformin  daily at that time as intolerant to twice daily metformin .  Home readings were stable in the 115 range fasting, postprandial 140s at his March visit.  Plan for A1c today. No side effects with daily metformin .  Home readings fasting: 105 range, no sx lows.  Postprandial: 149 as a high, usually lower.   Microalbumin: Normal ratio 10/10/2023 Optho, foot exam, pneumovax: Up-to-date He was referred to gastroenterology March 20 for colon cancer screening.  On review of his referral, it appears that patient was called on April 4 as well as May 5, and MyChart message was sent to patient May 5..  I will provide number today for him to call to schedule. Lab Results  Component Value Date   HGBA1C 9.7 (A) 09/20/2023   HGBA1C 12.3 (H) 08/22/2023   HGBA1C 7.5 (H) 05/20/2023   Lab Results  Component Value Date   MICROALBUR 1.1 10/10/2023   LDLCALC 56 08/22/2023   CREATININE 0.97 08/22/2023    History Patient Active Problem List   Diagnosis Date Noted   Type 2 diabetes mellitus without complication, without long-term current use of insulin (HCC) 11/12/2022   LFT elevation 11/12/2022   Primary hyperaldosteronism (HCC) 06/23/2015   Sleep difficulties    Hypokalemia 06/09/2014   Essential hypertension 08/06/2012   Past Medical History:  Diagnosis Date   Glucose intolerance (impaired glucose  tolerance)    Hyperlipidemia    Hypertension    onset age 32.   OSA on CPAP 07/23/2012   Sleep difficulties    Past Surgical History:  Procedure Laterality Date   NO PAST SURGERIES     Allergies  Allergen Reactions   Other    Prior to Admission medications   Medication Sig Start Date End Date Taking? Authorizing Provider  amLODipine  (NORVASC ) 10 MG tablet TAKE 1 TABLET BY MOUTH EVERY DAY 08/06/23  Yes Benjiman Bras, MD  atorvastatin  (LIPITOR) 10 MG tablet TAKE 1 TABLET BY MOUTH DAILY. 07/11/23  Yes Benjiman Bras, MD  Blood Glucose Monitoring Suppl DEVI May substitute to any manufacturer covered by patient's insurance. Check 1-2 times per day. 09/20/23  Yes Benjiman Bras, MD  Glucose Blood (BLOOD GLUCOSE TEST STRIPS) STRP May substitute to any manufacturer covered by patient's insurance. Check 1-2 times per day. 09/20/23  Yes Benjiman Bras, MD  labetalol  (NORMODYNE ) 100 MG tablet Take 1 tablet (100 mg total) by mouth 2 (two) times daily. 08/22/23  Yes Benjiman Bras, MD  Lancet Device MISC May substitute to any manufacturer covered by patient's insurance. Check 1-2 times per day. 09/20/23  Yes Benjiman Bras, MD  Lancets Misc. MISC Check 1-2 times per day. May substitute to any manufacturer covered by patient's insurance. 09/20/23  Yes Benjiman Bras, MD  lisinopril  (ZESTRIL ) 10 MG tablet Take 1 tablet (10 mg total) by mouth daily. 07/11/23  Yes Benjiman Bras,  MD  metFORMIN  (GLUCOPHAGE ) 1000 MG tablet Take 1 tablet (1,000 mg total) by mouth daily. 08/12/23  Yes Benjiman Bras, MD  spironolactone  (ALDACTONE ) 25 MG tablet Take 1 tablet (25 mg total) by mouth daily. 02/11/23  Yes Benjiman Bras, MD   Social History   Socioeconomic History   Marital status: Married    Spouse name: [name]   Number of children: 4   Years of education: 14   Highest education level: Not on file  Occupational History    Employer: [employer]  Tobacco Use    Smoking status: Never   Smokeless tobacco: Never  Vaping Use   Vaping status: Never Used  Substance and Sexual Activity   Alcohol use: Yes    Comment: rare/social   Drug use: No   Sexual activity: Yes    Partners: Female  Other Topics Concern   Not on file  Social History Narrative   Marital status: married x 2007. From [place]; [place]  since [place].      Children: 4 children; no grandchildren.      Lives: with wife ([name]) , children.      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      Tobacco: none      Alcohol: none      Drugs: none      Exercise:  Sporadic.   Patient is right-handed.   Patient has a college education.   Patient no longer drinks caffeine   Social Drivers of Corporate investment banker Strain: Low Risk  (04/06/2019)   Overall Financial Resource Strain (CARDIA)    Difficulty of Paying Living Expenses: Not hard at all  Food Insecurity: No Food Insecurity (04/06/2019)   Hunger Vital Sign    Worried About Running Out of Food in the Last Year: Never true    Ran Out of Food in the Last Year: Never true  Transportation Needs: No Transportation Needs (04/06/2019)   PRAPARE - Administrator, Civil Service (Medical): No    Lack of Transportation (Non-Medical): No  Physical Activity: Insufficiently Active (04/06/2019)   Exercise Vital Sign    Days of Exercise per Week: 4 days    Minutes of Exercise per Session: 30 min  Stress: No Stress Concern Present (04/06/2019)   [employer] of Occupational Health - Occupational Stress Questionnaire    Feeling of Stress : Only a little  Social Connections: Unknown (04/06/2019)   Social Connection and Isolation Panel [NHANES]    Frequency of Communication with Friends and Family: Three times a week    Frequency of Social Gatherings with Friends and Family: Twice a week    Attends Religious Services: Patient declined    Database administrator or Organizations: Patient declined    Attends Tax inspector Meetings: Patient declined    Marital Status: Married  Catering manager Violence: Not At Risk (04/06/2019)   Humiliation, Afraid, Rape, and Kick questionnaire    Fear of Current or Ex-Partner: No    Emotionally Abused: No    Physically Abused: No    Sexually Abused: No    Review of Systems  Constitutional:  Negative for fatigue and unexpected weight change.  Eyes:  Negative for visual disturbance.  Respiratory:  Negative for cough, chest tightness and shortness of breath.   Cardiovascular:  Negative for chest pain, palpitations and leg swelling.  Gastrointestinal:  Negative for abdominal pain and blood in stool.  Neurological:  Negative  for dizziness, light-headedness and headaches.    Objective:   Vitals:   11/27/23 0751  BP: 132/84  Pulse: 85  Temp: 98.7 F (37.1 C)  SpO2: 99%  Weight: 181 lb (82.1 kg)  Height: 5' 3.25" (1.607 m)  Has not yet taken BP med today.    Physical Exam Vitals reviewed.  Constitutional:      Appearance: He is well-developed.  HENT:     Head: Normocephalic and atraumatic.  Neck:     Vascular: No carotid bruit or JVD.  Cardiovascular:     Rate and Rhythm: Normal rate and regular rhythm.     Heart sounds: Normal heart sounds. No murmur heard. Pulmonary:     Effort: Pulmonary effort is normal.     Breath sounds: Normal breath sounds. No rales.  Musculoskeletal:     Right lower leg: No edema.     Left lower leg: No edema.  Skin:    General: Skin is warm and dry.  Neurological:     Mental Status: He is alert and oriented to person, place, and time.  Psychiatric:        Mood and Affect: Mood normal.        Assessment & Plan:  Yanuel Stantz is a 48 y.o. male . Type 2 diabetes mellitus with hyperglycemia, without long-term current use of insulin (HCC) - Plan: Comprehensive metabolic panel with GFR, Hemoglobin A1c Appears to be well-controlled based on home readings, check updated A1c, CMP, continue metformin  same dose  for now.  46-month follow-up and can check other labs at that time.  Phone number provided for gastroenterology referral  No orders of the defined types were placed in this encounter.  Patient Instructions  Thank you for coming in today. No med changes at this time. If the A1c is elevated we can discuss changes, but stay on metformin  once per day for now.   Gastroenterology has been trying to contact you to schedule colonoscopy.  Please contact them at number below: [facility] [phone] option 1  Take care!    Signed,   Caro Christmas, MD [facility], [facility] 11/27/23 9:01 AM

## 2023-12-03 ENCOUNTER — Ambulatory Visit: Payer: Self-pay | Admitting: Family Medicine

## 2023-12-04 ENCOUNTER — Encounter: Payer: Self-pay | Admitting: Gastroenterology

## 2023-12-05 ENCOUNTER — Encounter: Payer: Self-pay | Admitting: Gastroenterology

## 2023-12-05 NOTE — Progress Notes (Signed)
 Ricky Diaz

## 2023-12-08 NOTE — Progress Notes (Addendum)
 PATIENT: Ricky Diaz DOB: 1976-02-13  REASON FOR VISIT: follow up HISTORY FROM: patient HISTORY OF PRESENT ILLNESS:Dr. Dohmeier  Chief Complaint  Patient presents with   Follow-up    Pt in 4 alone Pt here for cpap f/u Pt states no questions or concerns for today's visit     Today 12/09/23:  Ricky Diaz is a 48 y.o. male with a history of OSA on CPAP. Returns today for follow-up. He reports that he got a new machine. Reports that its working well. Continues to find it beneficial. BP slightly evaluated today reports that he has not taken his BP meds this AM. DL is below.     03/05/23: Ricky Diaz is a 48 y.o. male with a history of OSA on CPAP. Returns today for follow-up. Reports that CPAP is working well. Would like a new machine. An order was sent in March.       04/03/22: Ricky Diaz is a 48 year old male with a history of obstructive sleep apnea on CPAP.  He returns today for follow-up.  His download is below.  He does state that his CPAP machine is staying exceeded motor life.  However his set up date is not clear.  He has not discussed this with his DME company.  He returns today for evaluation.    05/08/21: Ricky Diaz is a 48 year old male with a history of OSA on CPAP. He returns today for follow-up. Reports CPAP is working well. Here for annual DOT compliance.       REVIEW OF SYSTEMS: Out of a complete 14 system review of symptoms, the patient complains only of the following symptoms, and all other reviewed systems are negative.  ESS 1 FSS 11  ALLERGIES: Allergies  Allergen Reactions   Other     HOME MEDICATIONS: Outpatient Medications Prior to Visit  Medication Sig Dispense Refill   amLODipine  (NORVASC ) 10 MG tablet TAKE 1 TABLET BY MOUTH EVERY DAY 90 tablet 3   atorvastatin  (LIPITOR) 10 MG tablet TAKE 1 TABLET BY MOUTH DAILY. 90 tablet 1   Blood Glucose Monitoring Suppl DEVI May substitute to any manufacturer covered by patient's  insurance. Check 1-2 times per day. 1 each 0   Glucose Blood (BLOOD GLUCOSE TEST STRIPS) STRP May substitute to any manufacturer covered by patient's insurance. Check 1-2 times per day. 100 strip 0   labetalol  (NORMODYNE ) 100 MG tablet Take 1 tablet (100 mg total) by mouth 2 (two) times daily. 180 tablet 1   Lancet Device MISC May substitute to any manufacturer covered by patient's insurance. Check 1-2 times per day. 1 each 0   Lancets Misc. MISC Check 1-2 times per day. May substitute to any manufacturer covered by patient's insurance. 100 each 0   lisinopril  (ZESTRIL ) 10 MG tablet Take 1 tablet (10 mg total) by mouth daily. 90 tablet 3   metFORMIN  (GLUCOPHAGE ) 1000 MG tablet Take 1 tablet (1,000 mg total) by mouth daily. 90 tablet 1   spironolactone  (ALDACTONE ) 25 MG tablet Take 1 tablet (25 mg total) by mouth daily. 90 tablet 3   No facility-administered medications prior to visit.    PAST MEDICAL HISTORY: Past Medical History:  Diagnosis Date   Glucose intolerance (impaired glucose tolerance)    Hyperlipidemia    Hypertension    onset age 89.   OSA on CPAP 07/23/2012   Sleep difficulties     PAST SURGICAL HISTORY: Past Surgical History:  Procedure Laterality Date   NO PAST SURGERIES  FAMILY HISTORY: Family History  Problem Relation Age of Onset   Hypertension Brother    Sleep apnea Brother    Sleep apnea Brother     SOCIAL HISTORY: Social History   Socioeconomic History   Marital status: Married    Spouse name: [name]   Number of children: 4   Years of education: 14   Highest education level: Not on file  Occupational History    Employer: [employer]  Tobacco Use   Smoking status: Never   Smokeless tobacco: Never  Vaping Use   Vaping status: Never Used  Substance and Sexual Activity   Alcohol use: Yes    Comment: rare/social   Drug use: No   Sexual activity: Yes    Partners: Female  Other Topics Concern   Not on file  Social History  Narrative   Marital status: married x 2007. From [place]; [place].      Children: 4 children; no grandchildren.      Lives: with wife ([name]) , children.      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      Tobacco: none      Alcohol: none      Drugs: none      Exercise:  Sporadic.   Patient is right-handed.   Patient has a college education.   Patient no longer drinks caffeine   Social Drivers of Corporate investment banker Strain: Low Risk  (04/06/2019)   Overall Financial Resource Strain (CARDIA)    Difficulty of Paying Living Expenses: Not hard at all  Food Insecurity: No Food Insecurity (04/06/2019)   Hunger Vital Sign    Worried About Running Out of Food in the Last Year: Never true    Ran Out of Food in the Last Year: Never true  Transportation Needs: No Transportation Needs (04/06/2019)   PRAPARE - Administrator, Civil Service (Medical): No    Lack of Transportation (Non-Medical): No  Physical Activity: Insufficiently Active (04/06/2019)   Exercise Vital Sign    Days of Exercise per Week: 4 days    Minutes of Exercise per Session: 30 min  Stress: No Stress Concern Present (04/06/2019)   [employer] of Occupational Health - Occupational Stress Questionnaire    Feeling of Stress : Only a little  Social Connections: Unknown (04/06/2019)   Social Connection and Isolation Panel [NHANES]    Frequency of Communication with Friends and Family: Three times a week    Frequency of Social Gatherings with Friends and Family: Twice a week    Attends Religious Services: Patient declined    Database administrator or Organizations: Patient declined    Attends Banker Meetings: Patient declined    Marital Status: Married  Catering manager Violence: Not At Risk (04/06/2019)   Humiliation, Afraid, Rape, and Kick questionnaire    Fear of Current or Ex-Partner: No    Emotionally Abused: No    Physically Abused: No    Sexually Abused: No       PHYSICAL EXAM  Vitals:   12/09/23 0835  BP: (!) 140/95  Pulse: 72  Weight: 186 lb (84.4 kg)  Height: 5\' 3"  (1.6 m)     Body mass index is 32.95 kg/m.    Generalized: Well developed, in no acute distress  Chest: Lungs clear to auscultation bilaterally  Neurological examination  Mentation: Alert oriented to time, place, history taking. Follows all commands speech and language  fluent Gait and station: Gait is normal.    DIAGNOSTIC DATA (LABS, IMAGING, TESTING) - I reviewed patient records, labs, notes, testing and imaging myself where available.  Lab Results  Component Value Date   WBC 3.8 (L) 08/22/2023   HGB 13.3 08/22/2023   HCT 40.6 08/22/2023   MCV 92.4 08/22/2023   PLT 210.0 08/22/2023      Component Value Date/Time   NA 137 11/27/2023 0914   NA 139 04/08/2020 0945   K 4.0 11/27/2023 0914   CL 101 11/27/2023 0914   CO2 28 11/27/2023 0914   GLUCOSE 131 (H) 11/27/2023 0914   BUN 18 11/27/2023 0914   BUN 15 04/08/2020 0945   CREATININE 1.03 11/27/2023 0914   CREATININE 1.14 04/02/2016 0958   CALCIUM  10.1 11/27/2023 0914   PROT 7.6 11/27/2023 0914   PROT 7.3 04/08/2020 0945   ALBUMIN 4.7 11/27/2023 0914   ALBUMIN 4.7 04/08/2020 0945   AST 20 11/27/2023 0914   ALT 34 11/27/2023 0914   ALKPHOS 62 11/27/2023 0914   BILITOT 0.5 11/27/2023 0914   BILITOT <0.2 04/08/2020 0945   GFRNONAA 74 04/08/2020 0945   GFRNONAA 79 04/02/2016 0958   GFRAA 86 04/08/2020 0945   GFRAA >89 04/02/2016 0958   Lab Results  Component Value Date   CHOL 117 08/22/2023   HDL 50.50 08/22/2023   LDLCALC 56 08/22/2023   TRIG 51.0 08/22/2023   CHOLHDL 2 08/22/2023   Lab Results  Component Value Date   HGBA1C 7.8 (H) 11/27/2023   Lab Results  Component Value Date   VITAMINB12 1,027 (H) 08/20/2015   Lab Results  Component Value Date   TSH 1.08 08/22/2023      ASSESSMENT AND PLAN 48 y.o. year old male  has a past medical history of Glucose intolerance (impaired  glucose tolerance), Hyperlipidemia, Hypertension, OSA on CPAP (07/23/2012), and Sleep difficulties. here with:  OSA on CPAP  - CPAP compliance excellent - Good treatment of AHI  - Encourage patient to use CPAP nightly and > 4 hours each night - F/U in 1 year or sooner if needed     Clem Currier, MSN, NP-C 12/08/2023, 2:30 PM [facility] [address] [place], [place] [address] [phone]

## 2023-12-09 ENCOUNTER — Ambulatory Visit: Admitting: Adult Health

## 2023-12-09 ENCOUNTER — Encounter: Payer: Self-pay | Admitting: Adult Health

## 2023-12-09 VITALS — BP 140/95 | HR 72 | Ht 63.0 in | Wt 186.0 lb

## 2023-12-09 DIAGNOSIS — G4733 Obstructive sleep apnea (adult) (pediatric): Secondary | ICD-10-CM | POA: Diagnosis not present

## 2023-12-09 NOTE — Patient Instructions (Signed)
 Continue using CPAP nightly and greater than 4 hours each night If your symptoms worsen or you develop new symptoms please let us know.

## 2023-12-09 NOTE — Telephone Encounter (Signed)
-----   Message from Benjiman Bras sent at 12/08/2023  9:00 PM EDT ----- Results sent by MyChart, but appears patient has not yet reviewed those results.  Please call and make sure they have either seen note or discuss result note.  Thanks.

## 2023-12-09 NOTE — Telephone Encounter (Signed)
 Pt has been notified.

## 2023-12-11 ENCOUNTER — Encounter: Payer: Self-pay | Admitting: Gastroenterology

## 2023-12-11 ENCOUNTER — Ambulatory Visit (AMBULATORY_SURGERY_CENTER)

## 2023-12-11 ENCOUNTER — Ambulatory Visit: Admitting: Family Medicine

## 2023-12-11 VITALS — Ht 64.0 in | Wt 183.0 lb

## 2023-12-11 DIAGNOSIS — Z1211 Encounter for screening for malignant neoplasm of colon: Secondary | ICD-10-CM

## 2023-12-11 MED ORDER — NA SULFATE-K SULFATE-MG SULF 17.5-3.13-1.6 GM/177ML PO SOLN: 1.0000 | Freq: Once | ORAL | 0 refills | Status: AC

## 2023-12-11 NOTE — Progress Notes (Signed)

## 2023-12-11 NOTE — Telephone Encounter (Signed)
 Patient is questioning if he can keep his metformin  to once per day and will complete with diet and exercise?

## 2023-12-16 ENCOUNTER — Other Ambulatory Visit: Payer: Self-pay | Admitting: Family Medicine

## 2023-12-16 DIAGNOSIS — E1165 Type 2 diabetes mellitus with hyperglycemia: Secondary | ICD-10-CM

## 2023-12-26 ENCOUNTER — Other Ambulatory Visit: Payer: Self-pay | Admitting: Family Medicine

## 2023-12-26 DIAGNOSIS — E785 Hyperlipidemia, unspecified: Secondary | ICD-10-CM

## 2023-12-30 ENCOUNTER — Encounter: Admitting: Gastroenterology

## 2024-01-03 ENCOUNTER — Encounter: Payer: Self-pay | Admitting: Gastroenterology

## 2024-01-03 ENCOUNTER — Ambulatory Visit (AMBULATORY_SURGERY_CENTER): Admitting: Gastroenterology

## 2024-01-03 VITALS — BP 104/66 | HR 83 | Temp 98.2°F | Resp 29 | Ht 63.0 in | Wt 183.0 lb

## 2024-01-03 DIAGNOSIS — K6389 Other specified diseases of intestine: Secondary | ICD-10-CM | POA: Diagnosis not present

## 2024-01-03 DIAGNOSIS — D123 Benign neoplasm of transverse colon: Secondary | ICD-10-CM

## 2024-01-03 DIAGNOSIS — Z1211 Encounter for screening for malignant neoplasm of colon: Secondary | ICD-10-CM

## 2024-01-03 LAB — HM COLONOSCOPY

## 2024-01-03 MED ORDER — SODIUM CHLORIDE 0.9 % IV SOLN: 500.0000 mL | Freq: Once | INTRAVENOUS | Status: DC

## 2024-01-03 NOTE — Patient Instructions (Signed)
Thank you for letting us take care of your healthcare needs today. Please see handouts given to you on Polyps.    YOU HAD AN ENDOSCOPIC PROCEDURE TODAY AT THE [facility]:   Refer to the procedure report that was given to you for any specific questions about what was found during the examination.  If the procedure report does not answer your questions, please call your gastroenterologist to clarify.  If you requested that your care partner not be given the details of your procedure findings, then the procedure report has been included in a sealed envelope for you to review at your convenience later.  YOU SHOULD EXPECT: Some feelings of bloating in the abdomen. Passage of more gas than usual.  Walking can help get rid of the air that was put into your GI tract during the procedure and reduce the bloating. If you had a lower endoscopy (such as a colonoscopy or flexible sigmoidoscopy) you may notice spotting of blood in your stool or on the toilet paper. If you underwent a bowel prep for your procedure, you may not have a normal bowel movement for a few days.  Please Note:  You might notice some irritation and congestion in your nose or some drainage.  This is from the oxygen used during your procedure.  There is no need for concern and it should clear up in a day or so.  SYMPTOMS TO REPORT IMMEDIATELY:  Following lower endoscopy (colonoscopy or flexible sigmoidoscopy):  Excessive amounts of blood in the stool  Significant tenderness or worsening of abdominal pains  Swelling of the abdomen that is new, acute  Fever of 100F or higher   For urgent or emergent issues, a gastroenterologist can be reached at any hour by calling [phone]. Do not use MyChart messaging for urgent concerns.    DIET:  We do recommend a small meal at first, but then you may proceed to your regular diet.  Drink plenty of fluids but you should avoid alcoholic beverages for 24 hours.  ACTIVITY:  You  should plan to take it easy for the rest of today and you should NOT DRIVE or use heavy machinery until tomorrow (because of the sedation medicines used during the test).    FOLLOW UP: Our staff will call the number listed on your records the next business day following your procedure.  We will call around 7:15- 8:00 am to check on you and address any questions or concerns that you may have regarding the information given to you following your procedure. If we do not reach you, we will leave a message.     If any biopsies were taken you will be contacted by phone or by letter within the next 1-3 weeks.  Please call us at [phone] if you have not heard about the biopsies in 3 weeks.    SIGNATURES/CONFIDENTIALITY: You and/or your care partner have signed paperwork which will be entered into your electronic medical record.  These signatures attest to the fact that that the information above on your After Visit Summary has been reviewed and is understood.  Full responsibility of the confidentiality of this discharge information lies with you and/or your care-partner. 

## 2024-01-03 NOTE — Progress Notes (Signed)
 Called to room to assist during endoscopic procedure.  Patient ID and intended procedure confirmed with present staff. Received instructions for my participation in the procedure from the performing physician.

## 2024-01-03 NOTE — Progress Notes (Signed)
  History and Physical:  This patient presents for endoscopic testing for: Encounter Diagnosis  Name Primary?   Special screening for malignant neoplasms, colon Yes    Average risk for colorectal cancer.  1st screening exam.  Patient denies chronic abdominal pain, rectal bleeding, constipation or diarrhea.   Patient is otherwise without complaints or active issues today.   Past Medical History: Past Medical History:  Diagnosis Date   Diabetes mellitus without complication (HCC)    Glucose intolerance (impaired glucose tolerance)    Hyperlipidemia    Hypertension    onset age 17.   OSA on CPAP 07/23/2012   Sleep apnea    Sleep difficulties      Past Surgical History: Past Surgical History:  Procedure Laterality Date   NO PAST SURGERIES      Allergies: No Known Allergies  Outpatient Meds: Current Outpatient Medications  Medication Sig Dispense Refill   ACCU-CHEK GUIDE TEST test strip USE AS DIRECTED CHECK BLOOID SUGAR 1-2 TIMES PER DAY. 100 strip 0   amLODipine  (NORVASC ) 10 MG tablet TAKE 1 TABLET BY MOUTH EVERY DAY 90 tablet 3   atorvastatin  (LIPITOR) 10 MG tablet TAKE 1 TABLET BY MOUTH DAILY. 90 tablet 1   Blood Glucose Monitoring Suppl DEVI May substitute to any manufacturer covered by patient's insurance. Check 1-2 times per day. 1 each 0   labetalol  (NORMODYNE ) 100 MG tablet Take 1 tablet (100 mg total) by mouth 2 (two) times daily. 180 tablet 1   Lancet Device MISC May substitute to any manufacturer covered by patient's insurance. Check 1-2 times per day. 1 each 0   Lancets Misc. MISC Check 1-2 times per day. May substitute to any manufacturer covered by patient's insurance. 100 each 0   lisinopril  (ZESTRIL ) 10 MG tablet Take 1 tablet (10 mg total) by mouth daily. 90 tablet 3   metFORMIN  (GLUCOPHAGE ) 1000 MG tablet Take 1 tablet (1,000 mg total) by mouth daily. 90 tablet 1   spironolactone  (ALDACTONE ) 25 MG tablet Take 1 tablet (25 mg total) by mouth daily. 90  tablet 3   Current Facility-Administered Medications  Medication Dose Route Frequency Provider Last Rate Last Admin   0.9 %  sodium chloride infusion  500 mL Intravenous Once Danis, Chamara Dyck L III, MD          ___________________________________________________________________ Objective   Exam:  BP (!) 132/92   Pulse 80   Temp 98.2 F (36.8 C)   Resp (!) 27   Ht 5' 3 (1.6 m)   Wt 183 lb (83 kg)   SpO2 99%   BMI 32.42 kg/m   CV: regular , S1/S2 Resp: clear to auscultation bilaterally, normal RR and effort noted GI: soft, no tenderness, with active bowel sounds.   Assessment: Encounter Diagnosis  Name Primary?   Special screening for malignant neoplasms, colon Yes     Plan: Colonoscopy   The benefits and risks of the planned procedure(s) were described in detail with the patient or (when appropriate) their health care proxy.  Risks were outlined as including, but not limited to, bleeding, infection, perforation, adverse medication reaction leading to cardiac or pulmonary decompensation, pancreatitis (if ERCP).  The limitation of incomplete mucosal visualization was also discussed.  No guarantees or warranties were given.  The patient is appropriate for an endoscopic procedure in the ambulatory setting.   - Lorella Roles, MD

## 2024-01-03 NOTE — Progress Notes (Signed)
 Pt's states no medical or surgical changes since previsit or office visit.

## 2024-01-03 NOTE — Progress Notes (Signed)
 Report to PACU, RN, vss, BBS= Clear.

## 2024-01-03 NOTE — Op Note (Signed)
 [facility] Patient Name: Ricky Diaz Procedure Date: 01/03/2024 1:55 PM MRN: [id number] Endoscopist: Ace Abu L. Dominic Friendly , MD, [id number] Age: 48 Referring MD:  Date of Birth: [birth year] Gender: Male Account #: [id number] Procedure:                Colonoscopy Indications:              Screening for colorectal malignant neoplasm, This                            is the patient's first colonoscopy Medicines:                Monitored Anesthesia Care Procedure:                Pre-Anesthesia Assessment:                           - Prior to the procedure, a History and Physical                            was performed, and patient medications and                            allergies were reviewed. The patient's tolerance of                            previous anesthesia was also reviewed. The risks                            and benefits of the procedure and the sedation                            options and risks were discussed with the patient.                            All questions were answered, and informed consent                            was obtained. Prior Anticoagulants: The patient has                            taken no anticoagulant or antiplatelet agents. ASA                            Grade Assessment: II - A patient with mild systemic                            disease. After reviewing the risks and benefits,                            the patient was deemed in satisfactory condition to                            undergo the procedure.  After obtaining informed consent, the colonoscope                            was passed under direct vision. Throughout the                            procedure, the patient's blood pressure, pulse, and                            oxygen saturations were monitored continuously. The                            CF [id number] #[id number] was introduced through the anus                            and advanced to the  the cecum, identified by                            appendiceal orifice and ileocecal valve. The                            colonoscopy was performed without difficulty. The                            patient tolerated the procedure well. The quality                            of the bowel preparation was good. The ileocecal                            valve, appendiceal orifice, and rectum were                            photographed. Scope In: 2:18:52 PM Scope Out: 2:31:43 PM Scope Withdrawal Time: 0 hours 9 minutes 44 seconds  Total Procedure Duration: 0 hours 12 minutes 51 seconds  Findings:                 The perianal and digital rectal examinations were                            normal.                           Repeat examination of right colon under NBI                            performed.                           A diminutive polyp was found in the transverse                            colon. The polyp was semi-sessile. The polyp was  removed with a cold snare. Resection and retrieval                            were complete.                           The exam was otherwise without abnormality on                            direct and retroflexion views. Complications:            No immediate complications. Estimated Blood Loss:     Estimated blood loss was minimal. Impression:               - One diminutive polyp in the transverse colon,                            removed with a cold snare. Resected and retrieved.                           - The examination was otherwise normal on direct                            and retroflexion views. Recommendation:           - Patient has a contact number available for                            emergencies. The signs and symptoms of potential                            delayed complications were discussed with the                            patient. Return to normal activities tomorrow.                             Written discharge instructions were provided to the                            patient.                           - Resume previous diet.                           - Continue present medications.                           - Await pathology results.                           - Repeat colonoscopy is recommended for                            surveillance. The colonoscopy date will be  determined after pathology results from today's                            exam become available for review. Sheralyn Pinegar L. Dominic Friendly, MD 01/03/2024 2:37:52 PM This report has been signed electronically.

## 2024-01-06 ENCOUNTER — Telehealth: Payer: Self-pay

## 2024-01-06 NOTE — Telephone Encounter (Signed)
 Left message

## 2024-01-07 ENCOUNTER — Other Ambulatory Visit: Payer: Self-pay | Admitting: Family Medicine

## 2024-01-07 DIAGNOSIS — E785 Hyperlipidemia, unspecified: Secondary | ICD-10-CM

## 2024-01-08 LAB — SURGICAL PATHOLOGY

## 2024-01-09 ENCOUNTER — Ambulatory Visit: Payer: Self-pay | Admitting: Gastroenterology

## 2024-01-13 ENCOUNTER — Encounter: Payer: Self-pay | Admitting: Family Medicine

## 2024-01-20 ENCOUNTER — Telehealth: Payer: Self-pay

## 2024-01-20 NOTE — Telephone Encounter (Signed)
 Copied from CRM [id number]. Topic: Clinical - Lab/Test Results >> Jan 20, 2024  8:42 AM Ernestene P wrote: Reason for CRM: Pt would like labs result to be emailed if it could - [email or web address] -

## 2024-01-20 NOTE — Telephone Encounter (Signed)
 Called and confirmed patient wanted labs from 11/27/23 as these are the last ones we performed, confirmed email address and I have now sent out the A1c and CMP from 11/27/2023

## 2024-01-29 ENCOUNTER — Other Ambulatory Visit: Payer: Self-pay | Admitting: Family Medicine

## 2024-01-29 DIAGNOSIS — E119 Type 2 diabetes mellitus without complications: Secondary | ICD-10-CM

## 2024-03-11 ENCOUNTER — Ambulatory Visit (INDEPENDENT_AMBULATORY_CARE_PROVIDER_SITE_OTHER): Admitting: Family Medicine

## 2024-03-11 ENCOUNTER — Encounter: Payer: Self-pay | Admitting: Family Medicine

## 2024-03-11 VITALS — BP 128/60 | HR 74 | Temp 98.2°F | Resp 15 | Ht 63.0 in | Wt 184.2 lb

## 2024-03-11 DIAGNOSIS — Z7984 Long term (current) use of oral hypoglycemic drugs: Secondary | ICD-10-CM

## 2024-03-11 DIAGNOSIS — I10 Essential (primary) hypertension: Secondary | ICD-10-CM

## 2024-03-11 DIAGNOSIS — E785 Hyperlipidemia, unspecified: Secondary | ICD-10-CM

## 2024-03-11 DIAGNOSIS — E1165 Type 2 diabetes mellitus with hyperglycemia: Secondary | ICD-10-CM | POA: Diagnosis not present

## 2024-03-11 LAB — COMPREHENSIVE METABOLIC PANEL WITH GFR
ALT: 39 U/L (ref 0–53)
AST: 20 U/L (ref 0–37)
Albumin: 4.4 g/dL (ref 3.5–5.2)
Alkaline Phosphatase: 60 U/L (ref 39–117)
BUN: 10 mg/dL (ref 6–23)
CO2: 29 meq/L (ref 19–32)
Calcium: 9.4 mg/dL (ref 8.4–10.5)
Chloride: 101 meq/L (ref 96–112)
Creatinine, Ser: 0.94 mg/dL (ref 0.40–1.50)
GFR: 94.7 mL/min (ref >60.00)
Glucose, Bld: 163 mg/dL — ABNORMAL HIGH (ref 70–99)
Potassium: 4 meq/L (ref 3.5–5.1)
Sodium: 138 meq/L (ref 135–145)
Total Bilirubin: 0.5 mg/dL (ref 0.2–1.2)
Total Protein: 7.3 g/dL (ref 6.0–8.3)

## 2024-03-11 LAB — LIPID PANEL
Cholesterol: 126 mg/dL (ref 0–200)
HDL: 52.6 mg/dL (ref >39.00)
LDL Cholesterol: 62 mg/dL (ref 0–99)
NonHDL: 73.13
Total CHOL/HDL Ratio: 2
Triglycerides: 55 mg/dL (ref 0.0–149.0)
VLDL: 11 mg/dL (ref 0.0–40.0)

## 2024-03-11 LAB — HEMOGLOBIN A1C: Hgb A1c MFr Bld: 8.6 % — ABNORMAL HIGH (ref 4.6–6.5)

## 2024-03-11 MED ORDER — LISINOPRIL 10 MG PO TABS: 10.0000 mg | ORAL_TABLET | Freq: Every day | ORAL | 3 refills | Status: AC

## 2024-03-11 MED ORDER — METFORMIN HCL ER 500 MG PO TB24: 1000.0000 mg | ORAL_TABLET | Freq: Every day | ORAL | 1 refills | Status: DC

## 2024-03-11 MED ORDER — ATORVASTATIN CALCIUM 10 MG PO TABS: 10.0000 mg | ORAL_TABLET | Freq: Every day | ORAL | 1 refills | Status: AC

## 2024-03-11 MED ORDER — SPIRONOLACTONE 25 MG PO TABS: 25.0000 mg | ORAL_TABLET | Freq: Every day | ORAL | 3 refills | Status: AC

## 2024-03-11 MED ORDER — AMLODIPINE BESYLATE 10 MG PO TABS: 10.0000 mg | ORAL_TABLET | Freq: Every day | ORAL | 3 refills | Status: AC

## 2024-03-11 MED ORDER — LABETALOL HCL 100 MG PO TABS: 100.0000 mg | ORAL_TABLET | Freq: Two times a day (BID) | ORAL | 1 refills | Status: AC

## 2024-03-11 NOTE — Addendum Note (Signed)
 Addended by: Torre Pikus R on: 03/11/2024 10:27 AM   Modules accepted: Orders

## 2024-03-11 NOTE — Patient Instructions (Signed)
 I changed her metformin  to the extended release formulation which is less likely to cause stomach side effects.  Take 2/day which will be equivalent to the 1000 mg pill you are currently taking.  That will give us  the opportunity to increase medication slightly if needed and should be tolerated better than the previous formulation. No other med changes at this time.  If any concerns on labs I will let you know.  Take care!

## 2024-03-11 NOTE — Progress Notes (Signed)
 Subjective:  Patient ID: Ricky Diaz, male    DOB: 1976-06-09  Age: 48 y.o. MRN: [id number]  CC:  Chief Complaint  Patient presents with   Medical Management of Chronic Issues    Pt is doing well, no concerns, pt is not fasting     HPI [name] presents for   Diabetes: Complicated by hyperglycemia, previously worsened control but has been improving with most recent A1c in May at 7.8.  Had been off medication [place], thought to be contributing to previous decreased control.  Intolerant to twice daily dosing of metformin  (GI side effects)- able to take daily dosing.  He is on ACE inhibitor, statin. Home readings: 95-129 Postprandial: 135-150. Sometimes lower. No 200's. No sx lows.   Microalbumin: Normal ratio 10/10/2023 Optho, foot exam, pneumovax:  Up-to-date.  Lab Results  Component Value Date   HGBA1C 7.8 (H) 11/27/2023   HGBA1C 9.7 (A) 09/20/2023   HGBA1C 12.3 (H) 08/22/2023   Lab Results  Component Value Date   MICROALBUR 1.1 10/10/2023   LDLCALC 56 08/22/2023   CREATININE 1.03 11/27/2023   Hypertension: Amlodipine  10 mg daily, labetalol  100 mg twice daily, lisinopril  10 mg daily, spironolactone  25 mg daily. No side effects.  Home readings: none.  BP Readings from Last 3 Encounters:  03/11/24 128/60  01/03/24 104/66  12/09/23 (!) 140/95   Lab Results  Component Value Date   CREATININE 1.03 11/27/2023    Hyperlipidemia: Lipitor 10 mg daily, no new myalgias or side effects. Ate banana this am - 1 hr ago, otherwise fasting  Lab Results  Component Value Date   CHOL 117 08/22/2023   HDL 50.50 08/22/2023   LDLCALC 56 08/22/2023   TRIG 51.0 08/22/2023   CHOLHDL 2 08/22/2023   Lab Results  Component Value Date   ALT 34 11/27/2023   AST 20 11/27/2023   ALKPHOS 62 11/27/2023   BILITOT 0.5 11/27/2023    History Patient Active Problem List   Diagnosis Date Noted   Type 2 diabetes mellitus without complication, without long-term current use of  insulin (HCC) 11/12/2022   LFT elevation 11/12/2022   Primary hyperaldosteronism (HCC) 06/23/2015   Sleep difficulties    Hypokalemia 06/09/2014   Essential hypertension 08/06/2012   Past Medical History:  Diagnosis Date   Diabetes mellitus without complication (HCC)    Glucose intolerance (impaired glucose tolerance)    Hyperlipidemia    Hypertension    onset age 81.   OSA on CPAP 07/23/2012   Sleep apnea    Sleep difficulties    Past Surgical History:  Procedure Laterality Date   NO PAST SURGERIES     No Known Allergies Prior to Admission medications   Medication Sig Start Date End Date Taking? Authorizing Provider  ACCU-CHEK GUIDE TEST test strip USE AS DIRECTED CHECK BLOOID SUGAR 1-2 TIMES PER DAY. 12/17/23  Yes Levora Reyes SAUNDERS, MD  amLODipine  (NORVASC ) 10 MG tablet TAKE 1 TABLET BY MOUTH EVERY DAY 08/06/23  Yes Levora Reyes SAUNDERS, MD  atorvastatin  (LIPITOR) 10 MG tablet TAKE 1 TABLET BY MOUTH EVERY DAY 01/07/24  Yes Levora Reyes SAUNDERS, MD  Blood Glucose Monitoring Suppl DEVI May substitute to any manufacturer covered by patient's insurance. Check 1-2 times per day. 09/20/23  Yes Levora Reyes SAUNDERS, MD  labetalol  (NORMODYNE ) 100 MG tablet Take 1 tablet (100 mg total) by mouth 2 (two) times daily. 08/22/23  Yes Levora Reyes SAUNDERS, MD  Lancet Device MISC May substitute to any manufacturer covered by patient's insurance.  Check 1-2 times per day. 09/20/23  Yes Levora Reyes SAUNDERS, MD  Lancets Misc. MISC Check 1-2 times per day. May substitute to any manufacturer covered by patient's insurance. 09/20/23  Yes Levora Reyes SAUNDERS, MD  lisinopril  (ZESTRIL ) 10 MG tablet Take 1 tablet (10 mg total) by mouth daily. 07/11/23  Yes Levora Reyes SAUNDERS, MD  metFORMIN  (GLUCOPHAGE ) 1000 MG tablet TAKE 1 TABLET BY MOUTH EVERY DAY 01/29/24  Yes Levora Reyes SAUNDERS, MD  spironolactone  (ALDACTONE ) 25 MG tablet Take 1 tablet (25 mg total) by mouth daily. 02/11/23  Yes Levora Reyes SAUNDERS, MD   Social History    Socioeconomic History   Marital status: Married    Spouse name: Ricky Diaz   Number of children: 4   Years of education: 14   Highest education level: Not on file  Occupational History    Employer: [employer]  Tobacco Use   Smoking status: Never   Smokeless tobacco: Never  Vaping Use   Vaping status: Never Used  Substance and Sexual Activity   Alcohol use: Yes    Comment: rare/social   Drug use: No   Sexual activity: Yes    Partners: Female  Other Topics Concern   Not on file  Social History Narrative   Marital status: married x 2007. From [place]; [place]  since 2000.      Children: 4 children; no grandchildren.      Lives: with wife (Ricky Diaz) , children.      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      Tobacco: none      Alcohol: none      Drugs: none      Exercise:  Sporadic.   Patient is right-handed.   Patient has a college education.   Patient no longer drinks caffeine   Social Drivers of Corporate investment banker Strain: Low Risk  (04/06/2019)   Overall Financial Resource Strain (CARDIA)    Difficulty of Paying Living Expenses: Not hard at all  Food Insecurity: No Food Insecurity (04/06/2019)   Hunger Vital Sign    Worried About Running Out of Food in the Last Year: Never true    Ran Out of Food in the Last Year: Never true  Transportation Needs: No Transportation Needs (04/06/2019)   PRAPARE - Administrator, Civil Service (Medical): No    Lack of Transportation (Non-Medical): No  Physical Activity: Insufficiently Active (04/06/2019)   Exercise Vital Sign    Days of Exercise per Week: 4 days    Minutes of Exercise per Session: 30 min  Stress: No Stress Concern Present (04/06/2019)   [employer] of Occupational Health - Occupational Stress Questionnaire    Feeling of Stress : Only a little  Social Connections: Unknown (04/06/2019)   Social Connection and Isolation Panel    Frequency of Communication with  Friends and Family: Three times a week    Frequency of Social Gatherings with Friends and Family: Twice a week    Attends Religious Services: Patient declined    Database administrator or Organizations: Patient declined    Attends Banker Meetings: Patient declined    Marital Status: Married  Catering manager Violence: Not At Risk (04/06/2019)   Humiliation, Afraid, Rape, and Kick questionnaire    Fear of Current or Ex-Partner: No    Emotionally Abused: No    Physically Abused: No    Sexually Abused: No    Review of Systems  Constitutional:  Negative for fatigue and unexpected weight change.  Eyes:  Negative for visual disturbance.  Respiratory:  Negative for cough, chest tightness and shortness of breath.   Cardiovascular:  Negative for chest pain, palpitations and leg swelling.  Gastrointestinal:  Negative for abdominal pain and blood in stool.  Neurological:  Negative for dizziness, light-headedness and headaches.     Objective:   Vitals:   03/11/24 0943  BP: 128/60  Pulse: 74  Resp: 15  Temp: 98.2 F (36.8 C)  TempSrc: Temporal  SpO2: 99%  Weight: 184 lb 3.2 oz (83.6 kg)  Height: 5' 3 (1.6 m)     Physical Exam Vitals reviewed.  Constitutional:      Appearance: He is well-developed.  HENT:     Head: Normocephalic and atraumatic.  Neck:     Vascular: No carotid bruit or JVD.  Cardiovascular:     Rate and Rhythm: Normal rate and regular rhythm.     Heart sounds: Normal heart sounds. No murmur heard. Pulmonary:     Effort: Pulmonary effort is normal.     Breath sounds: Normal breath sounds. No rales.  Musculoskeletal:     Right lower leg: No edema.     Left lower leg: No edema.  Skin:    General: Skin is warm and dry.  Neurological:     Mental Status: He is alert and oriented to person, place, and time.  Psychiatric:        Mood and Affect: Mood normal.        Assessment & Plan:  Nathanael Rolf is a 48 y.o. male . Hyperlipidemia,  unspecified hyperlipidemia type - Plan: atorvastatin  (LIPITOR) 10 MG tablet  -  Stable, tolerating current regimen. Medications refilled. Labs pending as above.   Essential hypertension - Plan: labetalol  (NORMODYNE ) 100 MG tablet, lisinopril  (ZESTRIL ) 10 MG tablet, spironolactone  (ALDACTONE ) 25 MG tablet, amLODipine  (NORVASC ) 10 MG tablet  -  Stable, tolerating current regimen. Medications refilled. Labs pending as above.   Type 2 diabetes mellitus with hyperglycemia, without long-term current use of insulin (HCC) - Plan: metFORMIN  (GLUCOPHAGE -XR) 500 MG 24 hr tablet  - As he is only taking it 1000 g metformin  once per day will switch to the extended release formulation, which may give us  opportunity to increase slightly without gastrointestinal side effects experienced on short acting.  Will continue 1000 mg total dosing for now, check labs and adjust plan accordingly.  60-month follow-up until stable A1c's then will transition to 102-month follow-up.  Meds ordered this encounter  Medications   metFORMIN  (GLUCOPHAGE -XR) 500 MG 24 hr tablet    Sig: Take 2 tablets (1,000 mg total) by mouth daily with breakfast.    Dispense:  180 tablet    Refill:  1   atorvastatin  (LIPITOR) 10 MG tablet    Sig: Take 1 tablet (10 mg total) by mouth daily.    Dispense:  90 tablet    Refill:  1    DX Code Needed  .   labetalol  (NORMODYNE ) 100 MG tablet    Sig: Take 1 tablet (100 mg total) by mouth 2 (two) times daily.    Dispense:  180 tablet    Refill:  1   lisinopril  (ZESTRIL ) 10 MG tablet    Sig: Take 1 tablet (10 mg total) by mouth daily.    Dispense:  90 tablet    Refill:  3   spironolactone  (ALDACTONE ) 25 MG tablet    Sig: Take 1 tablet (25 mg total)  by mouth daily.    Dispense:  90 tablet    Refill:  3   amLODipine  (NORVASC ) 10 MG tablet    Sig: Take 1 tablet (10 mg total) by mouth daily.    Dispense:  90 tablet    Refill:  3    DX Code Needed  .   Patient Instructions  I changed her metformin   to the extended release formulation which is less likely to cause stomach side effects.  Take 2/day which will be equivalent to the 1000 mg pill you are currently taking.  That will give us  the opportunity to increase medication slightly if needed and should be tolerated better than the previous formulation. No other med changes at this time.  If any concerns on labs I will let you know.  Take care!    Signed,   Reyes Pines, MD [facility], [facility] 03/11/24 10:20 AM

## 2024-03-17 ENCOUNTER — Ambulatory Visit: Payer: Self-pay | Admitting: Family Medicine

## 2024-03-17 NOTE — Progress Notes (Signed)
 Lab results have been discussed.   Verbalized understanding? Yes  Are there any questions? No   Scheduled appt 03/25/24 11am

## 2024-03-25 ENCOUNTER — Ambulatory Visit: Admitting: Family Medicine

## 2024-03-25 VITALS — BP 124/70 | HR 70 | Temp 98.0°F | Ht 63.0 in | Wt 184.4 lb

## 2024-03-25 DIAGNOSIS — Z7984 Long term (current) use of oral hypoglycemic drugs: Secondary | ICD-10-CM | POA: Diagnosis not present

## 2024-03-25 DIAGNOSIS — E1165 Type 2 diabetes mellitus with hyperglycemia: Secondary | ICD-10-CM

## 2024-03-25 DIAGNOSIS — Z23 Encounter for immunization: Secondary | ICD-10-CM

## 2024-03-25 LAB — GLUCOSE, POCT (MANUAL RESULT ENTRY): POC Glucose: 130 mg/dL — AB (ref 70–99)

## 2024-03-25 MED ORDER — EMPAGLIFLOZIN 10 MG PO TABS: 10.0000 mg | ORAL_TABLET | Freq: Every day | ORAL | 1 refills | Status: DC

## 2024-03-25 NOTE — Patient Instructions (Signed)
 Thanks for coming in today.  I did send in a new medication for diabetes called Jardiance , take that once per day.  Watch for signs of urinary tract infection or yeast infection around the groin as we discussed but that would be unlikely.  If any new side effects on the medicine let me know.  You will add this to the metformin , so do not stop any meds.  You can take the metformin  in the evening if that has been better tolerated.  Recheck in 3 months but let me know if there are questions in the meantime.  Take care!  Type 2 Diabetes Mellitus, Self-Care, Adult Caring for yourself after you have been diagnosed with type 2 diabetes (type 2 diabetes mellitus) means keeping your blood sugar (glucose) under control with a balance of: Nutrition. Exercise. Lifestyle changes. Medicines or insulin, if needed. Support from your team of health care providers and others. What are the risks? Having type 2 diabetes can put you at risk for other long-term (chronic) conditions, such as heart disease and kidney disease. Your health care provider may prescribe medicines to help prevent complications from diabetes. How to monitor your blood glucose  Check your blood glucose every day or as often as told by your health care provider. Have your A1C (hemoglobin A1C) level checked two or more times a year, or as often as told by your health care provider. Your health care provider will set personalized treatment goals for you. Generally, the goal of treatment is to maintain the following blood glucose levels: Before meals: 80-130 mg/dL (4.4-7.2 mmol/L). After meals: below 180 mg/dL (10 mmol/L). A1C level: less than 7%. How to manage hyperglycemia and hypoglycemia Hyperglycemia symptoms Hyperglycemia, also called high blood glucose, occurs when blood glucose is too high. Make sure you know the early signs of hyperglycemia, such as: Increased thirst. Hunger. Feeling very tired. Needing to urinate more often than  usual. Blurry vision. Hypoglycemia symptoms Hypoglycemia, also called low blood glucose, occurs with a blood glucose level at or below 70 mg/dL (3.9 mmol/L). Diabetes medicines lower your blood glucose and can cause hypoglycemia. The risk for hypoglycemia increases during or after exercise, during sleep, during illness, and when skipping meals or not eating for a long time (fasting). It is important to know the symptoms of hypoglycemia and treat it right away. Always have a 15-gram rapid-acting carbohydrate snack with you to treat low blood glucose. Family members and close friends should also know the symptoms and understand how to treat hypoglycemia, in case you are not able to treat yourself. Symptoms may include: Hunger. Anxiety. Sweating and feeling clammy. Dizziness or feeling light-headed. Sleepiness. Increased heart rate. Irritability. Tingling or numbness around the mouth, lips, or tongue. Restless sleep. Severe hypoglycemia is when your blood glucose level is at or below 54 mg/dL (3 mmol/L). Severe hypoglycemia is an emergency. Do not wait to see if the symptoms will go away. Get medical help right away. Call your local emergency services (911 in the [place] Do not drive yourself to the hospital. If you have severe hypoglycemia and you cannot eat or drink, you may need glucagon. A family member or close friend should learn how to check your blood glucose and how to give you glucagon. Ask your health care provider if you need to have an emergency glucagon kit available. Follow these instructions at home: Medicines Take prescribed insulin or diabetes medicines as told by your health care provider. Do not run out of insulin or other  diabetes medicines. Plan ahead so you always have these available. If you use insulin, adjust your dosage based on your physical activity and what foods you eat. Your health care provider will tell you how to adjust your dosage. Take over-the-counter and  prescription medicines only as told by your health care provider. Eating and drinking  What you eat and drink affects your blood glucose and your insulin dosage. Making good choices helps to control your diabetes and prevent other health problems. A healthy meal plan includes eating lean proteins, complex carbohydrates, fresh fruits and vegetables, low-fat dairy products, and healthy fats. Make an appointment to see a registered dietitian to help you create an eating plan that is right for you. Make sure that you: Follow instructions from your health care provider about eating or drinking restrictions. Drink enough fluid to keep your urine pale yellow. Keep a record of the carbohydrates that you eat. Do this by reading food labels and learning the standard serving sizes of foods. Follow your sick-day plan whenever you cannot eat or drink as usual. Make this plan in advance with your health care provider.  Activity Stay active. Exercise regularly, as told by your health care provider. This may include: Stretching and doing strength exercises, such as yoga or weight lifting, two or more times a week. Doing 150 minutes or more of moderate-intensity or vigorous-intensity exercise each week. This could be brisk walking, biking, or water aerobics. Spread out your activity over 3 or more days of the week. Do not go more than 2 days in a row without doing some kind of physical activity. When you start a new exercise or activity, work with your health care provider to adjust your insulin, medicines, or food intake as needed. Lifestyle Do not use any products that contain nicotine or tobacco. These products include cigarettes, chewing tobacco, and vaping devices, such as e-cigarettes. If you need help quitting, ask your health care provider. If you drink alcohol and your health care provider says that it is safe for you: Limit how much you have to: 0-1 drink a day for women who are not pregnant. 0-2  drinks a day for men. Know how much alcohol is in your drink. In the [place] one drink equals one 12 oz bottle of beer (355 mL), one 5 oz glass of wine (148 mL), or one 1 oz glass of hard liquor (44 mL). Learn to manage stress. If you need help with this, ask your health care provider. Take care of your body  Keep your immunizations up to date. In addition to getting vaccinations as told by your health care provider, it is recommended that you get vaccinated against the following illnesses: The flu (influenza). Get a flu shot every year. Pneumonia. Hepatitis B. Schedule an eye exam soon after your diagnosis, and then one time every year after that. Check your skin and feet every day for cuts, bruises, redness, blisters, or sores. Schedule a foot exam with your health care provider once every year. Brush your teeth and gums two times a day, and floss one or more times a day. Visit your dentist one or more times every 6 months. Maintain a healthy weight. General instructions Share your diabetes management plan with people in your workplace, school, and household. Carry a medical alert card or wear medical alert jewelry. Keep all follow-up visits. This is important. Questions to ask your health care provider Should I meet with a certified diabetes care and education specialist? Where can I  find a support group for people with diabetes? Where to find more information For help and guidance and for more information about diabetes, please visit: American Diabetes Association (ADA): www.diabetes.org American Association of Diabetes Care and Education Specialists (ADCES): www.diabeteseducator.org International Diabetes Federation (IDF): DCOnly.dk Summary Caring for yourself after you have been diagnosed with type 2 diabetes (type 2 diabetes mellitus) means keeping your blood sugar (glucose) under control with a balance of nutrition, exercise, lifestyle changes, and medicine. Check your blood  glucose every day, as often as told by your health care provider. Having diabetes can put you at risk for other long-term (chronic) conditions, such as heart disease and kidney disease. Your health care provider may prescribe medicines to help prevent complications from diabetes. Share your diabetes management plan with people in your workplace, school, and household. Keep all follow-up visits. This is important. This information is not intended to replace advice given to you by your health care provider. Make sure you discuss any questions you have with your health care provider. Document Revised: 12/07/2020 Document Reviewed: 12/07/2020 [employer] Patient Education  2024 [employer].

## 2024-03-25 NOTE — Progress Notes (Unsigned)
 Subjective:  Patient ID: Ricky Diaz, male    DOB: [birth year]  Age: 48 y.o. MRN: [id number]  CC:  Chief Complaint  Patient presents with   Diabetes    Change in medication, pt reports doing okay, no concerns from patient today     HPI Ricky Diaz presents for   Diabetes: Complicated by hyperglycemia, last visit August 20.  Daily dose of metformin  at that time as intolerant to twice daily dosing due to GI side effects, but was taking 1000 mg total per day.  Unfortunately A1c had increased from 7.8-8.6. Did change his metformin  to extended release formulation. No new side effects. No diarrhea or side effects. Some fatigue in the morning after dose- felt better taking at night - plans to take with dinner instead.  Home readings -130, 97, variable readings. No 200's. Highest 169.  Last ate 3 hours ago. Banana only.   Lab Results  Component Value Date   HGBA1C 8.6 (H) 03/11/2024   HGBA1C 7.8 (H) 11/27/2023   HGBA1C 9.7 (A) 09/20/2023   Lab Results  Component Value Date   MICROALBUR 1.1 10/10/2023   LDLCALC 62 03/11/2024   CREATININE 0.94 03/11/2024    History Patient Active Problem List   Diagnosis Date Noted   Type 2 diabetes mellitus without complication, without long-term current use of insulin (HCC) 11/12/2022   LFT elevation 11/12/2022   Primary hyperaldosteronism (HCC) 06/23/2015   Sleep difficulties    Hypokalemia 06/09/2014   Essential hypertension 08/06/2012   Past Medical History:  Diagnosis Date   Diabetes mellitus without complication (HCC)    Glucose intolerance (impaired glucose tolerance)    Hyperlipidemia    Hypertension    onset age 63.   OSA on CPAP 07/23/2012   Sleep apnea    Sleep difficulties    Past Surgical History:  Procedure Laterality Date   NO PAST SURGERIES     No Known Allergies Prior to Admission medications   Medication Sig Start Date End Date Taking? Authorizing Provider  ACCU-CHEK GUIDE TEST test strip USE AS DIRECTED  CHECK BLOOID SUGAR 1-2 TIMES PER DAY. 12/17/23  Yes Levora Reyes SAUNDERS, MD  amLODipine  (NORVASC ) 10 MG tablet Take 1 tablet (10 mg total) by mouth daily. 03/11/24  Yes Levora Reyes SAUNDERS, MD  atorvastatin  (LIPITOR) 10 MG tablet Take 1 tablet (10 mg total) by mouth daily. 03/11/24  Yes Levora Reyes SAUNDERS, MD  Blood Glucose Monitoring Suppl DEVI May substitute to any manufacturer covered by patient's insurance. Check 1-2 times per day. 09/20/23  Yes Levora Reyes SAUNDERS, MD  labetalol  (NORMODYNE ) 100 MG tablet Take 1 tablet (100 mg total) by mouth 2 (two) times daily. 03/11/24  Yes Levora Reyes SAUNDERS, MD  Lancet Device MISC May substitute to any manufacturer covered by patient's insurance. Check 1-2 times per day. 09/20/23  Yes Levora Reyes SAUNDERS, MD  Lancets Misc. MISC Check 1-2 times per day. May substitute to any manufacturer covered by patient's insurance. 09/20/23  Yes Levora Reyes SAUNDERS, MD  lisinopril  (ZESTRIL ) 10 MG tablet Take 1 tablet (10 mg total) by mouth daily. 03/11/24  Yes Levora Reyes SAUNDERS, MD  metFORMIN  (GLUCOPHAGE -XR) 500 MG 24 hr tablet Take 2 tablets (1,000 mg total) by mouth daily with breakfast. 03/11/24  Yes Levora Reyes SAUNDERS, MD  spironolactone  (ALDACTONE ) 25 MG tablet Take 1 tablet (25 mg total) by mouth daily. 03/11/24  Yes Levora Reyes SAUNDERS, MD   Social History   Socioeconomic History   Marital status: Married  Spouse name: Ricky Diaz   Number of children: 4   Years of education: 14   Highest education level: Not on file  Occupational History    Employer: [employer]  Tobacco Use   Smoking status: Never   Smokeless tobacco: Never  Vaping Use   Vaping status: Never Used  Substance and Sexual Activity   Alcohol use: Yes    Comment: rare/social   Drug use: No   Sexual activity: Yes    Partners: Female  Other Topics Concern   Not on file  Social History Narrative   Marital status: married x 2007. From [place]; [place].      Children: 4 children; no  grandchildren.      Lives: with wife (Ricky Diaz) , children.      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      Tobacco: none      Alcohol: none      Drugs: none      Exercise:  Sporadic.   Patient is right-handed.   Patient has a college education.   Patient no longer drinks caffeine   Social Drivers of Corporate investment banker Strain: Low Risk  (04/06/2019)   Overall Financial Resource Strain (CARDIA)    Difficulty of Paying Living Expenses: Not hard at all  Food Insecurity: No Food Insecurity (04/06/2019)   Hunger Vital Sign    Worried About Running Out of Food in the Last Year: Never true    Ran Out of Food in the Last Year: Never true  Transportation Needs: No Transportation Needs (04/06/2019)   PRAPARE - Administrator, Civil Service (Medical): No    Lack of Transportation (Non-Medical): No  Physical Activity: Insufficiently Active (04/06/2019)   Exercise Vital Sign    Days of Exercise per Week: 4 days    Minutes of Exercise per Session: 30 min  Stress: No Stress Concern Present (04/06/2019)   [employer] of Occupational Health - Occupational Stress Questionnaire    Feeling of Stress : Only a little  Social Connections: Unknown (04/06/2019)   Social Connection and Isolation Panel    Frequency of Communication with Friends and Family: Three times a week    Frequency of Social Gatherings with Friends and Family: Twice a week    Attends Religious Services: Patient declined    Database administrator or Organizations: Patient declined    Attends Banker Meetings: Patient declined    Marital Status: Married  Catering manager Violence: Not At Risk (04/06/2019)   Humiliation, Afraid, Rape, and Kick questionnaire    Fear of Current or Ex-Partner: No    Emotionally Abused: No    Physically Abused: No    Sexually Abused: No    Review of Systems Per HPI  Objective:   Vitals:   03/25/24 1110  BP: 124/70  Pulse: 70  Temp: 98 F  (36.7 C)  TempSrc: Temporal  SpO2: 97%  Weight: 184 lb 6.4 oz (83.6 kg)  Height: 5' 3 (1.6 m)     Physical Exam Vitals reviewed.  Constitutional:      Appearance: He is well-developed.  HENT:     Head: Normocephalic and atraumatic.  Neck:     Vascular: No carotid bruit or JVD.  Cardiovascular:     Rate and Rhythm: Normal rate and regular rhythm.     Heart sounds: Normal heart sounds. No murmur heard. Pulmonary:     Effort: Pulmonary effort  is normal.     Breath sounds: Normal breath sounds. No rales.  Musculoskeletal:     Right lower leg: No edema.     Left lower leg: No edema.  Skin:    General: Skin is warm and dry.  Neurological:     Mental Status: He is alert and oriented to person, place, and time.  Psychiatric:        Mood and Affect: Mood normal.        Assessment & Plan:  Ricky Diaz is a 48 y.o. male . Type 2 diabetes mellitus with hyperglycemia, without long-term current use of insulin (HCC) - Plan: empagliflozin  (JARDIANCE ) 10 MG TABS tablet, POCT glucose (manual entry)  Need for influenza vaccination - Plan: Flu vaccine trivalent PF, 6mos and older(Flulaval,Afluria,Fluarix,Fluzone)  Added Jardiance  for improved diabetic control, potential side effects and risks discussed, continue metformin , evening dosing as option, 69-month follow-up.  Flu vaccine given.  Meds ordered this encounter  Medications   empagliflozin  (JARDIANCE ) 10 MG TABS tablet    Sig: Take 1 tablet (10 mg total) by mouth daily.    Dispense:  90 tablet    Refill:  1   Patient Instructions  Thanks for coming in today.  I did send in a new medication for diabetes called Jardiance , take that once per day.  Watch for signs of urinary tract infection or yeast infection around the groin as we discussed but that would be unlikely.  If any new side effects on the medicine let me know.  You will add this to the metformin , so do not stop any meds.  You can take the metformin  in the evening if  that has been better tolerated.  Recheck in 3 months but let me know if there are questions in the meantime.  Take care!  Type 2 Diabetes Mellitus, Self-Care, Adult Caring for yourself after you have been diagnosed with type 2 diabetes (type 2 diabetes mellitus) means keeping your blood sugar (glucose) under control with a balance of: Nutrition. Exercise. Lifestyle changes. Medicines or insulin, if needed. Support from your team of health care providers and others. What are the risks? Having type 2 diabetes can put you at risk for other long-term (chronic) conditions, such as heart disease and kidney disease. Your health care provider may prescribe medicines to help prevent complications from diabetes. How to monitor your blood glucose  Check your blood glucose every day or as often as told by your health care provider. Have your A1C (hemoglobin A1C) level checked two or more times a year, or as often as told by your health care provider. Your health care provider will set personalized treatment goals for you. Generally, the goal of treatment is to maintain the following blood glucose levels: Before meals: 80-130 mg/dL (4.4-7.2 mmol/L). After meals: below 180 mg/dL (10 mmol/L). A1C level: less than 7%. How to manage hyperglycemia and hypoglycemia Hyperglycemia symptoms Hyperglycemia, also called high blood glucose, occurs when blood glucose is too high. Make sure you know the early signs of hyperglycemia, such as: Increased thirst. Hunger. Feeling very tired. Needing to urinate more often than usual. Blurry vision. Hypoglycemia symptoms Hypoglycemia, also called low blood glucose, occurs with a blood glucose level at or below 70 mg/dL (3.9 mmol/L). Diabetes medicines lower your blood glucose and can cause hypoglycemia. The risk for hypoglycemia increases during or after exercise, during sleep, during illness, and when skipping meals or not eating for a long time (fasting). It is  important to know the  symptoms of hypoglycemia and treat it right away. Always have a 15-gram rapid-acting carbohydrate snack with you to treat low blood glucose. Family members and close friends should also know the symptoms and understand how to treat hypoglycemia, in case you are not able to treat yourself. Symptoms may include: Hunger. Anxiety. Sweating and feeling clammy. Dizziness or feeling light-headed. Sleepiness. Increased heart rate. Irritability. Tingling or numbness around the mouth, lips, or tongue. Restless sleep. Severe hypoglycemia is when your blood glucose level is at or below 54 mg/dL (3 mmol/L). Severe hypoglycemia is an emergency. Do not wait to see if the symptoms will go away. Get medical help right away. Call your local emergency services (911 in the [place] Do not drive yourself to the hospital. If you have severe hypoglycemia and you cannot eat or drink, you may need glucagon. A family member or close friend should learn how to check your blood glucose and how to give you glucagon. Ask your health care provider if you need to have an emergency glucagon kit available. Follow these instructions at home: Medicines Take prescribed insulin or diabetes medicines as told by your health care provider. Do not run out of insulin or other diabetes medicines. Plan ahead so you always have these available. If you use insulin, adjust your dosage based on your physical activity and what foods you eat. Your health care provider will tell you how to adjust your dosage. Take over-the-counter and prescription medicines only as told by your health care provider. Eating and drinking  What you eat and drink affects your blood glucose and your insulin dosage. Making good choices helps to control your diabetes and prevent other health problems. A healthy meal plan includes eating lean proteins, complex carbohydrates, fresh fruits and vegetables, low-fat dairy products, and healthy fats. Make  an appointment to see a registered dietitian to help you create an eating plan that is right for you. Make sure that you: Follow instructions from your health care provider about eating or drinking restrictions. Drink enough fluid to keep your urine pale yellow. Keep a record of the carbohydrates that you eat. Do this by reading food labels and learning the standard serving sizes of foods. Follow your sick-day plan whenever you cannot eat or drink as usual. Make this plan in advance with your health care provider.  Activity Stay active. Exercise regularly, as told by your health care provider. This may include: Stretching and doing strength exercises, such as yoga or weight lifting, two or more times a week. Doing 150 minutes or more of moderate-intensity or vigorous-intensity exercise each week. This could be brisk walking, biking, or water aerobics. Spread out your activity over 3 or more days of the week. Do not go more than 2 days in a row without doing some kind of physical activity. When you start a new exercise or activity, work with your health care provider to adjust your insulin, medicines, or food intake as needed. Lifestyle Do not use any products that contain nicotine or tobacco. These products include cigarettes, chewing tobacco, and vaping devices, such as e-cigarettes. If you need help quitting, ask your health care provider. If you drink alcohol and your health care provider says that it is safe for you: Limit how much you have to: 0-1 drink a day for women who are not pregnant. 0-2 drinks a day for men. Know how much alcohol is in your drink. In the [place] one drink equals one 12 oz bottle of beer (355 mL), one  5 oz glass of wine (148 mL), or one 1 oz glass of hard liquor (44 mL). Learn to manage stress. If you need help with this, ask your health care provider. Take care of your body  Keep your immunizations up to date. In addition to getting vaccinations as told by your  health care provider, it is recommended that you get vaccinated against the following illnesses: The flu (influenza). Get a flu shot every year. Pneumonia. Hepatitis B. Schedule an eye exam soon after your diagnosis, and then one time every year after that. Check your skin and feet every day for cuts, bruises, redness, blisters, or sores. Schedule a foot exam with your health care provider once every year. Brush your teeth and gums two times a day, and floss one or more times a day. Visit your dentist one or more times every 6 months. Maintain a healthy weight. General instructions Share your diabetes management plan with people in your workplace, school, and household. Carry a medical alert card or wear medical alert jewelry. Keep all follow-up visits. This is important. Questions to ask your health care provider Should I meet with a certified diabetes care and education specialist? Where can I find a support group for people with diabetes? Where to find more information For help and guidance and for more information about diabetes, please visit: American Diabetes Association (ADA): www.diabetes.org American Association of Diabetes Care and Education Specialists (ADCES): www.diabeteseducator.org International Diabetes Federation (IDF): DCOnly.dk Summary Caring for yourself after you have been diagnosed with type 2 diabetes (type 2 diabetes mellitus) means keeping your blood sugar (glucose) under control with a balance of nutrition, exercise, lifestyle changes, and medicine. Check your blood glucose every day, as often as told by your health care provider. Having diabetes can put you at risk for other long-term (chronic) conditions, such as heart disease and kidney disease. Your health care provider may prescribe medicines to help prevent complications from diabetes. Share your diabetes management plan with people in your workplace, school, and household. Keep all follow-up visits. This is  important. This information is not intended to replace advice given to you by your health care provider. Make sure you discuss any questions you have with your health care provider. Document Revised: 12/07/2020 Document Reviewed: 12/07/2020 Elsevier Patient Education  2024 Elsevier Inc.    Signed,   Reyes Pines, MD [facility], [facility] 03/25/24 11:36 AM

## 2024-03-27 ENCOUNTER — Encounter: Payer: Self-pay | Admitting: Family Medicine

## 2024-06-15 ENCOUNTER — Encounter: Payer: Self-pay | Admitting: Family Medicine

## 2024-06-15 ENCOUNTER — Ambulatory Visit: Admitting: Family Medicine

## 2024-06-15 VITALS — BP 114/78 | HR 75 | Temp 98.2°F | Resp 12 | Ht 63.0 in | Wt 176.8 lb

## 2024-06-15 DIAGNOSIS — E1165 Type 2 diabetes mellitus with hyperglycemia: Secondary | ICD-10-CM | POA: Diagnosis not present

## 2024-06-15 DIAGNOSIS — E785 Hyperlipidemia, unspecified: Secondary | ICD-10-CM | POA: Diagnosis not present

## 2024-06-15 DIAGNOSIS — I10 Essential (primary) hypertension: Secondary | ICD-10-CM

## 2024-06-15 DIAGNOSIS — Z7984 Long term (current) use of oral hypoglycemic drugs: Secondary | ICD-10-CM

## 2024-06-15 LAB — COMPREHENSIVE METABOLIC PANEL WITH GFR
ALT: 22 U/L (ref 0–53)
AST: 13 U/L (ref 0–37)
Albumin: 4.6 g/dL (ref 3.5–5.2)
Alkaline Phosphatase: 59 U/L (ref 39–117)
BUN: 12 mg/dL (ref 6–23)
CO2: 29 meq/L (ref 19–32)
Calcium: 9.8 mg/dL (ref 8.4–10.5)
Chloride: 101 meq/L (ref 96–112)
Creatinine, Ser: 0.96 mg/dL (ref 0.40–1.50)
GFR: 92.17 mL/min (ref >60.00)
Glucose, Bld: 234 mg/dL — ABNORMAL HIGH (ref 70–99)
Potassium: 4.5 meq/L (ref 3.5–5.1)
Sodium: 136 meq/L (ref 135–145)
Total Bilirubin: 0.5 mg/dL (ref 0.2–1.2)
Total Protein: 7 g/dL (ref 6.0–8.3)

## 2024-06-15 LAB — HEMOGLOBIN A1C: Hgb A1c MFr Bld: 11.7 % — ABNORMAL HIGH (ref 4.6–6.5)

## 2024-06-15 NOTE — Patient Instructions (Signed)
 Great work on [employer] changes and the weight loss.  I do think that will help your blood sugar control.  We will see if it is enough to get the A1c below 7.  If it is not below 7 we can look at a different medication to either add to the metformin  or possibly a slight higher dose of metformin  if tolerated.  I will let you know once I see your lab results but again congratulations on the weight loss and the positive health changes

## 2024-06-15 NOTE — Progress Notes (Signed)
 Subjective:  Patient ID: Ricky Diaz, male    DOB: 09-May-1976  Age: 48 y.o. MRN: [id number]  CC:  Chief Complaint  Patient presents with   Diabetes    Jaurdiance kept patient awake and dizzy. Was not able to drive his car he was so dizzy   Hyperlipidemia   Hypertension    HPI Ricky Diaz presents for   Diabetes: Complicated by hyperglycemia, uncontrolled A1c in August.  Intolerant to twice daily dosing of metformin  due to gastrointestinal side effects, changed to extended release formulation.  Given persistent elevated readings on metformin , added Jardiance  at his September 3 visit.  He did not tolerate this medicine as above, felt dizzy at times and he felt like it kept him awake - only took once d/t side effects. Did not check blood sugar at that time.  Currently taking metformin  extended release 2/day in the evening.  Has been watching diet.  Intentional weight loss with more vegetables, better diet. Microalbumin: Normal ratio 10/10/2023 Optho, foot exam, pneumovax: Up-to-date He is on statin with Lipitor.  ACE inhibitor with lisinopril .  Lab Results  Component Value Date   HGBA1C 8.6 (H) 03/11/2024   HGBA1C 7.8 (H) 11/27/2023   HGBA1C 9.7 (A) 09/20/2023   Lab Results  Component Value Date   MICROALBUR 1.1 10/10/2023   LDLCALC 62 03/11/2024   CREATININE 0.94 03/11/2024   Wt Readings from Last 3 Encounters:  06/15/24 176 lb 12.8 oz (80.2 kg)  03/25/24 184 lb 6.4 oz (83.6 kg)  03/11/24 184 lb 3.2 oz (83.6 kg)   Hypertension: Treated with spironolactone  25 mg daily, lisinopril  10 mg daily, labetalol  100 mg twice daily, amlodipine  10 mg daily. No med side effects. Osa on cpap- consistent use.  Home readings: none.  BP Readings from Last 3 Encounters:  06/15/24 114/78  03/25/24 124/70  03/11/24 128/60   Lab Results  Component Value Date   CREATININE 0.94 03/11/2024    Hyperlipidemia: Lipitor 10 mg daily.  Denies new side effects or myalgias.  Stable LDL  in August at 87. 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   OSA on CPAP 07/23/2012   Sleep apnea    Sleep difficulties    Past Surgical History:  Procedure Laterality Date   NO PAST SURGERIES     Allergies  Allergen Reactions   Jardiance  [Empagliflozin ] Other (See Comments)    Lightheaded, dizzy, felt weak after 1 dose.    Prior to Admission medications   Medication Sig Start Date End Date Taking? Authorizing Provider  ACCU-CHEK GUIDE TEST test strip USE AS DIRECTED CHECK BLOOID SUGAR 1-2 TIMES PER DAY. 12/17/23  Yes Levora Reyes SAUNDERS,  MD  amLODipine  (NORVASC ) 10 MG tablet Take 1 tablet (10 mg total) by mouth daily. 03/11/24  Yes Levora Reyes SAUNDERS, MD  atorvastatin  (LIPITOR) 10 MG tablet Take 1 tablet (10 mg total) by mouth daily. 03/11/24  Yes Levora Reyes SAUNDERS, MD  Blood Glucose Monitoring Suppl DEVI May substitute to any manufacturer covered by patient's insurance. Check 1-2 times per day. 09/20/23  Yes Levora Reyes SAUNDERS, MD  labetalol  (NORMODYNE ) 100 MG tablet Take 1 tablet (100 mg total) by mouth 2 (two) times daily. 03/11/24  Yes Levora Reyes SAUNDERS, MD  Lancet Device MISC May substitute to any manufacturer covered by patient's  insurance. Check 1-2 times per day. 09/20/23  Yes Levora Reyes SAUNDERS, MD  Lancets Misc. MISC Check 1-2 times per day. May substitute to any manufacturer covered by patient's insurance. 09/20/23  Yes Levora Reyes SAUNDERS, MD  lisinopril  (ZESTRIL ) 10 MG tablet Take 1 tablet (10 mg total) by mouth daily. 03/11/24  Yes Levora Reyes SAUNDERS, MD  metFORMIN  (GLUCOPHAGE -XR) 500 MG 24 hr tablet Take 2 tablets (1,000 mg total) by mouth daily with breakfast. 03/11/24  Yes Levora Reyes SAUNDERS, MD  spironolactone  (ALDACTONE ) 25 MG tablet Take 1 tablet (25 mg total) by mouth daily. 03/11/24  Yes Levora Reyes SAUNDERS, MD  empagliflozin  (JARDIANCE ) 10 MG TABS tablet Take 1 tablet (10 mg total) by mouth daily. Patient not taking: Reported on 06/15/2024 03/25/24   Levora Reyes SAUNDERS, MD   Social History   Socioeconomic History   Marital status: Married    Spouse name: [name]   Number of children: 4   Years of education: 14   Highest education level: Not on file  Occupational History    Employer: [employer]  Tobacco Use   Smoking status: Never   Smokeless tobacco: Never  Vaping Use   Vaping status: Never Used  Substance and Sexual Activity   Alcohol use: Yes    Comment: rare/social   Drug use: No   Sexual activity: Yes    Partners: Female  Other Topics Concern   Not on file  Social History Narrative   Marital status: married x 2007. From [place]; [place]  since [place].      Children: 4 children; no grandchildren.      Lives: with wife ([name]) , children.      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      Tobacco: none      Alcohol: none      Drugs: none      Exercise:  Sporadic.   Patient is right-handed.   Patient has a college education.   Patient no longer drinks caffeine   Social Drivers of Corporate Investment Banker Strain: Low Risk  (04/06/2019)   Overall Financial Resource Strain (CARDIA)    Difficulty of Paying Living Expenses: Not hard at all  Food Insecurity: No  Food Insecurity (04/06/2019)   Hunger Vital Sign    Worried About Running Out of Food in the Last Year: Never true    Ran Out of Food in the Last Year: Never true  Transportation Needs: No Transportation Needs (04/06/2019)   PRAPARE - Administrator, Civil Service (Medical): No    Lack of Transportation (Non-Medical): No  Physical Activity: Insufficiently Active (04/06/2019)   Exercise Vital Sign    Days of Exercise per Week: 4 days    Minutes of Exercise per Session: 30 min  Stress: No Stress Concern Present (04/06/2019)  [employer] of Occupational Health - Occupational Stress Questionnaire    Feeling of Stress : Only a little  Social Connections: Unknown (04/06/2019)   Social Connection and Isolation Panel    Frequency of Communication with Friends and Family: Three times a week    Frequency of Social Gatherings with Friends and Family: Twice a week    Attends Religious Services: Patient declined    Database Administrator or Organizations: Patient declined    Attends Banker Meetings: Patient declined    Marital Status: Married  Catering Manager Violence: Not At Risk (04/06/2019)   Humiliation, Afraid, Rape, and Kick questionnaire    Fear of Current or Ex-Partner: No    Emotionally Abused: No    Physically Abused: No    Sexually Abused: No    Review of Systems  Constitutional:  Negative for fatigue and unexpected weight change.  Eyes:  Negative for visual disturbance.  Respiratory:  Negative for cough, chest tightness and shortness of breath.   Cardiovascular:  Negative for chest pain, palpitations and leg swelling.  Gastrointestinal:  Negative for abdominal pain and blood in stool.  Neurological:  Negative for dizziness, light-headedness and headaches.     Objective:   Vitals:   06/15/24 0911  BP: 114/78  Pulse: 75  Resp: 12  Temp: 98.2 F (36.8 C)  TempSrc: Temporal  SpO2: 98%  Weight: 176 lb 12.8 oz (80.2 kg)  Height: 5' 3 (1.6 m)      Physical Exam Vitals reviewed.  Constitutional:      Appearance: He is well-developed.  HENT:     Head: Normocephalic and atraumatic.  Neck:     Vascular: No carotid bruit or JVD.  Cardiovascular:     Rate and Rhythm: Normal rate and regular rhythm.     Heart sounds: Normal heart sounds. No murmur heard. Pulmonary:     Effort: Pulmonary effort is normal.     Breath sounds: Normal breath sounds. No rales.  Musculoskeletal:     Right lower leg: No edema.     Left lower leg: No edema.  Skin:    General: Skin is warm and dry.  Neurological:     Mental Status: He is alert and oriented to person, place, and time.  Psychiatric:        Mood and Affect: Mood normal.        Assessment & Plan:  Aryan Seide is a 48 y.o. male . Type 2 diabetes mellitus with hyperglycemia, without long-term current use of insulin (HCC)  - Unfortunately intolerant to Jardiance .  Will check A1c given his weight loss and positive health changes.  Commended on these changes.  Depending on A1c could consider addition of other medication class or a different SGLT2.  Hyperlipidemia, unspecified hyperlipidemia type  - Tolerating current dose of statin, weight loss.  Last LDL looked okay.  No changes.  Essential hypertension  - Stable with current med regimen without any side effects.  Continue same.  Check CMP.  No orders of the defined types were placed in this encounter.  Patient Instructions  [place] work on the diet changes and the weight loss.  I do think that will help your blood sugar control.  We will see if it is enough to get the A1c below 7.  If it is not below 7 we can look at a different medication to either add to the metformin  or possibly a slight higher dose of metformin  if tolerated.  I will let  you know once I see your lab results but again congratulations on the weight loss and the positive health changes    Signed,   Reyes Pines, MD [facility], [facility] 06/15/24 9:34 AM

## 2024-06-21 ENCOUNTER — Ambulatory Visit: Payer: Self-pay | Admitting: Family Medicine

## 2024-06-22 NOTE — Progress Notes (Signed)
 Called patient. He verbalized understanding of labs. Appt scheduled for this week

## 2024-06-24 ENCOUNTER — Encounter: Payer: Self-pay | Admitting: Family Medicine

## 2024-06-24 ENCOUNTER — Ambulatory Visit: Admitting: Family Medicine

## 2024-06-24 VITALS — BP 110/68 | HR 94 | Temp 97.6°F | Resp 14 | Ht 63.0 in | Wt 176.2 lb

## 2024-06-24 DIAGNOSIS — E1165 Type 2 diabetes mellitus with hyperglycemia: Secondary | ICD-10-CM

## 2024-06-24 LAB — GLUCOSE, POCT (MANUAL RESULT ENTRY): POC Glucose: 189 mg/dL — AB (ref 70–99)

## 2024-06-24 MED ORDER — TIRZEPATIDE 2.5 MG/0.5ML ~~LOC~~ SOAJ: 2.5000 mg | SUBCUTANEOUS | 1 refills | Status: DC

## 2024-06-24 NOTE — Patient Instructions (Addendum)
 Blood sugar was unfortunately much higher on your most recent 52-month test.  Some of that could be from missing some doses of medication when you were in [place], but again readings were still elevated at last visit.  I will check a lab called fructosamine which looks at more recent averages of blood sugar.  Continue metformin  2 pills/day.  Add Mounjaro injection once per week for now.  As blood sugars improved we can decrease medication but at this point we need to get the levels down.   Over the next 2 weeks please check your blood sugars fasting and 2 hours after meals.  1 or 2 readings per day and bring those results into office for your next appointment to help decide on other changes.   Tirzepatide Injection What is this medication? TIRZEPATIDE (tir ZEP a tide) treats type 2 diabetes. It works by increasing insulin levels in your body, which decreases your blood sugar (glucose). It also reduces the amount of sugar released into your blood and slows down your digestion. Changes to diet and exercise are often combined with this medication. This medicine may be used for other purposes; ask your health care provider or pharmacist if you have questions. COMMON BRAND NAME(S): MOUNJARO What should I tell my care team before I take this medication? They need to know if you have any of these conditions: Eye disease caused by diabetes Gallbladder disease Have or have had pancreatitis Having surgery Kidney disease Personal or family history of MEN 2, a condition that causes endocrine gland tumors Personal or family history of thyroid cancer Stomach or intestine problems, such as problems digesting food An unusual or allergic reaction to tirzepatide, other medications, foods, dyes, or preservatives Pregnant or trying to get pregnant Breastfeeding How should I use this medication? This medication is injected under the skin. You will be taught how to prepare and give it. It is given once every week  (every 7 days). Keep taking it unless your health care provider tells you to stop. If you use this medication with insulin, you should inject this medication and the insulin separately. Do not mix them together. Do not give the injections right next to each other. Change (rotate) injection sites with each injection. This medication comes with INSTRUCTIONS FOR USE. Ask your pharmacist for directions on how to use this medication. Read the information carefully. Talk to your pharmacist or care team if you have questions. It is important that you put your used needles and syringes in a special sharps container. Do not put them in a trash can. If you do not have a sharps container, call your pharmacist or care team to get one. A special MedGuide will be given to you by the pharmacist with each prescription and refill. Be sure to read this information carefully each time. Talk to your care team about the use of this medication in children. Special care may be needed. Overdosage: If you think you have taken too much of this medicine contact a poison control center or emergency room at once. NOTE: This medicine is only for you. Do not share this medicine with others. What if I miss a dose? If you miss a dose, take it as soon as you can unless it is more than 4 days (96 hours) late. If it is more than 4 days late, skip the missed dose. Take the next dose at the normal time. Do not take 2 doses within 3 days of each other. What may interact with  this medication? Alcohol Antiviral medications for HIV or AIDS Aspirin and aspirin-like medications Beta blockers, such as atenolol, metoprolol, propranolol Certain medications for blood pressure, heart disease, irregular heart beat Chromium Clonidine Diuretics Estrogen or progestin hormones Fenofibrate Gemfibrozil Guanethidine Isoniazid Lanreotide Male hormones or anabolic steroids MAOIs, such as Marplan, Nardil, and Parnate Medications for weight  loss Medications for allergies, asthma, cold, or cough Medications for depression, anxiety, or mental health conditions Niacin Nicotine NSAIDs, medications for pain and inflammation, such as ibuprofen or naproxen Octreotide Other medications for diabetes, such as glyburide, glipizide, or glimepiride Pasireotide Pentamidine Phenytoin Probenecid Quinolone antibiotics, such as ciprofloxacin, levofloxacin, ofloxacin Reserpine Some herbal dietary supplements Steroid medications, such as prednisone or cortisone Sulfamethoxazole; trimethoprim Thyroid hormones Warfarin This list may not describe all possible interactions. Give your health care provider a list of all the medicines, herbs, non-prescription drugs, or dietary supplements you use. Also tell them if you smoke, drink alcohol, or use illegal drugs. Some items may interact with your medicine. What should I watch for while using this medication? Visit your care team for regular checks on your progress. Tell your care team if your symptoms do not start to get better or if they get worse. You may need blood work done while you are taking this medication. Your care team will monitor your HbA1C (A1C). This test shows what your average blood sugar (glucose) level was over the past 2 to 3 months. Know the symptoms of low blood sugar and know how to treat it. Always carry a source of quick sugar with you. Examples include hard sugar candy or glucose tablets. Make sure others know that you can choke if you eat or drink if your blood sugar is too low and you are unable to care for yourself. Get medical help at once. Tell your care team if you have high blood sugar. Your medication dose may change if your body is under stress. Some types of stress that may affect your blood sugar include fever, infection, and surgery. Do not share pens or cartridges with anyone, even if the needle is changed. Each pen should only be used by one person. Sharing could  cause an infection. Wear a medical ID bracelet or chain. Carry a card that describes your condition. List the medications and doses you take on the card. Talk to your care team about your risk of cancer. You may be more at risk for certain types of cancer if you take this medication. Talk to your care team right away if you have a lump or swelling in your neck, hoarseness that does not go away, trouble swallowing, shortness of breath, or trouble breathing. Make sure you stay hydrated while taking this medication. Drink water often. Eat fruits and veggies that have a high water content. Drink more water when it is hot or you are active. Talk to your care team right away if you have fever, infection, vomiting, diarrhea, or if you sweat a lot while taking this medication. The loss of too much body fluid may make it dangerous for you to take this medication. If you are going to need surgery or a procedure, tell your care team that you are taking this medication. Estrogen and progestin hormones that you take by mouth may not work as well while you are taking this medication. Switch to a non-oral contraceptive or add a barrier contraceptive for 4 weeks after starting this medication and after each dose increase. Talk to your care team about contraceptive options.  They can help you find the option that works for you. Do not take this medication without first talking to your care team if you may be or could become pregnant. Your care team can help you find the option that works for you. Weight loss is not recommended during pregnancy. Maintaining healthy blood sugar levels can help reduce the risk of pregnancy complications. Talk to your care team if you are breastfeeding. When recommended, this medication may be taken. Its use during breastfeeding has not been well studied. Lactation may help lower your blood sugar levels. Your care team may recommend changes to your treatment plan. What side effects may I notice  from receiving this medication? Side effects that you should report to your care team as soon as possible: Allergic reactions--skin rash, itching, hives, swelling of the face, lips, tongue, or throat Change in vision Dehydration--increased thirst, dry mouth, feeling faint or lightheaded, headache, dark yellow or brown urine Fast or irregular heartbeat Gallbladder problems--severe stomach pain, nausea, vomiting, fever Kidney injury--decrease in the amount of urine, swelling of the ankles, hands, or feet Pancreatitis--severe stomach pain that spreads to your back or gets worse after eating or when touched, fever, nausea, vomiting Thyroid cancer--new mass or lump in the neck, pain or trouble swallowing, trouble breathing, hoarseness Side effects that usually do not require medical attention (report these to your care team if they continue or are bothersome): Constipation Diarrhea Loss of appetite Nausea Upset stomach This list may not describe all possible side effects. Call your doctor for medical advice about side effects. You may report side effects to FDA at 1-800-FDA-1088. Where should I keep my medication? Keep out of the reach of children and pets. Refrigeration (preferred): Store in the refrigerator. Keep this medication in the original carton until you are ready to take it. Do not freeze. Protect from light. Get rid of opened vials after use, even if there is medication left. Get rid of any unopened vials or pens after the expiration date. Room Temperature: This medication may be stored at room temperature below 30 degrees C (86 degrees F) for up to 21 days. Keep this medication in the original carton until you are ready to take it. Protect from light. Avoid exposure to extreme heat. Get rid of opened vials after use, even if there is medication left. Get rid of any unopened vials or pens after 21 days, or after they expire, whichever is first. To get rid of medications that are no longer  needed or have expired: Take the medication to a medication take-back program. Check with your pharmacy or law enforcement to find a location. If you cannot return the medication, ask your pharmacist or care team how to get rid of this medication safely. NOTE: This sheet is a summary. It may not cover all possible information. If you have questions about this medicine, talk to your doctor, pharmacist, or health care provider.  2025 Elsevier/Gold Standard (2023-06-25 00:00:00)    Type 2 Diabetes Mellitus, Self-Care, Adult Caring for yourself after you have been diagnosed with type 2 diabetes (type 2 diabetes mellitus) means keeping your blood sugar (glucose) under control with a balance of: Nutrition. Exercise. Lifestyle changes. Medicines or insulin, if needed. Support from your team of health care providers and others. What are the risks? Having type 2 diabetes can put you at risk for other long-term (chronic) conditions, such as heart disease and kidney disease. Your health care provider may prescribe medicines to help prevent complications from diabetes.  How to monitor your blood glucose  Check your blood glucose every day or as often as told by your health care provider. Have your A1C (hemoglobin A1C) level checked two or more times a year, or as often as told by your health care provider. Your health care provider will set personalized treatment goals for you. Generally, the goal of treatment is to maintain the following blood glucose levels: Before meals: 80-130 mg/dL (4.4-7.2 mmol/L). After meals: below 180 mg/dL (10 mmol/L). A1C level: less than 7%. How to manage hyperglycemia and hypoglycemia Hyperglycemia symptoms Hyperglycemia, also called high blood glucose, occurs when blood glucose is too high. Make sure you know the early signs of hyperglycemia, such as: Increased thirst. Hunger. Feeling very tired. Needing to urinate more often than usual. Blurry vision. Hypoglycemia  symptoms Hypoglycemia, also called low blood glucose, occurs with a blood glucose level at or below 70 mg/dL (3.9 mmol/L). Diabetes medicines lower your blood glucose and can cause hypoglycemia. The risk for hypoglycemia increases during or after exercise, during sleep, during illness, and when skipping meals or not eating for a long time (fasting). It is important to know the symptoms of hypoglycemia and treat it right away. Always have a 15-gram rapid-acting carbohydrate snack with you to treat low blood glucose. Family members and close friends should also know the symptoms and understand how to treat hypoglycemia, in case you are not able to treat yourself. Symptoms may include: Hunger. Anxiety. Sweating and feeling clammy. Dizziness or feeling light-headed. Sleepiness. Increased heart rate. Irritability. Tingling or numbness around the mouth, lips, or tongue. Restless sleep. Severe hypoglycemia is when your blood glucose level is at or below 54 mg/dL (3 mmol/L). Severe hypoglycemia is an emergency. Do not wait to see if the symptoms will go away. Get medical help right away. Call your local emergency services (911 in the [place] Do not drive yourself to the hospital. If you have severe hypoglycemia and you cannot eat or drink, you may need glucagon. A family member or close friend should learn how to check your blood glucose and how to give you glucagon. Ask your health care provider if you need to have an emergency glucagon kit available. Follow these instructions at home: Medicines Take prescribed insulin or diabetes medicines as told by your health care provider. Do not run out of insulin or other diabetes medicines. Plan ahead so you always have these available. If you use insulin, adjust your dosage based on your physical activity and what foods you eat. Your health care provider will tell you how to adjust your dosage. Take over-the-counter and prescription medicines only as told by  your health care provider. Eating and drinking  What you eat and drink affects your blood glucose and your insulin dosage. Making good choices helps to control your diabetes and prevent other health problems. A healthy meal plan includes eating lean proteins, complex carbohydrates, fresh fruits and vegetables, low-fat dairy products, and healthy fats. Make an appointment to see a registered dietitian to help you create an eating plan that is right for you. Make sure that you: Follow instructions from your health care provider about eating or drinking restrictions. Drink enough fluid to keep your urine pale yellow. Keep a record of the carbohydrates that you eat. Do this by reading food labels and learning the standard serving sizes of foods. Follow your sick-day plan whenever you cannot eat or drink as usual. Make this plan in advance with your health care provider.  Activity Stay  active. Exercise regularly, as told by your health care provider. This may include: Stretching and doing strength exercises, such as yoga or weight lifting, two or more times a week. Doing 150 minutes or more of moderate-intensity or vigorous-intensity exercise each week. This could be brisk walking, biking, or water aerobics. Spread out your activity over 3 or more days of the week. Do not go more than 2 days in a row without doing some kind of physical activity. When you start a new exercise or activity, work with your health care provider to adjust your insulin, medicines, or food intake as needed. Lifestyle Do not use any products that contain nicotine or tobacco. These products include cigarettes, chewing tobacco, and vaping devices, such as e-cigarettes. If you need help quitting, ask your health care provider. If you drink alcohol and your health care provider says that it is safe for you: Limit how much you have to: 0-1 drink a day for women who are not pregnant. 0-2 drinks a day for men. Know how much  alcohol is in your drink. In the [place] one drink equals one 12 oz bottle of beer (355 mL), one 5 oz glass of wine (148 mL), or one 1 oz glass of hard liquor (44 mL). Learn to manage stress. If you need help with this, ask your health care provider. Take care of your body  Keep your immunizations up to date. In addition to getting vaccinations as told by your health care provider, it is recommended that you get vaccinated against the following illnesses: The flu (influenza). Get a flu shot every year. Pneumonia. Hepatitis B. Schedule an eye exam soon after your diagnosis, and then one time every year after that. Check your skin and feet every day for cuts, bruises, redness, blisters, or sores. Schedule a foot exam with your health care provider once every year. Brush your teeth and gums two times a day, and floss one or more times a day. Visit your dentist one or more times every 6 months. Maintain a healthy weight. General instructions Share your diabetes management plan with people in your workplace, school, and household. Carry a medical alert card or wear medical alert jewelry. Keep all follow-up visits. This is important. Questions to ask your health care provider Should I meet with a certified diabetes care and education specialist? Where can I find a support group for people with diabetes? Where to find more information For help and guidance and for more information about diabetes, please visit: American Diabetes Association (ADA): www.diabetes.org American Association of Diabetes Care and Education Specialists (ADCES): www.diabeteseducator.org International Diabetes Federation (IDF): dconly.dk Summary Caring for yourself after you have been diagnosed with type 2 diabetes (type 2 diabetes mellitus) means keeping your blood sugar (glucose) under control with a balance of nutrition, exercise, lifestyle changes, and medicine. Check your blood glucose every day, as often as told by  your health care provider. Having diabetes can put you at risk for other long-term (chronic) conditions, such as heart disease and kidney disease. Your health care provider may prescribe medicines to help prevent complications from diabetes. Share your diabetes management plan with people in your workplace, school, and household. Keep all follow-up visits. This is important. This information is not intended to replace advice given to you by your health care provider. Make sure you discuss any questions you have with your health care provider. Document Revised: 12/07/2020 Document Reviewed: 12/07/2020 Elsevier Patient Education  2024 [employer].

## 2024-06-24 NOTE — Progress Notes (Signed)
 Subjective:  Patient ID: Ricky Diaz, male    DOB: 08-25-1975  Age: 48 y.o. MRN: [id number]  CC:  Chief Complaint  Patient presents with   Diabetes    Would like to discuss a1c    HPI Ricky Diaz presents for   Diabetes: Complicated by hyperglycemia.  Previously treated with metformin  extended release (GI intolerance to twice daily dosing) and added Jardiance  in September for elevated readings.  Did not tolerate Jardiance , felt dizzy at times, only took once.  He was taking metformin  extended release 2/day in the evening at his November 24 visit.  He did have some weight loss at that time but reported intentional weight loss with more vegetables and a better diet.  Unfortunately A1c was much higher at 11.7, with an office glucose 234.  Here to discuss med regimen. No n/v/abd pain/blurry vision/increased thirst. Some increased urination during day at times. . Missed a few doses in [place] October into November. 1-2 missed doses per week of his metformin .  Home readings: 159 yesterday, 111 a few weeks ago. Denies 200's.   No FH of MEN syndrome or thyroid CA. No personal hx of pancreatitis.  Does not want to take insulin.   Microalbumin: Normal ratio in March Optho, foot exam, pneumovax: Up-to-date  Lab Results  Component Value Date   HGBA1C 11.7 (H) 06/15/2024   HGBA1C 8.6 (H) 03/11/2024   HGBA1C 7.8 (H) 11/27/2023   Lab Results  Component Value Date   MICROALBUR 1.1 10/10/2023   LDLCALC 62 03/11/2024   CREATININE 0.96 06/15/2024    History Patient Active Problem List   Diagnosis Date Noted   Type 2 diabetes mellitus without complication, without long-term current use of insulin (HCC) 11/12/2022   LFT elevation 11/12/2022   Primary hyperaldosteronism 06/23/2015   Sleep difficulties    Hypokalemia 06/09/2014   Essential hypertension 08/06/2012   Past Medical History:  Diagnosis Date   Diabetes mellitus without complication (HCC)    Glucose intolerance (impaired  glucose tolerance)    Hyperlipidemia    Hypertension    onset age 63.   OSA on CPAP 07/23/2012   Sleep apnea    Sleep difficulties    Past Surgical History:  Procedure Laterality Date   NO PAST SURGERIES     Allergies  Allergen Reactions   Jardiance  [Empagliflozin ] Other (See Comments)    Lightheaded, dizzy, felt weak after 1 dose.    Prior to Admission medications   Medication Sig Start Date End Date Taking? Authorizing Provider  ACCU-CHEK GUIDE TEST test strip USE AS DIRECTED CHECK BLOOID SUGAR 1-2 TIMES PER DAY. 12/17/23  Yes Levora Reyes SAUNDERS, MD  amLODipine  (NORVASC ) 10 MG tablet Take 1 tablet (10 mg total) by mouth daily. 03/11/24  Yes Levora Reyes SAUNDERS, MD  atorvastatin  (LIPITOR) 10 MG tablet Take 1 tablet (10 mg total) by mouth daily. 03/11/24  Yes Levora Reyes SAUNDERS, MD  Blood Glucose Monitoring Suppl DEVI May substitute to any manufacturer covered by patient's insurance. Check 1-2 times per day. 09/20/23  Yes Levora Reyes SAUNDERS, MD  labetalol  (NORMODYNE ) 100 MG tablet Take 1 tablet (100 mg total) by mouth 2 (two) times daily. 03/11/24  Yes Levora Reyes SAUNDERS, MD  Lancet Device MISC May substitute to any manufacturer covered by patient's insurance. Check 1-2 times per day. 09/20/23  Yes Levora Reyes SAUNDERS, MD  Lancets Misc. MISC Check 1-2 times per day. May substitute to any manufacturer covered by patient's insurance. 09/20/23  Yes Levora Reyes SAUNDERS, MD  lisinopril  (ZESTRIL ) 10 MG tablet Take 1 tablet (10 mg total) by mouth daily. 03/11/24  Yes Levora Reyes SAUNDERS, MD  metFORMIN  (GLUCOPHAGE -XR) 500 MG 24 hr tablet Take 2 tablets (1,000 mg total) by mouth daily with breakfast. 03/11/24  Yes Levora Reyes SAUNDERS, MD  spironolactone  (ALDACTONE ) 25 MG tablet Take 1 tablet (25 mg total) by mouth daily. 03/11/24  Yes Levora Reyes SAUNDERS, MD   Social History   Socioeconomic History   Marital status: Married    Spouse name: [name]   Number of children: 4   Years of education: 14   Highest  education level: Not on file  Occupational History    Employer: [employer]  Tobacco Use   Smoking status: Never   Smokeless tobacco: Never  Vaping Use   Vaping status: Never Used  Substance and Sexual Activity   Alcohol use: Yes    Comment: rare/social   Drug use: No   Sexual activity: Yes    Partners: Female  Other Topics Concern   Not on file  Social History Narrative   Marital status: married x 2007. From [place]; [place]  since [place].      Children: 4 children; no grandchildren.      Lives: with wife ([name]) , children.      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      Tobacco: none      Alcohol: none      Drugs: none      Exercise:  Sporadic.   Patient is right-handed.   Patient has a college education.   Patient no longer drinks caffeine   Social Drivers of Corporate Investment Banker Strain: Low Risk  (04/06/2019)   Overall Financial Resource Strain (CARDIA)    Difficulty of Paying Living Expenses: Not hard at all  Food Insecurity: No Food Insecurity (04/06/2019)   Hunger Vital Sign    Worried About Running Out of Food in the Last Year: Never true    Ran Out of Food in the Last Year: Never true  Transportation Needs: No Transportation Needs (04/06/2019)   PRAPARE - Administrator, Civil Service (Medical): No    Lack of Transportation (Non-Medical): No  Physical Activity: Insufficiently Active (04/06/2019)   Exercise Vital Sign    Days of Exercise per Week: 4 days    Minutes of Exercise per Session: 30 min  Stress: No Stress Concern Present (04/06/2019)   [employer] of Occupational Health - Occupational Stress Questionnaire    Feeling of Stress : Only a little  Social Connections: Unknown (04/06/2019)   Social Connection and Isolation Panel    Frequency of Communication with Friends and Family: Three times a week    Frequency of Social Gatherings with Friends and Family: Twice a week    Attends Religious Services:  Patient declined    Database Administrator or Organizations: Patient declined    Attends Banker Meetings: Patient declined    Marital Status: Married  Catering Manager Violence: Not At Risk (04/06/2019)   Humiliation, Afraid, Rape, and Kick questionnaire    Fear of Current or Ex-Partner: No    Emotionally Abused: No    Physically Abused: No    Sexually Abused: No    Review of Systems  Constitutional:  Negative for fatigue and unexpected weight change.  Eyes:  Negative for visual disturbance.  Respiratory:  Negative for cough, chest tightness and shortness of breath.   Cardiovascular:  Negative for chest pain, palpitations and leg swelling.  Gastrointestinal:  Negative for abdominal pain and blood in stool.  Neurological:  Negative for dizziness, light-headedness and headaches.     Objective:   Vitals:   06/24/24 0804  BP: 110/68  Pulse: 94  Resp: 14  Temp: 97.6 F (36.4 C)  TempSrc: Temporal  SpO2: 99%  Weight: 176 lb 3.2 oz (79.9 kg)  Height: 5' 3 (1.6 m)     Physical Exam Vitals reviewed.  Constitutional:      Appearance: He is well-developed.  HENT:     Head: Normocephalic and atraumatic.  Neck:     Vascular: No carotid bruit or JVD.  Cardiovascular:     Rate and Rhythm: Normal rate and regular rhythm.     Heart sounds: Normal heart sounds. No murmur heard. Pulmonary:     Effort: Pulmonary effort is normal.     Breath sounds: Normal breath sounds. No rales.  Musculoskeletal:     Right lower leg: No edema.     Left lower leg: No edema.  Skin:    General: Skin is warm and dry.  Neurological:     Mental Status: He is alert and oriented to person, place, and time.  Psychiatric:        Mood and Affect: Mood normal.    Results for orders placed or performed in visit on 06/24/24  POCT glucose (manual entry)   Collection Time: 06/24/24  8:27 AM  Result Value Ref Range   POC Glucose 189 (A) 70 - 99 mg/dl       Assessment & Plan:   Ricky Diaz is a 48 y.o. male . Type 2 diabetes mellitus with hyperglycemia, without long-term current use of insulin (HCC) - Plan: POCT glucose (manual entry), Fructosamine Uncontrolled with high A1c recently as above.  Although some of that may be due to intermittent missed doses while in [place], diabetes has still been uncontrolled prior to that A1c and did not tolerate Jardiance .  Based on office reading today we will hold on insulin.  Check fructosamine for more recent readings/average.  Continue metformin , add low-dose Mounjaro with potential side effects and risks discussed.  Recheck in 2 weeks with home readings.  RTC precautions if new or worsening symptoms in the interim.  No orders of the defined types were placed in this encounter.  Patient Instructions  Blood sugar was unfortunately much higher on your most recent 30-month test.  Some of that could be from missing some doses of medication when you were in [place], but again readings were still elevated at last visit.  I will check a lab called fructosamine which looks at more recent averages of blood sugar.  Continue metformin  2 pills/day.  Add Ozempic injection once per week for now.  As blood sugars improved we can decrease medication but at this point we need to get the levels down.   Over the next 2 weeks please check your blood sugars fasting and 2 hours after meals.  1 or 2 readings per day and bring those results into office for your next appointment to help decide on other changes.  Semaglutide Injection What is this medication? SEMAGLUTIDE (SEM a GLOO tide) treats type 2 diabetes. It works by increasing insulin levels in your body, which decreases your blood sugar (glucose). It also reduces the amount of sugar released into your blood and slows down your digestion. It may also be used to lower the risk of worsening disease and death caused  by heart or kidney disease in people with type 2 diabetes. Changes to diet and exercise  are often combined with this medication. This medicine may be used for other purposes; ask your health care provider or pharmacist if you have questions. COMMON BRAND NAME(S): OZEMPIC What should I tell my care team before I take this medication? They need to know if you have any of these conditions: Eye disease caused by diabetes Gallbladder disease Have or have had pancreatitis Having surgery Kidney disease Personal or family history of MEN 2, a condition that causes endocrine gland tumors Personal or family history of thyroid cancer Stomach or intestine problems, such as problems digesting food An unusual or allergic reaction to semaglutide, other medications, foods, dyes, or preservatives Pregnant or trying to get pregnant Breastfeeding How should I use this medication? This medication is for injection under the skin of your upper leg (thigh), stomach area, or upper arm. It is given once every week (every 7 days). You will be taught how to prepare and give this medication. Use exactly as directed. Take your medication at regular intervals. Do not take it more often than directed. If you use this medication with insulin, you should inject this medication and the insulin separately. Do not mix them together. Do not give the injections right next to each other. Change (rotate) injection sites with each injection. It is important that you put your used needles and syringes in a special sharps container. Do not put them in a trash can. If you do not have a sharps container, call your pharmacist or care team to get one. A special MedGuide will be given to you by the pharmacist with each prescription and refill. Be sure to read this information carefully each time. This medication comes with INSTRUCTIONS FOR USE. Ask your pharmacist for directions on how to use this medication. Read the information carefully. Talk to your pharmacist or care team if you have questions. Talk to your care team about  the use of this medication in children. Special care may be needed. Overdosage: If you think you have taken too much of this medicine contact a poison control center or emergency room at once. NOTE: This medicine is only for you. Do not share this medicine with others. What if I miss a dose? If you miss a dose, take it as soon as you can within 5 days after the missed dose. Then take your next dose at your regular weekly time. If it has been longer than 5 days after the missed dose, do not take the missed dose. Take the next dose at your regular time. Do not take double or extra doses. If you have questions about a missed dose, contact your care team for advice. What may interact with this medication? Some medications may affect your blood sugar levels or hide the symptoms of low blood sugar (hypoglycemia). Talk with your care team about all the medications you take. They may suggest changes to your insulin dose or checking your blood sugar levels more often. Medications that may affect your blood sugar levels include: Alcohol Certain antibiotics, such as ciprofloxacin, levofloxacin, sulfamethoxazole; trimethoprim Certain medications for blood pressure or heart disease, such as benazepril, enalapril, lisinopril , losartan, valsartan Certain medications for mental health conditions, such as fluoxetine or olanzapine Diuretics, such as hydrochlorothiazide  (HCTZ) Estrogen and progestin hormones Other medications for diabetes Steroid medications, such as prednisone or cortisone Testosterone Thyroid hormones Medications that may mask symptoms of low blood sugar include: Beta blockers,  such as atenolol, metoprolol, propranolol Clonidine Guanethidine Reserpine This list may not describe all possible interactions. Give your health care provider a list of all the medicines, herbs, non-prescription drugs, or dietary supplements you use. Also tell them if you smoke, drink alcohol, or use illegal drugs.  Some items may interact with your medicine. What should I watch for while using this medication? Visit your care team for regular checks on your progress. Tell your care team if your symptoms do not start to get better or if they get worse. You may need blood work done while you are taking this medication. Your care team will monitor your HbA1C (A1C). This test shows what your average blood sugar (glucose) level was over the past 2 to 3 months. Know the symptoms of low blood sugar and know how to treat it. Always carry a source of quick sugar with you. Examples include hard sugar candy or glucose tablets. Make sure others know that you can choke if you eat or drink if your blood sugar is too low and you are unable to care for yourself. Get medical help at once. Tell your care team if you have high blood sugar. Your medication dose may change if your body is under stress. Some types of stress that may affect your blood sugar include fever, infection, and surgery. Do not share pens or cartridges with anyone, even if the needle is changed. Each pen should only be used by one person. Sharing could cause an infection. Wear a medical ID bracelet or chain. Carry a card that describes your condition. List the medications and doses you take on the card. Talk to your care team about your risk of cancer. You may be more at risk for certain types of cancer if you take this medication. Talk to your care team right away if you have a lump or swelling in your neck, hoarseness that does not go away, trouble swallowing, shortness of breath, or trouble breathing. Make sure you stay hydrated while taking this medication. Drink water often. Eat fruits and veggies that have a high water content. Drink more water when it is hot or you are active. Talk to your care team right away if you have fever, infection, vomiting, diarrhea, or if you sweat a lot while taking this medication. The loss of too much body fluid may make it  dangerous for you to take this medication. If you are going to need surgery or a procedure, tell your care team that you are taking this medication. Do not take this medication without first talking to your care team if you may be or could become pregnant. Your care team can help you find the option that works for you. Weight loss is not recommended during pregnancy. Maintaining healthy blood sugar levels can help reduce the risk of pregnancy complications. Talk to your care team if you are breastfeeding. When recommended, this medication may be taken. Its use during breastfeeding has not been well studied. Lactation may help lower your blood sugar levels. Your care team may recommend changes to your treatment plan. What side effects may I notice from receiving this medication? Side effects that you should report to your care team as soon as possible: Allergic reactions--skin rash, itching, hives, swelling of the face, lips, tongue, or throat Change in vision Dehydration--increased thirst, dry mouth, feeling faint or lightheaded, headache, dark yellow or brown urine Fast or irregular heartbeat Gallbladder problems--severe stomach pain, nausea, vomiting, fever Kidney injury--decrease in the amount of  urine, swelling of the ankles, hands, or feet Pancreatitis--severe stomach pain that spreads to your back or gets worse after eating or when touched, fever, nausea, vomiting Thyroid cancer--new mass or lump in the neck, pain or trouble swallowing, trouble breathing, hoarseness Side effects that usually do not require medical attention (report these to your care team if they continue or are bothersome): Constipation Diarrhea Loss of appetite Nausea Upset stomach This list may not describe all possible side effects. Call your doctor for medical advice about side effects. You may report side effects to FDA at 1-800-FDA-1088. Where should I keep my medication? Keep out of the reach of children. Store  unopened pens in a refrigerator between 2 and 8 degrees C (36 and 46 degrees F). Do not freeze. Protect from light and heat. After you first use the pen, it can be stored for 56 days at room temperature between 15 and 30 degrees C (59 and 86 degrees F) or in a refrigerator. Throw away your used pen after 56 days or after the expiration date, whichever comes first. Do not store your pen with the needle attached. If the needle is left on, medication may leak from the pen. NOTE: This sheet is a summary. It may not cover all possible information. If you have questions about this medicine, talk to your doctor, pharmacist, or health care provider.  2025 Elsevier/Gold Standard (2023-08-27 00:00:00)    Signed,   Reyes Pines, MD [facility], [facility], [facility] 06/24/24 8:26 AM

## 2024-06-28 LAB — FRUCTOSAMINE: Fructosamine: 449 umol/L — ABNORMAL HIGH (ref 205–285)

## 2024-06-29 ENCOUNTER — Ambulatory Visit: Admitting: Family Medicine

## 2024-06-30 ENCOUNTER — Ambulatory Visit: Payer: Self-pay | Admitting: Family Medicine

## 2024-07-06 NOTE — Progress Notes (Signed)
 Lab results have been discussed.   Verbalized understanding? Yes  Are there any questions? NO

## 2024-07-09 ENCOUNTER — Encounter: Payer: Self-pay | Admitting: Family Medicine

## 2024-07-09 ENCOUNTER — Ambulatory Visit: Admitting: Family Medicine

## 2024-07-09 VITALS — BP 122/80 | HR 78 | Temp 98.2°F | Ht 63.0 in | Wt 178.0 lb

## 2024-07-09 DIAGNOSIS — E1165 Type 2 diabetes mellitus with hyperglycemia: Secondary | ICD-10-CM

## 2024-07-09 DIAGNOSIS — Z7985 Long-term (current) use of injectable non-insulin antidiabetic drugs: Secondary | ICD-10-CM | POA: Diagnosis not present

## 2024-07-09 LAB — GLUCOSE, POCT (MANUAL RESULT ENTRY): POC Glucose: 101 mg/dL — AB (ref 70–99)

## 2024-07-09 NOTE — Patient Instructions (Addendum)
 Glad to hear that you are tolerating the new medication.  Sounds like the blood sugars are also getting better.  We can stay at the 2.5 mg of Mounjaro  for now but if you have any readings in the 200s, I would like to increase that dose slightly.  Let me know.  If you have any low readings or side effects with that medication, let me know as well and we can make some adjustments.  Follow-up in 10 weeks and we can repeat hemoglobin A1c at that time as long as home blood sugar readings are stable.  If any issues in that time with high or low readings, or new symptoms, please see me sooner.  Take care!

## 2024-07-09 NOTE — Progress Notes (Signed)
 Subjective:  Patient ID: Ricky Diaz, male    DOB: 31-Jan-1976  Age: 48 y.o. MRN: [id number]  CC:  Chief Complaint  Patient presents with   Diabetes    Pt is well no questions     HPI Ricky Diaz presents for   Diabetes: Complicated by hyperglycemia, elevated A1c from 8.6-11.7 from August to November.  He had been on extended release metformin  previously, added Jardiance  in September for elevated readings at that time but did not tolerate Jardiance .  Reported dizziness without medication.  He did report missing a few doses of his metformin  when in [place] in October through November, but only a few missed doses.  Readings of 111-159 when discussed December 3.  In office glucose 189.  Added low-dose Mounjaro , continue metformin .  Fructosamine was still elevated.  Currently on Mounjaro  2.5 mg daily, metformin  1000 mg twice daily. 2 doses of mounjaro  so far - no side effects - no n/v/abd pain or constipation.  Home readings: Fasting: 90-120 Postprandial: 120-155.  No 200's. No sx lows - lowest 90.   Lab Results  Component Value Date   HGBA1C 11.7 (H) 06/15/2024   HGBA1C 8.6 (H) 03/11/2024   HGBA1C 7.8 (H) 11/27/2023   Lab Results  Component Value Date   MICROALBUR 1.1 10/10/2023   LDLCALC 62 03/11/2024   CREATININE 0.96 06/15/2024    History Patient Active Problem List   Diagnosis Date Noted   Type 2 diabetes mellitus without complication, without long-term current use of insulin (HCC) 11/12/2022   LFT elevation 11/12/2022   Primary hyperaldosteronism 06/23/2015   Sleep difficulties    Hypokalemia 06/09/2014   Essential hypertension 08/06/2012   Past Medical History:  Diagnosis Date   Diabetes mellitus without complication (HCC)    Glucose intolerance (impaired glucose tolerance)    Hyperlipidemia    Hypertension    onset age 47.   OSA on CPAP 07/23/2012   Sleep apnea    Sleep difficulties    Past Surgical History:  Procedure Laterality Date   NO PAST  SURGERIES     Allergies[1] Prior to Admission medications  Medication Sig Start Date End Date Taking? Authorizing Provider  ACCU-CHEK GUIDE TEST test strip USE AS DIRECTED CHECK BLOOID SUGAR 1-2 TIMES PER DAY. 12/17/23  Yes Levora Reyes SAUNDERS, MD  amLODipine  (NORVASC ) 10 MG tablet Take 1 tablet (10 mg total) by mouth daily. 03/11/24  Yes Levora Reyes SAUNDERS, MD  atorvastatin  (LIPITOR) 10 MG tablet Take 1 tablet (10 mg total) by mouth daily. 03/11/24  Yes Levora Reyes SAUNDERS, MD  Blood Glucose Monitoring Suppl DEVI May substitute to any manufacturer covered by patient's insurance. Check 1-2 times per day. 09/20/23  Yes Levora Reyes SAUNDERS, MD  labetalol  (NORMODYNE ) 100 MG tablet Take 1 tablet (100 mg total) by mouth 2 (two) times daily. 03/11/24  Yes Levora Reyes SAUNDERS, MD  Lancet Device MISC May substitute to any manufacturer covered by patient's insurance. Check 1-2 times per day. 09/20/23  Yes Levora Reyes SAUNDERS, MD  Lancets Misc. MISC Check 1-2 times per day. May substitute to any manufacturer covered by patient's insurance. 09/20/23  Yes Levora Reyes SAUNDERS, MD  lisinopril  (ZESTRIL ) 10 MG tablet Take 1 tablet (10 mg total) by mouth daily. 03/11/24  Yes Levora Reyes SAUNDERS, MD  metFORMIN  (GLUCOPHAGE -XR) 500 MG 24 hr tablet Take 2 tablets (1,000 mg total) by mouth daily with breakfast. 03/11/24  Yes Levora Reyes SAUNDERS, MD  spironolactone  (ALDACTONE ) 25 MG tablet Take 1 tablet (25 mg  total) by mouth daily. 03/11/24  Yes Levora Reyes SAUNDERS, MD  tirzepatide  (MOUNJARO ) 2.5 MG/0.5ML Pen Inject 2.5 mg into the skin once a week. 06/24/24  Yes Levora Reyes SAUNDERS, MD   Social History   Socioeconomic History   Marital status: Married    Spouse name: [name]   Number of children: 4   Years of education: 14   Highest education level: Not on file  Occupational History    Employer: [employer]  Tobacco Use   Smoking status: Never   Smokeless tobacco: Never  Vaping Use   Vaping status: Never Used  Substance  and Sexual Activity   Alcohol use: Yes    Comment: rare/social   Drug use: No   Sexual activity: Yes    Partners: Female  Other Topics Concern   Not on file  Social History Narrative   Marital status: married x 2007. From [place]; [place]  since 2000.      Children: 4 children; no grandchildren.      Lives: with wife ([name]) , children.      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      Tobacco: none      Alcohol: none      Drugs: none      Exercise:  Sporadic.   Patient is right-handed.   Patient has a college education.   Patient no longer drinks caffeine   Social Drivers of Health   Tobacco Use: Low Risk (06/24/2024)   Patient History    Smoking Tobacco Use: Never    Smokeless Tobacco Use: Never    Passive Exposure: Not on file  Financial Resource Strain: Not on file  Food Insecurity: Not on file  Transportation Needs: Not on file  Physical Activity: Not on file  Stress: Not on file  Social Connections: Not on file  Intimate Partner Violence: Not on file  Depression (PHQ2-9): Low Risk (06/15/2024)   Depression (PHQ2-9)    PHQ-2 Score: 1  Alcohol Screen: Not on file  Housing: Not on file  Utilities: Not on file  Health Literacy: Not on file    Review of Systems   Objective:   Vitals:   07/09/24 1107  BP: 122/80  Pulse: 78  Temp: 98.2 F (36.8 C)  TempSrc: Temporal  SpO2: 98%  Weight: 178 lb (80.7 kg)  Height: 5' 3 (1.6 m)     Physical Exam Vitals reviewed.  Constitutional:      Appearance: He is well-developed.  HENT:     Head: Normocephalic and atraumatic.  Neck:     Vascular: No carotid bruit or JVD.  Cardiovascular:     Rate and Rhythm: Normal rate and regular rhythm.     Heart sounds: Normal heart sounds. No murmur heard. Pulmonary:     Effort: Pulmonary effort is normal.     Breath sounds: Normal breath sounds. No rales.  Musculoskeletal:     Right lower leg: No edema.     Left lower leg: No edema.  Skin:    General:  Skin is warm and dry.  Neurological:     Mental Status: He is alert and oriented to person, place, and time.  Psychiatric:        Mood and Affect: Mood normal.    Results for orders placed or performed in visit on 07/09/24  POCT glucose (manual entry)   Collection Time: 07/09/24 11:36 AM  Result Value Ref Range   POC Glucose 101 (A) 70 -  99 mg/dl      Assessment & Plan:  Blakeley Rodeheaver is a 48 y.o. male . Type 2 diabetes mellitus with hyperglycemia, without long-term current use of insulin (HCC) - Plan: POCT glucose (manual entry) Improving control, home readings have improved, glucose in office 101, consistent.  No symptomatic lows, no new side effects with Mounjaro .  Will continue low-dose for now along with metformin .  If he does start to have lower readings we could hold Mounjaro  temporarily but based on most recent A1c and prior trend suspect he will need to remain on that medication.  Will follow-up in approximately 10 weeks for repeat A1c with RTC precautions given in the interim if high or low readings or new side effects with that medication.  Understanding expressed.  No orders of the defined types were placed in this encounter.  Patient Instructions  Glad to hear that you are tolerating the new medication.  Sounds like the blood sugars are also getting better.  We can stay at the 2.5 mg of Mounjaro  for now but if you have any readings in the 200s, I would like to increase that dose slightly.  Let me know.  If you have any low readings or side effects with that medication, let me know as well and we can make some adjustments.  Follow-up in 10 weeks and we can repeat hemoglobin A1c at that time as long as home blood sugar readings are stable.  If any issues in that time with high or low readings, or new symptoms, please see me sooner.  Take care!    Signed,   Reyes Pines, MD  Primary Care, [facility] 07/09/2024 11:37 AM       [1]  Allergies Allergen Reactions   Jardiance  [Empagliflozin ] Other (See Comments)    Lightheaded, dizzy, felt weak after 1 dose.

## 2024-08-19 ENCOUNTER — Other Ambulatory Visit: Payer: Self-pay | Admitting: Family Medicine

## 2024-08-19 DIAGNOSIS — E1165 Type 2 diabetes mellitus with hyperglycemia: Secondary | ICD-10-CM

## 2024-08-21 ENCOUNTER — Other Ambulatory Visit: Payer: Self-pay | Admitting: Family Medicine

## 2024-08-21 ENCOUNTER — Other Ambulatory Visit: Payer: Self-pay

## 2024-08-21 DIAGNOSIS — E1165 Type 2 diabetes mellitus with hyperglycemia: Secondary | ICD-10-CM

## 2024-08-21 DIAGNOSIS — E119 Type 2 diabetes mellitus without complications: Secondary | ICD-10-CM

## 2024-08-21 MED ORDER — METFORMIN HCL ER 500 MG PO TB24: 1000.0000 mg | ORAL_TABLET | Freq: Every day | ORAL | 1 refills | Status: AC

## 2024-09-17 ENCOUNTER — Encounter: Admitting: Family Medicine

## 2024-12-16 ENCOUNTER — Ambulatory Visit: Admitting: Adult Health
